# Patient Record
Sex: Female | Born: 1935
Health system: Southern US, Community
[De-identification: ages and names within clinical notes are randomized; demographics above are authoritative.]

## PROBLEM LIST (undated history)

## (undated) DIAGNOSIS — M1990 Unspecified osteoarthritis, unspecified site: Secondary | ICD-10-CM

## (undated) DIAGNOSIS — F329 Major depressive disorder, single episode, unspecified: Secondary | ICD-10-CM

## (undated) DIAGNOSIS — E785 Hyperlipidemia, unspecified: Secondary | ICD-10-CM

## (undated) DIAGNOSIS — M810 Age-related osteoporosis without current pathological fracture: Secondary | ICD-10-CM

## (undated) DIAGNOSIS — S92901A Unspecified fracture of right foot, initial encounter for closed fracture: Secondary | ICD-10-CM

## (undated) DIAGNOSIS — I10 Essential (primary) hypertension: Secondary | ICD-10-CM

## (undated) DIAGNOSIS — M419 Scoliosis, unspecified: Secondary | ICD-10-CM

## (undated) DIAGNOSIS — J309 Allergic rhinitis, unspecified: Secondary | ICD-10-CM

## (undated) DIAGNOSIS — F32A Depression, unspecified: Secondary | ICD-10-CM

## (undated) DIAGNOSIS — Z8739 Personal history of other diseases of the musculoskeletal system and connective tissue: Secondary | ICD-10-CM

## (undated) HISTORY — PX: TUBAL LIGATION: SHX77

## (undated) HISTORY — DX: Scoliosis, unspecified: M41.9

## (undated) HISTORY — DX: Unspecified osteoarthritis, unspecified site: M19.90

## (undated) HISTORY — DX: Unspecified fracture of right foot, initial encounter for closed fracture: S92.901A

## (undated) HISTORY — DX: Hyperlipidemia, unspecified: E78.5

## (undated) HISTORY — DX: Personal history of other diseases of the musculoskeletal system and connective tissue: Z87.39

## (undated) HISTORY — PX: OTHER SURGICAL HISTORY: SHX169

## (undated) HISTORY — DX: Allergic rhinitis, unspecified: J30.9

## (undated) HISTORY — DX: Essential (primary) hypertension: I10

## (undated) HISTORY — DX: Depression, unspecified: F32.A

## (undated) HISTORY — DX: Age-related osteoporosis without current pathological fracture: M81.0

## (undated) HISTORY — DX: Major depressive disorder, single episode, unspecified: F32.9

---

## 1997-12-02 ENCOUNTER — Ambulatory Visit (HOSPITAL_COMMUNITY): Admission: RE | Admit: 1997-12-02 | Discharge: 1997-12-02 | Payer: Self-pay | Admitting: *Deleted

## 1998-01-18 ENCOUNTER — Ambulatory Visit (HOSPITAL_COMMUNITY): Admission: RE | Admit: 1998-01-18 | Discharge: 1998-01-18 | Payer: Self-pay

## 1998-11-24 ENCOUNTER — Ambulatory Visit (HOSPITAL_COMMUNITY): Admission: RE | Admit: 1998-11-24 | Discharge: 1998-11-24 | Payer: Self-pay | Admitting: *Deleted

## 1999-11-30 ENCOUNTER — Encounter: Payer: Self-pay | Admitting: Family Medicine

## 1999-11-30 ENCOUNTER — Ambulatory Visit (HOSPITAL_COMMUNITY): Admission: RE | Admit: 1999-11-30 | Discharge: 1999-11-30 | Payer: Self-pay | Admitting: Family Medicine

## 2000-12-05 ENCOUNTER — Ambulatory Visit (HOSPITAL_COMMUNITY): Admission: RE | Admit: 2000-12-05 | Discharge: 2000-12-05 | Payer: Self-pay | Admitting: *Deleted

## 2001-12-11 ENCOUNTER — Ambulatory Visit (HOSPITAL_COMMUNITY): Admission: RE | Admit: 2001-12-11 | Discharge: 2001-12-11 | Payer: Self-pay | Admitting: Obstetrics & Gynecology

## 2002-12-17 ENCOUNTER — Ambulatory Visit (HOSPITAL_COMMUNITY): Admission: RE | Admit: 2002-12-17 | Discharge: 2002-12-17 | Payer: Self-pay | Admitting: *Deleted

## 2002-12-17 ENCOUNTER — Encounter (INDEPENDENT_AMBULATORY_CARE_PROVIDER_SITE_OTHER): Payer: Self-pay | Admitting: *Deleted

## 2004-03-01 ENCOUNTER — Ambulatory Visit: Payer: Self-pay | Admitting: Family Medicine

## 2004-05-08 ENCOUNTER — Ambulatory Visit: Payer: Self-pay | Admitting: Family Medicine

## 2004-05-09 ENCOUNTER — Ambulatory Visit: Payer: Self-pay | Admitting: Family Medicine

## 2004-06-05 ENCOUNTER — Ambulatory Visit: Payer: Self-pay | Admitting: Family Medicine

## 2004-06-27 ENCOUNTER — Ambulatory Visit: Payer: Self-pay | Admitting: Family Medicine

## 2004-07-19 ENCOUNTER — Ambulatory Visit: Payer: Self-pay | Admitting: Family Medicine

## 2004-08-07 ENCOUNTER — Ambulatory Visit: Payer: Self-pay | Admitting: Family Medicine

## 2004-08-10 ENCOUNTER — Ambulatory Visit: Payer: Self-pay | Admitting: Family Medicine

## 2004-11-06 ENCOUNTER — Ambulatory Visit: Payer: Self-pay | Admitting: Family Medicine

## 2005-02-06 ENCOUNTER — Ambulatory Visit: Payer: Self-pay | Admitting: Family Medicine

## 2005-02-12 ENCOUNTER — Ambulatory Visit: Payer: Self-pay | Admitting: Family Medicine

## 2005-02-20 ENCOUNTER — Ambulatory Visit: Payer: Self-pay | Admitting: Family Medicine

## 2005-06-25 ENCOUNTER — Other Ambulatory Visit: Admission: RE | Admit: 2005-06-25 | Discharge: 2005-06-25 | Payer: Self-pay | Admitting: Family Medicine

## 2005-06-25 ENCOUNTER — Encounter: Payer: Self-pay | Admitting: Family Medicine

## 2005-06-25 ENCOUNTER — Ambulatory Visit: Payer: Self-pay | Admitting: Family Medicine

## 2005-06-25 LAB — CONVERTED CEMR LAB: Pap Smear: NORMAL

## 2005-08-06 ENCOUNTER — Ambulatory Visit: Payer: Self-pay | Admitting: Family Medicine

## 2005-08-27 ENCOUNTER — Ambulatory Visit: Payer: Self-pay | Admitting: Family Medicine

## 2005-08-28 ENCOUNTER — Ambulatory Visit: Payer: Self-pay | Admitting: Family Medicine

## 2005-08-28 LAB — HM MAMMOGRAPHY: HM Mammogram: NORMAL

## 2005-10-08 ENCOUNTER — Ambulatory Visit: Payer: Self-pay | Admitting: Family Medicine

## 2005-11-14 ENCOUNTER — Ambulatory Visit: Payer: Self-pay | Admitting: Family Medicine

## 2005-11-19 ENCOUNTER — Ambulatory Visit: Payer: Self-pay | Admitting: Family Medicine

## 2006-05-13 ENCOUNTER — Ambulatory Visit: Payer: Self-pay | Admitting: Family Medicine

## 2006-05-13 LAB — CONVERTED CEMR LAB
ALT: 32 units/L (ref 0–40)
AST: 28 units/L (ref 0–37)
Cholesterol: 177 mg/dL (ref 0–200)
HDL: 45.3 mg/dL (ref >39.0)
Hgb A1c MFr Bld: 7.2 %
Hgb A1c MFr Bld: 7.2 % — ABNORMAL HIGH (ref 4.6–6.0)
LDL Cholesterol: 113 mg/dL — ABNORMAL HIGH (ref 0–99)
Total CHOL/HDL Ratio: 3.9
Triglycerides: 95 mg/dL (ref 0–149)
VLDL: 19 mg/dL (ref 0–40)

## 2006-05-20 ENCOUNTER — Ambulatory Visit: Payer: Self-pay | Admitting: Family Medicine

## 2006-06-19 ENCOUNTER — Ambulatory Visit: Payer: Self-pay | Admitting: Family Medicine

## 2006-06-19 LAB — CONVERTED CEMR LAB
ALT: 27 units/L (ref 0–40)
AST: 23 units/L (ref 0–37)
Albumin: 3.7 g/dL (ref 3.5–5.2)
Alkaline Phosphatase: 47 units/L (ref 39–117)
Bilirubin, Direct: 0.1 mg/dL (ref 0.0–0.3)
Glucose, Bld: 78 mg/dL (ref 70–99)
Total Bilirubin: 0.7 mg/dL (ref 0.3–1.2)
Total Protein: 6.7 g/dL (ref 6.0–8.3)

## 2006-07-28 ENCOUNTER — Encounter: Payer: Self-pay | Admitting: Family Medicine

## 2006-07-28 DIAGNOSIS — B009 Herpesviral infection, unspecified: Secondary | ICD-10-CM | POA: Insufficient documentation

## 2006-07-28 DIAGNOSIS — M797 Fibromyalgia: Secondary | ICD-10-CM

## 2006-07-28 DIAGNOSIS — IMO0002 Reserved for concepts with insufficient information to code with codable children: Secondary | ICD-10-CM

## 2006-07-28 DIAGNOSIS — I10 Essential (primary) hypertension: Secondary | ICD-10-CM | POA: Insufficient documentation

## 2006-07-28 DIAGNOSIS — M4120 Other idiopathic scoliosis, site unspecified: Secondary | ICD-10-CM | POA: Insufficient documentation

## 2006-07-28 DIAGNOSIS — A6920 Lyme disease, unspecified: Secondary | ICD-10-CM

## 2006-07-28 DIAGNOSIS — F329 Major depressive disorder, single episode, unspecified: Secondary | ICD-10-CM

## 2006-07-28 DIAGNOSIS — E119 Type 2 diabetes mellitus without complications: Secondary | ICD-10-CM

## 2006-07-28 DIAGNOSIS — E785 Hyperlipidemia, unspecified: Secondary | ICD-10-CM | POA: Insufficient documentation

## 2006-07-28 DIAGNOSIS — J45909 Unspecified asthma, uncomplicated: Secondary | ICD-10-CM | POA: Insufficient documentation

## 2006-07-28 DIAGNOSIS — R945 Abnormal results of liver function studies: Secondary | ICD-10-CM

## 2006-07-28 DIAGNOSIS — N318 Other neuromuscular dysfunction of bladder: Secondary | ICD-10-CM

## 2006-07-28 DIAGNOSIS — M790 Rheumatism, unspecified: Secondary | ICD-10-CM | POA: Insufficient documentation

## 2006-07-28 DIAGNOSIS — M1990 Unspecified osteoarthritis, unspecified site: Secondary | ICD-10-CM | POA: Insufficient documentation

## 2006-08-20 ENCOUNTER — Ambulatory Visit: Payer: Self-pay | Admitting: Family Medicine

## 2006-08-20 DIAGNOSIS — T887XXA Unspecified adverse effect of drug or medicament, initial encounter: Secondary | ICD-10-CM

## 2006-08-21 LAB — CONVERTED CEMR LAB
AST: 28 units/L (ref 0–37)
Albumin: 3.7 g/dL (ref 3.5–5.2)
BUN: 8 mg/dL (ref 6–23)
CO2: 31 meq/L (ref 19–32)
Chloride: 109 meq/L (ref 96–112)
Cholesterol: 180 mg/dL (ref 0–200)
Creatinine, Ser: 0.8 mg/dL (ref 0.4–1.2)
GFR calc non Af Amer: 76 mL/min
Hgb A1c MFr Bld: 6.7 % — ABNORMAL HIGH (ref 4.6–6.0)
Phosphorus: 4.3 mg/dL (ref 2.3–4.6)
Total CHOL/HDL Ratio: 4.1
Triglycerides: 103 mg/dL (ref 0–149)

## 2007-02-11 ENCOUNTER — Ambulatory Visit: Payer: Self-pay | Admitting: Family Medicine

## 2007-02-12 LAB — CONVERTED CEMR LAB
AST: 26 units/L (ref 0–37)
Albumin: 4.1 g/dL (ref 3.5–5.2)
BUN: 11 mg/dL (ref 6–23)
Basophils Absolute: 0 10*3/uL (ref 0.0–0.1)
Bilirubin, Direct: 0.2 mg/dL (ref 0.0–0.3)
CO2: 28 meq/L (ref 19–32)
Calcium: 9.9 mg/dL (ref 8.4–10.5)
Chloride: 107 meq/L (ref 96–112)
Cholesterol: 189 mg/dL (ref 0–200)
Creatinine, Ser: 0.8 mg/dL (ref 0.4–1.2)
Eosinophils Absolute: 0.1 10*3/uL (ref 0.0–0.6)
GFR calc non Af Amer: 76 mL/min
HCT: 37.3 % (ref 36.0–46.0)
HDL: 46 mg/dL (ref >39.0)
Hemoglobin: 13 g/dL (ref 12.0–15.0)
Hgb A1c MFr Bld: 6.2 % — ABNORMAL HIGH (ref 4.6–6.0)
Lymphocytes Relative: 26.6 % (ref 12.0–46.0)
MCHC: 34.8 g/dL (ref 30.0–36.0)
MCV: 89.5 fL (ref 78.0–100.0)
Monocytes Absolute: 0.4 10*3/uL (ref 0.2–0.7)
Monocytes Relative: 6.1 % (ref 3.0–11.0)
Neutro Abs: 4.1 10*3/uL (ref 1.4–7.7)
Neutrophils Relative %: 65.7 % (ref 43.0–77.0)
Phosphorus: 3.9 mg/dL (ref 2.3–4.6)

## 2007-02-18 ENCOUNTER — Telehealth: Payer: Self-pay | Admitting: Family Medicine

## 2007-08-12 ENCOUNTER — Ambulatory Visit: Payer: Self-pay | Admitting: Family Medicine

## 2007-08-12 DIAGNOSIS — J309 Allergic rhinitis, unspecified: Secondary | ICD-10-CM | POA: Insufficient documentation

## 2007-08-13 LAB — CONVERTED CEMR LAB
ALT: 30 units/L (ref 0–35)
AST: 29 units/L (ref 0–37)
BUN: 8 mg/dL (ref 6–23)
Calcium: 10.1 mg/dL (ref 8.4–10.5)
Cholesterol: 153 mg/dL (ref 0–200)
GFR calc Af Amer: 91 mL/min
Glucose, Bld: 119 mg/dL — ABNORMAL HIGH (ref 70–99)
HDL: 38.7 mg/dL — ABNORMAL LOW (ref >39.0)
Hgb A1c MFr Bld: 6.7 % — ABNORMAL HIGH (ref 4.6–6.0)
Phosphorus: 3.6 mg/dL (ref 2.3–4.6)
Sodium: 140 meq/L (ref 135–145)
Total Bilirubin: 0.9 mg/dL (ref 0.3–1.2)
Total Protein: 7 g/dL (ref 6.0–8.3)

## 2008-02-12 ENCOUNTER — Ambulatory Visit: Payer: Self-pay | Admitting: Family Medicine

## 2008-02-15 LAB — CONVERTED CEMR LAB
BUN: 9 mg/dL (ref 6–23)
Basophils Absolute: 0 10*3/uL (ref 0.0–0.1)
Bilirubin, Direct: 0.1 mg/dL (ref 0.0–0.3)
CO2: 30 meq/L (ref 19–32)
Chloride: 105 meq/L (ref 96–112)
Eosinophils Absolute: 0.1 10*3/uL (ref 0.0–0.7)
GFR calc Af Amer: 91 mL/min
Glucose, Bld: 121 mg/dL — ABNORMAL HIGH (ref 70–99)
HCT: 38.3 % (ref 36.0–46.0)
MCV: 90.7 fL (ref 78.0–100.0)
Monocytes Absolute: 0.4 10*3/uL (ref 0.1–1.0)
Platelets: 209 10*3/uL (ref 150–400)
Potassium: 4.4 meq/L (ref 3.5–5.1)
RDW: 13 % (ref 11.5–14.6)
TSH: 0.64 microintl units/mL (ref 0.35–5.50)
Total Bilirubin: 0.8 mg/dL (ref 0.3–1.2)

## 2008-06-23 ENCOUNTER — Telehealth: Payer: Self-pay | Admitting: Family Medicine

## 2008-08-09 ENCOUNTER — Ambulatory Visit: Payer: Self-pay | Admitting: Family Medicine

## 2008-08-12 LAB — CONVERTED CEMR LAB
Albumin: 3.9 g/dL (ref 3.5–5.2)
Alkaline Phosphatase: 44 units/L (ref 39–117)
Calcium: 10.1 mg/dL (ref 8.4–10.5)
Cholesterol: 179 mg/dL (ref 0–200)
Glucose, Bld: 110 mg/dL — ABNORMAL HIGH (ref 70–99)
HDL: 50.7 mg/dL (ref >39.00)
Potassium: 4.6 meq/L (ref 3.5–5.1)
Total Protein: 7 g/dL (ref 6.0–8.3)
VLDL: 15.6 mg/dL (ref 0.0–40.0)

## 2009-01-03 ENCOUNTER — Encounter (INDEPENDENT_AMBULATORY_CARE_PROVIDER_SITE_OTHER): Payer: Self-pay | Admitting: *Deleted

## 2009-04-10 ENCOUNTER — Ambulatory Visit: Payer: Self-pay | Admitting: Family Medicine

## 2009-04-11 LAB — CONVERTED CEMR LAB
Albumin: 3.7 g/dL (ref 3.5–5.2)
BUN: 11 mg/dL (ref 6–23)
Cholesterol: 181 mg/dL (ref 0–200)
GFR calc non Af Amer: 87.57 mL/min (ref >60)
Glucose, Bld: 126 mg/dL — ABNORMAL HIGH (ref 70–99)
LDL Cholesterol: 120 mg/dL — ABNORMAL HIGH (ref 0–99)
Phosphorus: 3.7 mg/dL (ref 2.3–4.6)
Total CHOL/HDL Ratio: 4
Triglycerides: 66 mg/dL (ref 0.0–149.0)
VLDL: 13.2 mg/dL (ref 0.0–40.0)

## 2009-07-18 ENCOUNTER — Telehealth: Payer: Self-pay | Admitting: Family Medicine

## 2009-10-04 ENCOUNTER — Ambulatory Visit: Payer: Self-pay | Admitting: Family Medicine

## 2009-10-05 LAB — CONVERTED CEMR LAB
ALT: 27 units/L (ref 0–35)
AST: 29 units/L (ref 0–37)
Albumin: 4.2 g/dL (ref 3.5–5.2)
CO2: 29 meq/L (ref 19–32)
Calcium: 10.6 mg/dL — ABNORMAL HIGH (ref 8.4–10.5)
Chloride: 107 meq/L (ref 96–112)
HDL: 51.3 mg/dL (ref >39.00)
LDL Cholesterol: 119 mg/dL — ABNORMAL HIGH (ref 0–99)
Phosphorus: 4.2 mg/dL (ref 2.3–4.6)
Potassium: 4.7 meq/L (ref 3.5–5.1)
Total CHOL/HDL Ratio: 4
VLDL: 17.4 mg/dL (ref 0.0–40.0)

## 2009-10-16 ENCOUNTER — Ambulatory Visit: Payer: Self-pay | Admitting: Family Medicine

## 2010-01-15 ENCOUNTER — Ambulatory Visit: Payer: Self-pay | Admitting: Family Medicine

## 2010-02-12 ENCOUNTER — Telehealth: Payer: Self-pay | Admitting: Family Medicine

## 2010-03-13 ENCOUNTER — Telehealth: Payer: Self-pay | Admitting: Family Medicine

## 2010-03-26 ENCOUNTER — Telehealth: Payer: Self-pay | Admitting: Family Medicine

## 2010-03-28 ENCOUNTER — Telehealth: Payer: Self-pay | Admitting: Family Medicine

## 2010-03-29 ENCOUNTER — Encounter: Payer: Self-pay | Admitting: Family Medicine

## 2010-03-29 ENCOUNTER — Ambulatory Visit: Payer: Self-pay | Admitting: Family Medicine

## 2010-03-29 DIAGNOSIS — M79609 Pain in unspecified limb: Secondary | ICD-10-CM | POA: Insufficient documentation

## 2010-04-17 ENCOUNTER — Encounter: Payer: Self-pay | Admitting: Family Medicine

## 2010-04-17 ENCOUNTER — Telehealth: Payer: Self-pay | Admitting: Family Medicine

## 2010-05-01 ENCOUNTER — Telehealth: Payer: Self-pay | Admitting: Family Medicine

## 2010-05-12 ENCOUNTER — Encounter: Payer: Self-pay | Admitting: Family Medicine
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
## 2010-05-24 NOTE — Progress Notes (Signed)
Summary: regarding blood sugars  Phone Note Call from Patient Call back at Home Phone [phone]   Caller: Patient Call For: [name] Summary of Call: Pt states the glipizide is not controlling her blood sugar as well as it should.  Her morning readings have been running 179-190's.  Her lunch and evening readings are better.  She thinks she needs something else to take at night to keep her morning readings lower.  Also, she woke up this morning with shortness of breath and right arm pain. This episode lasted about 20 minutes.  Appt made with Dr. Reece Agar to evaluated this on thursday.  Pt knows to go to ER if this happens again. Initial call taken by: Lowella Petties CMA, AAMA,  March 26, 2010 3:20 PM  Follow-up for Phone Call        definitely go to ER if she has these symptoms again  have her please increase her glucotrol xl from 5 mg to 10 mg once daily in am  update me with how sugars are with this increased dose later in the week Follow-up by: [name],  March 26, 2010 4:07 PM  Additional Follow-up for Phone Call Additional follow up Details #1::        Patient notified as instructed by telephone. Lewanda Rife LPN  March 26, 2010 4:33 PM     New/Updated Medications: GLUCOTROL XL 5 MG XR24H-TAB (GLIPIZIDE) 2 by mouth once daily with first meal
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
## 2010-05-24 NOTE — Progress Notes (Signed)
Summary: form for test strips  Phone Note From Pharmacy   Caller: [facility] [address] [phone]* Summary of Call: [facility] has faxed a form that needs to be signed for pt's test strips.  Form is on your shelf. Initial call taken by: Lowella Petties CMA, AAMA,  April 17, 2010 3:42 PM  Follow-up for Phone Call        I am ok with once daily testing instead of two times a day  I changed this on form  Follow-up by: [name],  April 17, 2010 5:14 PM  Additional Follow-up for Phone Call Additional follow up Details #1::        completed form faxed to [phone] as instructed. form sent for scanning.Lewanda Rife LPN  April 18, 2010 2:52 PM

## 2010-05-24 NOTE — Medication Information (Signed)
Summary: Order for Diabetic Supplies  Order for Diabetic Supplies   Imported By: Maryln Gottron 04/24/2010 14:56:40  _____________________________________________________________________  External Attachment:    Type:   Image     Comment:   External Document

## 2010-05-24 NOTE — Progress Notes (Signed)
Summary: documentation needed for diabetic supplies  Phone Note From Pharmacy   [facility]er[facility] Summary of Call: Medicare is asking for documentation for pt's diabetic supplies.  Letter is on your shelf. Initial call taken by: Lowella Petties CMA, AAMA,  March 13, 2010 3:59 PM  Follow-up for Phone Call        please send last 6 mo of prog notes and labs  I put form in nurse IN box- thanks Follow-up by: [name],  March 14, 2010 8:12 AM  Additional Follow-up for Phone Call Additional follow up Details #1::        Printed and given to Rena to send with letter.  Lugene Fuquay CMA Duncan Dull)  March 14, 2010 9:28 AM   Faxed and sent for scanning.  Lugene Fuquay CMA Duncan Dull)  March 14, 2010 9:41 AM

## 2010-05-24 NOTE — Progress Notes (Signed)
Summary: thrush  Phone Note Call from Patient Call back at Home Phone [phone]   Caller: Patient Call For: [name] Summary of Call: Patient says that she has thrush in her mouth and that she has had problems with this in the past. She is asking if she could get magic mouth wash called in to [facility] on [address]  Initial call taken by: Melody Comas,  May 01, 2010 9:39 AM  Follow-up for Phone Call        generally I use nystatin instead of magic mouthwash -- it has the antifungal for thrush .... I will px written on EMR for call in for that , but if she does not want it let me know please follow up if this does not clear up  Follow-up by: [name],  May 01, 2010 10:36 AM  Additional Follow-up for Phone Call Additional follow up Details #1::        Patient notified as instructed by telephone. Pt said she would try Nystatin and if not better would call and make appt. Med sent electronically to [facility] Lewanda Rife LPN  May 01, 2010 5:36 PM     New/Updated Medications: NYSTATIN 100000 UNIT/ML SUSP (NYSTATIN) 1 teaspoon swish and swallow three times a day until thrush is clear Prescriptions: NYSTATIN 100000 UNIT/ML SUSP (NYSTATIN) 1 teaspoon swish and swallow three times a day until thrush is clear  #120cc x 0   Entered by:   Lewanda Rife LPN   Authorized by:   [name]   Signed by:   Lewanda Rife LPN on 14/78/2956   Method used:   Electronically to        [facility] [address] (retail)       [address]       [place], [place]  [address]       Ph: [phone]       Fax: [phone]   RxID:   [id number]

## 2010-05-24 NOTE — Progress Notes (Signed)
Summary: actos   Phone Note Call from Patient   Caller: Patient Call For: Sheila Part MD Summary of Call: Patient has been taking actos for about 3 years and she is now hearing that it can cause bladder cancer. She is asking if there is something else she can take in its place. Please advise. She uses [facility] on [address]  Initial call taken by: Melody Comas,  February 12, 2010 3:11 PM  Follow-up for Phone Call        I reviewed the data and there is a very slt increased risk -- is higher at higher doses and higher in people who have already had bladder cancer FDA is recommending withdrawing med from pts with hx of bladder cancer in past or present if she wants to change to something else - ask if she has ever had glipizide or amaryl or any injectible DM drugs and let me know  Follow-up by: Sheila Part MD,  February 12, 2010 3:38 PM  Additional Follow-up for Phone Call Additional follow up Details #1::        Patient notified as instructed by telephone. Pt wants to stop Actos and try something else. Pt has never taken Glipizide, or Amaryl or any injectible DM drug. Pt has only taken Actos and Metformin. Please advise. Lewanda Rife LPN  February 12, 2010 4:49 PM   lets try glucotrol xl  this can be more likely to cause low sugar so watch out for s/s of hypoglycemia (or high sugars) f/u march as planned px written on EMR for call in  Additional Follow-up by: Sheila Part MD,  February 12, 2010 4:56 PM    Additional Follow-up for Phone Call Additional follow up Details #2::    Patient notified as instructed by telephone. Medication phoned to [facility] pharmacy as instructed. Lewanda Rife LPN  February 12, 2010 5:07 PM   New/Updated Medications: GLUCOTROL XL 5 MG XR24H-TAB (GLIPIZIDE) 1 by mouth once daily with first meal  Prescriptions: GLUCOTROL XL 5 MG XR24H-TAB (GLIPIZIDE) 1 by mouth once daily with first meal  #30 x 11   Entered and Authorized by:   Sheila Part MD   Signed by:   Lewanda Rife LPN on 16/01/9603   Method used:   Telephoned to ...       [facility] [address]* (retail)       [address]       [place], [place]  [address]       Ph: [phone]       Fax: [phone]   RxID:   [id number]

## 2010-05-24 NOTE — Progress Notes (Signed)
Summary: form regarding diabetic supplies  Phone Note From Pharmacy   Caller: [facility] [address] [phone]* Summary of Call: [name] is asking for documentation regarding pt's diabetic supplies.  Form is on your shelf. Initial call taken by: Lowella Petties CMA,  July 18, 2009 11:32 AM  Follow-up for Phone Call        please send last 3 progress notes I put form in nurse in box  Follow-up by: [name],  July 18, 2009 1:21 PM  Additional Follow-up for Phone Call Additional follow up Details #1::        Faxed last three office notes including fax cover page, faxed a total of 20 pages including cover page to [phone]. Additional Follow-up by: Linde Gillis CMA Duncan Dull),  July 18, 2009 2:15 PM
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
## 2010-05-24 NOTE — Progress Notes (Signed)
Summary: documentation for diabetic supplies  Phone Note From Pharmacy   Caller: Patient Caller: [facility]  Summary of Call: Pharmacy is asking for more documentation for diabetic testing supplies.  Form is on your shelf. Initial call taken by: Lowella Petties CMA, AAMA,  March 28, 2010 9:25 AM  Follow-up for Phone Call        please send last 2 prog notes and last 2 phone notes and last labs  thanks  will put form back in IN box Follow-up by: [name],  March 28, 2010 9:37 AM  Additional Follow-up for Phone Call Additional follow up Details #1::        Faxed requested info to [phone] as instructed.Lewanda Rife LPN  March 29, 2010 3:20 PM

## 2010-05-30 ENCOUNTER — Encounter: Payer: Self-pay | Admitting: Family Medicine

## 2010-05-30 DIAGNOSIS — I10 Essential (primary) hypertension: Secondary | ICD-10-CM

## 2010-05-30 DIAGNOSIS — M419 Scoliosis, unspecified: Secondary | ICD-10-CM

## 2010-05-30 DIAGNOSIS — E119 Type 2 diabetes mellitus without complications: Secondary | ICD-10-CM | POA: Insufficient documentation

## 2010-05-30 DIAGNOSIS — J309 Allergic rhinitis, unspecified: Secondary | ICD-10-CM

## 2010-05-30 DIAGNOSIS — J45909 Unspecified asthma, uncomplicated: Secondary | ICD-10-CM | POA: Insufficient documentation

## 2010-05-30 DIAGNOSIS — M1990 Unspecified osteoarthritis, unspecified site: Secondary | ICD-10-CM

## 2010-05-30 DIAGNOSIS — E785 Hyperlipidemia, unspecified: Secondary | ICD-10-CM

## 2010-05-30 DIAGNOSIS — F329 Major depressive disorder, single episode, unspecified: Secondary | ICD-10-CM

## 2010-05-30 DIAGNOSIS — F32A Depression, unspecified: Secondary | ICD-10-CM

## 2010-07-03 ENCOUNTER — Encounter (INDEPENDENT_AMBULATORY_CARE_PROVIDER_SITE_OTHER): Payer: Self-pay | Admitting: *Deleted

## 2010-07-03 ENCOUNTER — Other Ambulatory Visit: Payer: Self-pay | Admitting: Family Medicine

## 2010-07-03 ENCOUNTER — Other Ambulatory Visit (INDEPENDENT_AMBULATORY_CARE_PROVIDER_SITE_OTHER): Payer: Medicare Other

## 2010-07-03 DIAGNOSIS — E119 Type 2 diabetes mellitus without complications: Secondary | ICD-10-CM

## 2010-07-03 DIAGNOSIS — E785 Hyperlipidemia, unspecified: Secondary | ICD-10-CM

## 2010-07-03 DIAGNOSIS — I10 Essential (primary) hypertension: Secondary | ICD-10-CM

## 2010-07-03 LAB — LIPID PANEL
Cholesterol: 185 mg/dL (ref 0–200)
LDL Cholesterol: 121 mg/dL — ABNORMAL HIGH (ref 0–99)
Triglycerides: 101 mg/dL (ref 0.0–149.0)
VLDL: 20.2 mg/dL (ref 0.0–40.0)

## 2010-07-03 LAB — RENAL FUNCTION PANEL
Albumin: 4.2 g/dL (ref 3.5–5.2)
BUN: 11 mg/dL (ref 6–23)
Glucose, Bld: 139 mg/dL — ABNORMAL HIGH (ref 70–99)
Phosphorus: 3.9 mg/dL (ref 2.3–4.6)

## 2010-07-13 ENCOUNTER — Other Ambulatory Visit: Payer: Self-pay

## 2010-07-18 ENCOUNTER — Encounter: Payer: Self-pay | Admitting: Family Medicine

## 2010-07-18 ENCOUNTER — Ambulatory Visit (INDEPENDENT_AMBULATORY_CARE_PROVIDER_SITE_OTHER): Payer: Medicare Other | Admitting: Family Medicine

## 2010-07-18 DIAGNOSIS — E119 Type 2 diabetes mellitus without complications: Secondary | ICD-10-CM

## 2010-07-18 DIAGNOSIS — I10 Essential (primary) hypertension: Secondary | ICD-10-CM

## 2010-07-18 DIAGNOSIS — F329 Major depressive disorder, single episode, unspecified: Secondary | ICD-10-CM

## 2010-07-18 DIAGNOSIS — E785 Hyperlipidemia, unspecified: Secondary | ICD-10-CM

## 2010-07-18 NOTE — Assessment & Plan Note (Signed)
Though elavil sedates a bit during the day -- will continue as it helps with mood and sleep  Disc stressors

## 2010-07-18 NOTE — Patient Instructions (Addendum)
Keep working on low sugar/ starch diet Also low sat fat (Avoid red meat/ fried foods/ egg yolks/ fatty breakfast meats/ butter, cheese and high fat dairy/ and shellfish  ) Stay active If you could do some water exercise- that would be helpful  No change in medicines  Follow up in 6 months with labs prior  Don't forget to make your annual eye doctor appointment with Dr Dellie Burns

## 2010-07-18 NOTE — Assessment & Plan Note (Signed)
Well controlled on current med without change Cannot exercise due to oa pain - so enc to stay active

## 2010-07-18 NOTE — Assessment & Plan Note (Signed)
A little up with LDL 121  Rev diet- fair Pt refuses chol med in any form Counseled on low sat fat diet

## 2010-07-18 NOTE — Assessment & Plan Note (Signed)
Stable on metformin and glipizide  Diet fair Rev sugars -- pp a little high AIC is stable Will sched opthy  On ace Lab and f/u 6 mo  Unfortunately pt unable to exercise

## 2010-07-18 NOTE — Progress Notes (Signed)
Subjective:    Patient ID: [name], female    DOB: [birth year], 75 y.o.   MRN: [id number]  HPI Here for f/u of DM and lipids and HTN and depression   Wt is up 2 lb at 155-- heavy keys in pocket- does not think she gained   bp in good control at 126/68 -- is really good  No problems with meds No headaches or other symptoms  DM is stable with AIC 6.4 (up from 6.3)  On glipizide and metformin- no side eff or problems  Sugars overall low 100s fasting and up after meals - doing better with these meds/ no hypoglycemia  No side effects  Diet-- is fair - really avoids sweets , and trying to limit the starches  Exercise - is limited due to chronic pain / does stay active -- 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 Asthma     childhood  . Depression   . Scoliosis     chronic back pain  . Diabetes mellitus     Type II  . HLD (hyperlipidemia)   . HTN (hypertension)   . Osteoarthritis     hands,elbow,knees  . Rhinitis, allergic     Past Surgical History  Procedure Date  . Tubal ligation     bilateral  . Bladder tack     History   Social History  . Marital Status: Widowed    Spouse Name: N/A    Number of Children: N/A  . Years of Education: N/A   Occupational History  . Not on file.   Social History Main Topics  . Smoking status: Never Smoker   . Smokeless tobacco: Not on file  . Alcohol Use: No  . Drug Use: Not on file  . Sexually Active: Not on file   Other Topics Concern  .  Not on file   Social History Narrative  . No narrative on file    Family History  Problem Relation Age of Onset  . Coronary artery disease Mother   . Cancer Mother     thyroid  . Coronary artery disease Father   . Multiple sclerosis Sister           Review of Systems  Constitutional: Positive for activity change and fatigue. Negative for unexpected weight change.  Eyes: Negative for itching and visual disturbance.  Respiratory: Negative.   Cardiovascular: Negative.   Gastrointestinal: Negative for nausea, vomiting, abdominal pain, diarrhea and constipation.  Genitourinary: Negative for dysuria and frequency.  Musculoskeletal: Positive for back pain and arthralgias. Negative for myalgias, joint swelling and gait problem.  Skin: Negative for pallor and rash.  Neurological: Negative for weakness, light-headedness and headaches.  Hematological: Does not bruise/bleed easily.  Psychiatric/Behavioral: Positive for dysphoric mood. The patient is not nervous/anxious.        Objective:   Physical Exam  Constitutional: She  appears well-nourished. No distress.       overwt and well appearing   HENT:  Head: Normocephalic and atraumatic.  Eyes: Conjunctivae and EOM are normal. Pupils are equal, round, and reactive to light.  Neck: Normal range of motion. Neck supple. No JVD present. Carotid bruit is not present. No thyromegaly present.  Cardiovascular: Normal rate and regular rhythm.   Pulmonary/Chest: Effort normal and breath sounds normal.  Abdominal: Soft. Bowel sounds are normal. She exhibits no abdominal bruit.  Musculoskeletal: She exhibits tenderness. She exhibits no edema.       Poor rom of knees/ hips and back  Lymphadenopathy:    She has no cervical adenopathy.  Neurological: She is alert. She has normal reflexes. She exhibits normal muscle tone.  Skin: Skin is warm and dry.  Psychiatric:       Generally negative but affect seems normal          Assessment &  Plan:

## 2010-08-31 ENCOUNTER — Other Ambulatory Visit: Payer: Self-pay | Admitting: *Deleted

## 2010-08-31 MED ORDER — GLIPIZIDE ER 5 MG PO TB24: 10.0000 mg | ORAL_TABLET | Freq: Every day | ORAL | Status: DC

## 2011-01-11 ENCOUNTER — Ambulatory Visit: Payer: Medicare Other

## 2011-01-11 ENCOUNTER — Other Ambulatory Visit (INDEPENDENT_AMBULATORY_CARE_PROVIDER_SITE_OTHER): Payer: Medicare Other

## 2011-01-11 DIAGNOSIS — I10 Essential (primary) hypertension: Secondary | ICD-10-CM

## 2011-01-11 DIAGNOSIS — E119 Type 2 diabetes mellitus without complications: Secondary | ICD-10-CM

## 2011-01-11 LAB — RENAL FUNCTION PANEL
Albumin: 4.3 g/dL (ref 3.5–5.2)
CO2: 23 mEq/L (ref 19–32)
Creat: 0.81 mg/dL (ref 0.50–1.10)
Phosphorus: 3.4 mg/dL (ref 2.3–4.6)
Sodium: 140 mEq/L (ref 135–145)

## 2011-01-18 ENCOUNTER — Encounter: Payer: Self-pay | Admitting: Family Medicine

## 2011-01-18 ENCOUNTER — Ambulatory Visit (INDEPENDENT_AMBULATORY_CARE_PROVIDER_SITE_OTHER): Payer: Medicare Other | Admitting: Family Medicine

## 2011-01-18 VITALS — BP 122/64 | HR 96 | Temp 98.4°F | Ht 61.0 in | Wt 150.8 lb

## 2011-01-18 DIAGNOSIS — R8290 Unspecified abnormal findings in urine: Secondary | ICD-10-CM

## 2011-01-18 DIAGNOSIS — I10 Essential (primary) hypertension: Secondary | ICD-10-CM

## 2011-01-18 DIAGNOSIS — E785 Hyperlipidemia, unspecified: Secondary | ICD-10-CM

## 2011-01-18 DIAGNOSIS — E119 Type 2 diabetes mellitus without complications: Secondary | ICD-10-CM

## 2011-01-18 DIAGNOSIS — R82998 Other abnormal findings in urine: Secondary | ICD-10-CM

## 2011-01-18 LAB — POCT URINALYSIS DIPSTICK
Ketones, UA: NEGATIVE
Nitrite, UA: NEGATIVE
Protein, UA: NEGATIVE
Urobilinogen, UA: 0.2
pH, UA: 7

## 2011-01-18 LAB — POCT UA - MICROSCOPIC ONLY
Casts, Ur, LPF, POC: 0
WBC, Ur, HPF, POC: 0
Yeast, UA: 0

## 2011-01-18 NOTE — Assessment & Plan Note (Signed)
bp in good control  No side eff/ doing well  No change in tx Rev labs Lab and f/u check up 6 mo

## 2011-01-18 NOTE — Patient Instructions (Addendum)
Blood pressure is good  Sugar control is good too  No change in medicines  If you decide you would like to have physical therapy for balance to prevent falls- please let me know  Schedule follow up for 30 minute check up in 6 months with labs prior  Urine is clear today  Make sure to get your eye doctor appt

## 2011-01-18 NOTE — Progress Notes (Signed)
Subjective:    Patient ID: [name], female    DOB: Oct 29, 1935, 75 y.o.   MRN: [id number]  HPI Pt here for f/u of DM and HTN and odor of urine  Wt is down 5 lb with bmi of 28 Not much appetite   a1c is 6.3 On ace  Glipizide/ metformin Diet- eating the right things when she does  Exercise-- has been "too active" - taking care of her disabled sister - scoliosis - has to lift her  This has made her hurt a lot more - muscle strain and plantar fasciitis  No help at all   bp 122/64- no problems On ace  No side eff No cp or headache or palpitations No notable edema   Urine odor- smells funny/ medicinal - ua neg today  ? Diet or vitamin related  No dysuria or frequency or urgency / or blood   occ gets yeast rash under pannus -- uses otc antifungal powder and that helps   Patient Active Problem List  Diagnoses  . HERPES SIMPLEX INFECTION  . LYME DISEASE  . DIABETES MELLITUS, TYPE II  . HYPERLIPIDEMIA  . DEPRESSION  . HYPERTENSION  . ALLERGIC RHINITIS  . ASTHMA  . OVERACTIVE BLADDER  . OSTEOARTHRITIS  . DEGENERATIVE DISC DISEASE  . FIBROSITIS  . ARM PAIN, RIGHT  . SCOLIOSIS  . LIVER FUNCTION TESTS, ABNORMAL  . ADVEF, DRUG/MEDICINAL/BIOLOGICAL SUBST NOS  . Scoliosis   Past Medical History  Diagnosis Date  . Asthma     childhood  . Depression   . Scoliosis     chronic back pain  . Diabetes mellitus     Type II  . HLD (hyperlipidemia)   . HTN (hypertension)   . Osteoarthritis     hands,elbow,knees  . Rhinitis, allergic    Past Surgical History  Procedure Date  . Tubal ligation     bilateral  . Bladder tack    History  Substance Use Topics  . Smoking status: Never Smoker   . Smokeless tobacco: Not on file  . Alcohol Use: No   Family History  Problem Relation Age of Onset  . Coronary artery disease Mother   . Cancer Mother     thyroid  . Coronary artery disease Father   . Multiple sclerosis Sister    Allergies  Allergen Reactions  .  Acetaminophen     REACTION: nausea  . Codeine     REACTION: itching  . Fluticasone Propionate     REACTION: palpitations  . Naproxen     REACTION: GI  . Nsaids     REACTION: elevated LFT's  . Penicillins     REACTION: swelling at inj site   Current Outpatient Prescriptions on File Prior to Visit  Medication Sig Dispense Refill  . amitriptyline (ELAVIL) 25 MG tablet Take 25 mg by mouth at bedtime.        Marland Kitchen amLODipine (NORVASC) 10 MG tablet Take 10 mg by mouth daily.        Marland Kitchen aspirin 81 MG tablet Take 81 mg by mouth daily.        . Calcium Carbonate-Vitamin D (CALCIUM 600+D) 600-400 MG-UNIT per tablet Take 2 tablets by mouth daily.        . fish oil-omega-3 fatty acids 1000 MG capsule Take 1 capsule by mouth daily.        Marland Kitchen glipiZIDE (GLUCOTROL) 5 MG 24 hr tablet Take 2 tablets (10 mg total) by mouth daily.  60 tablet  10  . glucose blood test strip 1 each by Other route as needed. Use as instructed. Check sugar once daily and as needed for DM 250.0       . Lancets 28G MISC by Does not apply route. Check sugar once daily and as needed for DM 250.0       . metFORMIN (GLUCOPHAGE) 500 MG tablet 2 every morning, 1 at lunch and 1 at supper      . Multiple Vitamin (MULTIVITAMIN) tablet Take 1 tablet by mouth daily.        . quinapril (ACCUPRIL) 40 MG tablet Take 40 mg by mouth at bedtime.        . fluticasone (FLONASE) 50 MCG/ACT nasal spray 2 sprays by Nasal route daily as needed.        . nystatin (MYCOSTATIN) 100000 UNIT/ML suspension Take 500,000 Units by mouth 3 (three) times daily as needed.           Review of Systems Review of Systems  Constitutional: Negative for fever, appetite change, fatigue and unexpected weight change.  Eyes: Negative for pain and visual disturbance.  Respiratory: Negative for cough and shortness of breath.   Cardiovascular: Negative for cp or palpitations    Gastrointestinal: Negative for nausea, diarrhea and constipation.  Genitourinary: Negative for  urgency and frequency. neg for back pain or nausea  Skin: Negative for pallor or rash   Neurological: Negative for weakness, light-headedness, numbness and headaches.  Hematological: Negative for adenopathy. Does not bruise/bleed easily.  Psychiatric/Behavioral: Negative for dysphoric mood. The patient is not nervous/anxious.          Objective:   Physical Exam  Constitutional: She appears well-developed and well-nourished. No distress.       overwt and well appearing   HENT:  Head: Normocephalic and atraumatic.  Eyes: Conjunctivae and EOM are normal. Pupils are equal, round, and reactive to light.  Neck: Normal range of motion. Neck supple. No JVD present. Carotid bruit is not present. No thyromegaly present.  Cardiovascular: Normal rate, regular rhythm, normal heart sounds and intact distal pulses.   Pulmonary/Chest: Effort normal and breath sounds normal. No respiratory distress. She has no wheezes.  Abdominal: Soft. Bowel sounds are normal. She exhibits no distension, no abdominal bruit and no mass. There is no tenderness.  Musculoskeletal: Normal range of motion. She exhibits no edema and no tenderness.       No cva tenderness   Lymphadenopathy:    She has no cervical adenopathy.  Neurological: She is alert. She has normal reflexes. No cranial nerve deficit. Coordination normal.  Skin: Skin is warm and dry. No rash noted. No erythema. No pallor.  Psychiatric: She has a normal mood and affect.          Assessment & Plan:

## 2011-01-18 NOTE — Assessment & Plan Note (Signed)
Will check for next visit  Pt declines tx for this  Rev low sat fat diet

## 2011-01-18 NOTE — Assessment & Plan Note (Signed)
Good control with diet and exercise Wt loss is good  Metformin and glipizide - no changes  F/u 6 months  Needs to set up her own eye exam

## 2011-01-23 ENCOUNTER — Other Ambulatory Visit: Payer: Self-pay | Admitting: Family Medicine

## 2011-01-30 ENCOUNTER — Other Ambulatory Visit: Payer: Self-pay

## 2011-01-30 MED ORDER — METFORMIN HCL 500 MG PO TABS: ORAL_TABLET | ORAL | Status: DC

## 2011-01-30 NOTE — Telephone Encounter (Signed)
[facility] faxed refill request Metformin 500 mg #360 x1.

## 2011-04-12 ENCOUNTER — Other Ambulatory Visit: Payer: Self-pay | Admitting: Family Medicine

## 2011-04-12 MED ORDER — QUINAPRIL HCL 40 MG PO TABS: 40.0000 mg | ORAL_TABLET | Freq: Every day | ORAL | Status: DC

## 2011-04-12 NOTE — Telephone Encounter (Signed)
Quinapril 40 mg #30 x 11 sent electronically to [facility]. Pt last seen 01/18/11.

## 2011-04-12 NOTE — Telephone Encounter (Signed)
Brett Canales with [facility] in [place] called instead of faxing refill request because he got it [facility] a transfer [facility] and did not have the request to fax in.  He is calling for a refill on Quinapril for patient.  It was last refilled on 9/24.  Please call into pharmacy at [phone].

## 2011-04-29 ENCOUNTER — Other Ambulatory Visit: Payer: Self-pay | Admitting: Family Medicine

## 2011-04-29 NOTE — Telephone Encounter (Signed)
Elect

## 2011-06-21 DIAGNOSIS — S92901A Unspecified fracture of right foot, initial encounter for closed fracture: Secondary | ICD-10-CM

## 2011-06-21 HISTORY — DX: Unspecified fracture of right foot, initial encounter for closed fracture: S92.901A

## 2011-07-12 ENCOUNTER — Telehealth: Payer: Self-pay | Admitting: Family Medicine

## 2011-07-12 ENCOUNTER — Other Ambulatory Visit (INDEPENDENT_AMBULATORY_CARE_PROVIDER_SITE_OTHER): Payer: Medicare Other

## 2011-07-12 DIAGNOSIS — I10 Essential (primary) hypertension: Secondary | ICD-10-CM

## 2011-07-12 DIAGNOSIS — E119 Type 2 diabetes mellitus without complications: Secondary | ICD-10-CM

## 2011-07-12 DIAGNOSIS — E785 Hyperlipidemia, unspecified: Secondary | ICD-10-CM | POA: Diagnosis not present

## 2011-07-12 LAB — COMPREHENSIVE METABOLIC PANEL
ALT: 49 U/L — ABNORMAL HIGH (ref 0–35)
Albumin: 4 g/dL (ref 3.5–5.2)
BUN: 14 mg/dL (ref 6–23)
CO2: 27 mEq/L (ref 19–32)
Calcium: 10.1 mg/dL (ref 8.4–10.5)
Chloride: 101 mEq/L (ref 96–112)
Creatinine, Ser: 0.9 mg/dL (ref 0.4–1.2)
GFR: 67.71 mL/min (ref >60.00)

## 2011-07-12 LAB — CBC WITH DIFFERENTIAL/PLATELET
Basophils Absolute: 0 10*3/uL (ref 0.0–0.1)
Basophils Relative: 0.8 % (ref 0.0–3.0)
Hemoglobin: 12.8 g/dL (ref 12.0–15.0)
Lymphocytes Relative: 32.6 % (ref 12.0–46.0)
Monocytes Relative: 7.2 % (ref 3.0–12.0)
Neutro Abs: 3.6 10*3/uL (ref 1.4–7.7)
RBC: 4.42 Mil/uL (ref 3.87–5.11)

## 2011-07-12 LAB — LIPID PANEL
Total CHOL/HDL Ratio: 4
Triglycerides: 86 mg/dL (ref 0.0–149.0)

## 2011-07-12 LAB — TSH: TSH: 0.77 u[IU]/mL (ref 0.35–5.50)

## 2011-07-12 NOTE — Telephone Encounter (Signed)
Message copied by Judy Pimple on Fri Jul 12, 2011  9:00 AM ------      Message from: Mills Koller J      Created: Wed Jul 03, 2011  4:35 PM      Regarding: labs for 07-12-11       6 month f/u labs , thanks, T

## 2011-07-15 DIAGNOSIS — M7750 Other enthesopathy of unspecified foot: Secondary | ICD-10-CM | POA: Diagnosis not present

## 2011-07-15 DIAGNOSIS — E119 Type 2 diabetes mellitus without complications: Secondary | ICD-10-CM | POA: Diagnosis not present

## 2011-07-15 DIAGNOSIS — M66879 Spontaneous rupture of other tendons, unspecified ankle and foot: Secondary | ICD-10-CM | POA: Diagnosis not present

## 2011-07-19 ENCOUNTER — Ambulatory Visit: Payer: Medicare Other | Admitting: Family Medicine

## 2011-07-22 ENCOUNTER — Telehealth: Payer: Self-pay | Admitting: *Deleted

## 2011-07-22 ENCOUNTER — Other Ambulatory Visit: Payer: Self-pay | Admitting: *Deleted

## 2011-07-22 MED ORDER — GLIPIZIDE ER 5 MG PO TB24: 10.0000 mg | ORAL_TABLET | Freq: Every day | ORAL | Status: DC

## 2011-07-22 MED ORDER — QUINAPRIL HCL 40 MG PO TABS: 40.0000 mg | ORAL_TABLET | Freq: Every day | ORAL | Status: DC

## 2011-07-22 NOTE — Telephone Encounter (Signed)
Received faxed refill request from pharmacy requesting to change patient's Quinapril from a 30 day supply to a 90 day supply. Pharmacy advised that it is okay to change this to a 90 day supply.

## 2011-07-22 NOTE — Telephone Encounter (Signed)
Received faxed refill request from pharmacy requesting to change patient's Glipizide to a 90 day supply. Is this okay?

## 2011-07-22 NOTE — Telephone Encounter (Signed)
That is fine - can refil both for a year  Will refill electronically

## 2011-07-24 ENCOUNTER — Ambulatory Visit (INDEPENDENT_AMBULATORY_CARE_PROVIDER_SITE_OTHER): Payer: Medicare Other | Admitting: Family Medicine

## 2011-07-24 ENCOUNTER — Encounter: Payer: Self-pay | Admitting: Family Medicine

## 2011-07-24 VITALS — BP 130/62 | HR 96 | Temp 97.8°F | Ht 61.0 in | Wt 154.0 lb

## 2011-07-24 DIAGNOSIS — I10 Essential (primary) hypertension: Secondary | ICD-10-CM

## 2011-07-24 DIAGNOSIS — E119 Type 2 diabetes mellitus without complications: Secondary | ICD-10-CM | POA: Diagnosis not present

## 2011-07-24 DIAGNOSIS — E785 Hyperlipidemia, unspecified: Secondary | ICD-10-CM | POA: Diagnosis not present

## 2011-07-24 MED ORDER — ZOSTER VACCINE LIVE 19400 UNT/0.65ML ~~LOC~~ SOLR: 0.6500 mL | Freq: Once | SUBCUTANEOUS | Status: AC

## 2011-07-24 NOTE — Assessment & Plan Note (Signed)
Overall just a bit worse with dec in activity  No change in med Will work on low glycemic diet  Will make own eye exam

## 2011-07-24 NOTE — Assessment & Plan Note (Signed)
bp in fair control at this time  No changes needed  Disc lifstyle change with low sodium diet and exercise  Rev labs  F/u 6 mo   

## 2011-07-24 NOTE — Patient Instructions (Signed)
Make sure to get your annual eye exam with Dr Dellie Burns when you can Try to stick to a diabetic diet  If your insurance covers a shingles vaccine - here is a px to get it at a pharmacy-- [facility] next door will give it to you Good luck with the foot  Follow up in 6 months for annual exam with labs prior Keep taking your calcium and vitamin D

## 2011-07-24 NOTE — Assessment & Plan Note (Signed)
This is stable with diet control Disc goals for lipids and reasons to control them Rev labs with pt Rev low sat fat diet in detail

## 2011-07-24 NOTE — Progress Notes (Signed)
Subjective:    Patient ID: [name], female    DOB: [birth year], 76 y.o.   MRN: [id number]  HPI Here for f/u of chronic conditions  Broke her foot last weekend - tripped over some toys at a yard sale - ankle turned and broke 5 th metatarsal  Is in a boot and doing ok -- also tore the tendon  Is taking her ca and vit D  Is not wt bearing at this time In wheelchair today / crutches  Her boyfriend has been taking care of her   bp is  130/62   Today No cp or palpitations or headaches or edema  No side effects to medicines    Wt is up 4 lb with bmi of 29  ast /alt up a bit - this is chronic  Has not taken any pain med   Diabetes Home sugar results - are very good and no changes  DM diet - perhaps too much milk , does watch her sugar pretty closely , less appetite as she gets older  Exercise - was doing well before she hurt herself   (is generally very active) Symptoms A1C last 6.8 up from 6.3 No problems with medications  Renal protection on ace Last eye exam - been at least 2 years -Dr Dellie Burns  Zoster status- thinks she should have one  Flu shot - got it in the fall   Patient Active Problem List  Diagnoses  . HERPES SIMPLEX INFECTION  . LYME DISEASE  . DIABETES MELLITUS, TYPE II  . HYPERLIPIDEMIA  . DEPRESSION  . HYPERTENSION  . ALLERGIC RHINITIS  . ASTHMA  . OVERACTIVE BLADDER  . OSTEOARTHRITIS  . DEGENERATIVE DISC DISEASE  . FIBROSITIS  . ARM PAIN, RIGHT  . SCOLIOSIS  . LIVER FUNCTION TESTS, ABNORMAL  . ADVEF, DRUG/MEDICINAL/BIOLOGICAL SUBST NOS  . Scoliosis   Past Medical History  Diagnosis Date  . Asthma     childhood  . Depression   . Scoliosis     chronic back pain  . Diabetes mellitus     Type II  . HLD (hyperlipidemia)   . HTN (hypertension)   . Osteoarthritis     hands,elbow,knees  . Rhinitis, allergic   . Foot fracture, right 3/13   Past Surgical History  Procedure Date  . Tubal ligation     bilateral  . Bladder tack     History  Substance Use Topics  . Smoking status: Never Smoker   . Smokeless tobacco: Not on file  . Alcohol Use: No   Family History  Problem Relation Age of Onset  . Coronary artery disease Mother   . Cancer Mother     thyroid  . Coronary artery disease Father   . Multiple sclerosis Sister    Allergies  Allergen Reactions  . Acetaminophen     REACTION: nausea  . Codeine     REACTION: itching  . Fluticasone Propionate     REACTION: palpitations  . Naproxen     REACTION: GI  . Nsaids     REACTION: elevated LFT's  . Penicillins     REACTION: swelling at inj site   Current Outpatient Prescriptions on File Prior to Visit  Medication Sig Dispense Refill  . amitriptyline (ELAVIL) 25 MG tablet TAKE  ONE TABLET BY MOUTH NIGHTLY AT BEDTIME  30 tablet  11  . amLODipine (NORVASC) 10 MG tablet TAKE ONE TABLET BY MOUTH EVERY DAY  30 tablet  10  . aspirin 81  MG tablet Take 81 mg by mouth daily.        . Calcium Carbonate-Vitamin D (CALCIUM 600+D) 600-400 MG-UNIT per tablet Take 2 tablets by mouth daily.        . fish oil-omega-3 fatty acids 1000 MG capsule Take 1 capsule by mouth daily.        Marland Kitchen glipiZIDE (GLUCOTROL XL) 5 MG 24 hr tablet Take 2 tablets (10 mg total) by mouth daily.  90 tablet  3  . glucose blood test strip 1 each by Other route as needed. Use as instructed. Check sugar once daily and as needed for DM 250.0       . Ibuprofen-Diphenhydramine Cit (ADVIL PM PO) Take by mouth as directed.        . Lancets 28G MISC by Does not apply route. Check sugar once daily and as needed for DM 250.0       . metFORMIN (GLUCOPHAGE) 500 MG tablet 2 every morning, 1 at lunch and 1 at supper  360 tablet  1  . Multiple Vitamin (MULTIVITAMIN) tablet Take 1 tablet by mouth daily.        . quinapril (ACCUPRIL) 40 MG tablet Take 1 tablet (40 mg total) by mouth at bedtime.  90 tablet  3  . nystatin (MYCOSTATIN) 100000 UNIT/ML suspension Take 500,000 Units by mouth 3 (three) times daily as  needed.              Review of Systems Review of Systems  Constitutional: Negative for fever, appetite change, fatigue and unexpected weight change.  Eyes: Negative for pain and visual disturbance.  Respiratory: Negative for cough and shortness of breath.   Cardiovascular: Negative for cp or palpitations    Gastrointestinal: Negative for nausea, diarrhea and constipation.  Genitourinary: Negative for urgency and frequency.  Skin: Negative for pallor or rash   Neurological: Negative for weakness, light-headedness, numbness and headaches.  Hematological: Negative for adenopathy. Does not bruise/bleed easily.  Psychiatric/Behavioral: Negative for dysphoric mood. The patient is not nervous/anxious.          Objective:   Physical Exam  Constitutional: She appears well-developed and well-nourished. No distress.  HENT:  Head: Normocephalic and atraumatic.  Mouth/Throat: Oropharynx is clear and moist.  Eyes: Conjunctivae and EOM are normal. Pupils are equal, round, and reactive to light. No scleral icterus.  Neck: Normal range of motion. Neck supple. No JVD present. Carotid bruit is not present. No thyromegaly present.  Cardiovascular: Normal rate, regular rhythm, normal heart sounds and intact distal pulses.  Exam reveals no gallop.   Pulmonary/Chest: Effort normal and breath sounds normal. No respiratory distress. She has no wheezes.  Abdominal: Soft. Bowel sounds are normal. She exhibits no distension, no abdominal bruit and no mass. There is no tenderness.  Musculoskeletal: She exhibits no edema and no tenderness.  Lymphadenopathy:    She has no cervical adenopathy.  Neurological: She is alert. She has normal reflexes. No cranial nerve deficit. Coordination normal.  Skin: Skin is warm and dry. No rash noted. No erythema. No pallor.  Psychiatric: She has a normal mood and affect.          Assessment & Plan:

## 2011-08-05 DIAGNOSIS — M66879 Spontaneous rupture of other tendons, unspecified ankle and foot: Secondary | ICD-10-CM | POA: Diagnosis not present

## 2011-08-16 ENCOUNTER — Other Ambulatory Visit: Payer: Self-pay | Admitting: Family Medicine

## 2011-09-02 DIAGNOSIS — M66879 Spontaneous rupture of other tendons, unspecified ankle and foot: Secondary | ICD-10-CM | POA: Diagnosis not present

## 2011-09-11 ENCOUNTER — Ambulatory Visit (INDEPENDENT_AMBULATORY_CARE_PROVIDER_SITE_OTHER): Payer: Medicare Other | Admitting: Family Medicine

## 2011-09-11 ENCOUNTER — Encounter: Payer: Self-pay | Admitting: Family Medicine

## 2011-09-11 ENCOUNTER — Telehealth: Payer: Self-pay | Admitting: Family Medicine

## 2011-09-11 VITALS — BP 140/70 | HR 106 | Temp 98.4°F | Wt 153.0 lb

## 2011-09-11 DIAGNOSIS — R000 Tachycardia, unspecified: Secondary | ICD-10-CM | POA: Diagnosis not present

## 2011-09-11 LAB — CBC WITH DIFFERENTIAL/PLATELET
Basophils Absolute: 0 10*3/uL (ref 0.0–0.1)
Eosinophils Relative: 0.8 % (ref 0.0–5.0)
HCT: 39.2 % (ref 36.0–46.0)
Hemoglobin: 12.9 g/dL (ref 12.0–15.0)
Lymphs Abs: 2.2 10*3/uL (ref 0.7–4.0)
MCV: 87.8 fl (ref 78.0–100.0)
Monocytes Absolute: 0.5 10*3/uL (ref 0.1–1.0)
Monocytes Relative: 6.5 % (ref 3.0–12.0)
Neutro Abs: 5.5 10*3/uL (ref 1.4–7.7)
Platelets: 266 10*3/uL (ref 150.0–400.0)
RDW: 14.6 % (ref 11.5–14.6)

## 2011-09-11 LAB — T4, FREE: Free T4: 0.86 ng/dL (ref 0.60–1.60)

## 2011-09-11 NOTE — Progress Notes (Deleted)
  Subjective:    Patient ID: [name], female    DOB: [birth year], 76 y.o.   MRN: [id number]  HPI     Review of Systems     Objective:   Physical Exam        Assessment & Plan:

## 2011-09-11 NOTE — Telephone Encounter (Signed)
Caller: [name]/Patient; PCP: Roxy Manns A.; CB#: [phone]; ; ; Call regarding High Pulse Rate; 109 After Shower; 90 At Rest; onset 09/06/11.  States there is some pressure and occasionally a "thump."  She thought it was a result of the new medication/Elavil so she stopped taking any.  Taking "a great deal of water because my mouth feels so dry."  BS 229.  States not able to tolerate her usual activity.  Per protocol, emergent symptoms denied; advised appt within 24 hours.  Appt sched 09/11/11 1145 with Dr. Dayton Martes.

## 2011-09-11 NOTE — Patient Instructions (Signed)
Nice to meet you. Please stop by to see Shirlee Limerick on your way out to set up your cardiology referral. We will call you with your lab results.

## 2011-09-11 NOTE — Progress Notes (Signed)
76 yo pt of Dr. Milinda Antis, new to me, here for racing heart.  Past few days, feels her heart racing out of her chest.  Has home BP cuff and highest reading is 109.  Thought it was from her elavil so she stopped taking it, symptoms have persisted.  No CP.  No change in SOB. No fevers/chills, nausea/vomiting, diarrhea, abd pain, not exertional.   Pt doesn't smoke but she is exposed to passive smoke.   Positive family h/o CAD- Father with 1st heart attack late 70s. mother with 1st heart attack at 78yo.  Does have DM- takes Metformin and Glucotrol. HTN- takes Accupril 40 mg daily.  Lab Results  Component Value Date   CHOL 167 07/12/2011   HDL 46.50 07/12/2011   LDLCALC 103* 07/12/2011   TRIG 86.0 07/12/2011   CHOLHDL 4 07/12/2011    Has been staying away from pain meds per Dr Vallarie Mare.   h/o scoliosis in spine, chronic back pain and neck pain from this.  Told has bad discs in neck. ortho is at [facility] Value Date   TSH 0.77 07/12/2011    Patient Active Problem List  Diagnoses  . HERPES SIMPLEX INFECTION  . LYME DISEASE  . DIABETES MELLITUS, TYPE II  . HYPERLIPIDEMIA  . DEPRESSION  . HYPERTENSION  . ALLERGIC RHINITIS  . ASTHMA  . OVERACTIVE BLADDER  . OSTEOARTHRITIS  . DEGENERATIVE DISC DISEASE  . FIBROSITIS  . ARM PAIN, RIGHT  . SCOLIOSIS  . LIVER FUNCTION TESTS, ABNORMAL  . ADVEF, DRUG/MEDICINAL/BIOLOGICAL SUBST NOS  . Scoliosis  . Tachycardia   Past Medical History  Diagnosis Date  . Asthma     childhood  . Depression   . Scoliosis     chronic back pain  . Diabetes mellitus     Type II  . HLD (hyperlipidemia)   . HTN (hypertension)   . Osteoarthritis     hands,elbow,knees  . Rhinitis, allergic   . Foot fracture, right 3/13   Past Surgical History  Procedure Date  . Tubal ligation     bilateral  . Bladder tack    History  Substance Use Topics  . Smoking status: Never Smoker   . Smokeless tobacco: Not on file  . Alcohol Use: No    Family History  Problem Relation Age of Onset  . Coronary artery disease Mother   . Cancer Mother     thyroid  . Coronary artery disease Father   . Multiple sclerosis Sister    Allergies  Allergen Reactions  . Acetaminophen     REACTION: nausea  . Codeine     REACTION: itching  . Fluticasone Propionate     REACTION: palpitations  . Naproxen     REACTION: GI  . Nsaids     REACTION: elevated LFT's  . Penicillins     REACTION: swelling at inj site   Current Outpatient Prescriptions on File Prior to Visit  Medication Sig Dispense Refill  . amitriptyline (ELAVIL) 25 MG tablet TAKE  ONE TABLET BY MOUTH NIGHTLY AT BEDTIME  30 tablet  11  . amLODipine (NORVASC) 10 MG tablet TAKE ONE TABLET BY MOUTH EVERY DAY  30 tablet  10  . aspirin 81 MG tablet Take 81 mg by mouth daily.        . Calcium Carbonate-Vitamin D (CALCIUM 600+D) 600-400 MG-UNIT per tablet Take 2 tablets by mouth daily.        . fish oil-omega-3 fatty  acids 1000 MG capsule Take 1 capsule by mouth daily.        Marland Kitchen FREESTYLE TEST STRIPS test strip USE AS DIRECTED TO TEST BLOOD SUGAR ONCE DAILY  50 each  11  . glipiZIDE (GLUCOTROL XL) 5 MG 24 hr tablet Take 2 tablets (10 mg total) by mouth daily.  90 tablet  3  . Ibuprofen-Diphenhydramine Cit (ADVIL PM PO) Take by mouth as directed.        . Lancets 28G MISC by Does not apply route. Check sugar once daily and as needed for DM 250.0       . metFORMIN (GLUCOPHAGE) 500 MG tablet 2 every morning, 1 at lunch and 1 at supper  360 tablet  1  . Multiple Vitamin (MULTIVITAMIN) tablet Take 1 tablet by mouth daily.        Marland Kitchen nystatin (MYCOSTATIN) 100000 UNIT/ML suspension Take 500,000 Units by mouth 3 (three) times daily as needed.        . quinapril (ACCUPRIL) 40 MG tablet Take 1 tablet (40 mg total) by mouth at bedtime.  90 tablet  3   The PMH, PSH, Social History, Family History, Medications, and allergies have been reviewed in [facility], and have been updated if relevant.   Review of  Systems  per HPI   Physical Exam  BP 140/70  Pulse 106  Temp 98.4 F (36.9 C)  Wt 153 lb (69.4 kg)  LMP 04/22/1985  General: overweight but generally well appearing,  Head: normocephalic, atraumatic, and no abnormalities observed.  Mouth: pharynx pink and moist.  Neck: supple with full rom and no masses or thyromegally, no JVD or carotid bruit  Lungs: Normal respiratory effort, chest expands symmetrically. Lungs are clear to auscultation, no crackles or wheezes.  Heart: Normal rate and regular rhythm. S1 and S2 normal without gallop, murmur, click, rub or other extra sounds.  Msk: no midline c-spine tenderness, negative spurling bilaterally.  Pulses: 2+ rad pulses  Extremities: no edema  Neurologic: sensation intact to light touch, and DTRs symmetrical and normal. grip strength bilaterally equal (4+/5, slightly diminished from chronic arthritis)  Skin: Intact without suspicious lesions or rashes  Psych: talkative, circumferential and anxious  Assessment and Plan: 1. Tachycardia  EKG 12-Lead, Ambulatory referral to Cardiology, TSH, T4, Free, CBC with Differential   Notes reviewed- has had elevated HR on and off for years. Last EKG in 03/2010- HR was 100, otherwise normal. Last two office visits- HR 96. This may be due to anxiety and pain. EKG today - HR 106, SR with short PR syndrome (PR 106 today was 120 in 03/2010). Given that this has been a long standing issue but she is now symptomatic, along with her family history of CAD along with her h/o HTN, DM, will refer to cardiology for further work up. Discussed starting a beta blocker today but since this is long standing, I will await cardiology recs as I do not want to interfere with their studies (i.e stress testing). Will also check CBC, thyroid function today. Red flags given requiring immediate follow up. The patient indicates understanding of these issues and agrees with the plan.

## 2011-09-17 ENCOUNTER — Encounter: Payer: Self-pay | Admitting: Cardiovascular Disease

## 2011-09-17 ENCOUNTER — Ambulatory Visit (INDEPENDENT_AMBULATORY_CARE_PROVIDER_SITE_OTHER): Payer: Medicare Other | Admitting: Cardiovascular Disease

## 2011-09-17 VITALS — BP 142/72 | HR 100 | Ht 61.0 in | Wt 153.0 lb

## 2011-09-17 DIAGNOSIS — R000 Tachycardia, unspecified: Secondary | ICD-10-CM | POA: Diagnosis not present

## 2011-09-17 MED ORDER — METOPROLOL TARTRATE 25 MG PO TABS: 25.0000 mg | ORAL_TABLET | Freq: Two times a day (BID) | ORAL | Status: DC

## 2011-09-17 NOTE — Patient Instructions (Signed)
Your physician wants you to follow-up in: 1 month with Dr. Elease Hashimoto. You will receive a reminder letter in the mail two months in advance. If you don't receive a letter, please call our office to schedule the follow-up appointment.   Your physician has recommended you make the following change in your medication: start metoprolol 25 mg twice daily.

## 2011-09-17 NOTE — Progress Notes (Signed)
[name] Date of Birth  [birth year]       [facility]    [place] [address]  [address] [place], [place]  [address]   [place], [place]  [address] [phone]     [phone]   Fax  [phone]    Fax [phone]  Problem List: 1. Tachycardia 2. Hypertension 3. Diabetes Mellitus  History of Present Illness:  [name] presents to day for further evaluation of palpitations.  These episodes have been constant.  Her HR ranges from 106 - 93.  She watches her diet.  She avoids salt.  She likes to walk and is active around her house but doesn't get any aeorobic exercise.  Her glucose levels have been not as well controlled as she would like.  She denies episodes of syncope or presyncope she just feels like her heart is racing all the time.   Current Outpatient Prescriptions on File Prior to Visit  Medication Sig Dispense Refill  . amitriptyline (ELAVIL) 25 MG tablet TAKE  ONE TABLET BY MOUTH NIGHTLY AT BEDTIME  30 tablet  11  . amLODipine (NORVASC) 10 MG tablet TAKE ONE TABLET BY MOUTH EVERY DAY  30 tablet  10  . aspirin 81 MG tablet Take 81 mg by mouth daily.        . Calcium Carbonate-Vitamin D (CALCIUM 600+D) 600-400 MG-UNIT per tablet Take 2 tablets by mouth daily.        . fish oil-omega-3 fatty acids 1000 MG capsule Take 1 capsule by mouth 2 (two) times daily.       Marland Kitchen FREESTYLE TEST STRIPS test strip USE AS DIRECTED TO TEST BLOOD SUGAR ONCE DAILY  50 each  11  . glipiZIDE (GLUCOTROL XL) 5 MG 24 hr tablet Take 2 tablets (10 mg total) by mouth daily.  90 tablet  3  . Ibuprofen-Diphenhydramine Cit (ADVIL PM PO) Take by mouth as needed.       . Lancets 28G MISC by Does not apply route. Check sugar once daily and as needed for DM 250.0       . metFORMIN (GLUCOPHAGE) 500 MG tablet 2 every morning, 1 at lunch and 1 at supper  360 tablet  1  . Multiple Vitamin (MULTIVITAMIN) tablet Take 1 tablet by mouth daily.        .  quinapril (ACCUPRIL) 40 MG tablet Take 1 tablet (40 mg total) by mouth at bedtime.  90 tablet  3    Allergies  Allergen Reactions  . Acetaminophen     REACTION: nausea  . Codeine     REACTION: itching  . Fluticasone Propionate     REACTION: palpitations  . Naproxen     REACTION: GI  . Nsaids     REACTION: elevated LFT's  . Penicillins     REACTION: swelling at inj site    Past Medical History  Diagnosis Date  . Asthma     childhood  . Depression   . Scoliosis     chronic back pain  . Diabetes mellitus     Type II  . HLD (hyperlipidemia)   . HTN (hypertension)   . Osteoarthritis     hands,elbow,knees  . Rhinitis, allergic   . Foot fracture, right 3/13  . H/O scoliosis     Past Surgical History  Procedure Date  . Tubal ligation     bilateral  . Bladder tack     History  Smoking status  .  Never Smoker   Smokeless tobacco  . Not on file    History  Alcohol Use No    Family History  Problem Relation Age of Onset  . Coronary artery disease Mother   . Cancer Mother     thyroid  . Heart attack Mother   . Coronary artery disease Father   . Heart attack Father   . Multiple sclerosis Sister     Reviw of Systems:  Reviewed in the HPI.  All other systems are negative.  Physical Exam: Blood pressure 142/72, pulse 100, height 5\' 1"  (1.549 m), weight 153 lb (69.4 kg), last menstrual period 04/22/1985. General: Well developed, well nourished, in no acute distress.  Head: Normocephalic, atraumatic, sclera non-icteric, mucus membranes are moist,   Neck: Supple. Carotids are 2 + without bruits. No JVD  Lungs: Clear bilaterally to auscultation.  Heart: regular rate.  normal  S1 S2. No murmurs, gallops or rubs.  Abdomen: Soft, non-tender, non-distended with normal bowel sounds. No hepatomegaly. No rebound/guarding. No masses.  Msk:  Strength and tone are normal  Extremities: No clubbing or cyanosis. No edema.  Distal pedal pulses are 2+ and equal  bilaterally.  Neuro: Alert and oriented X 3. Moves all extremities spontaneously.  Psych:  Responds to questions appropriately with a normal affect.  ECG: Sep 11, 2011-normal sinus rhythm at 99 beats a minute. It is otherwise normal.  Assessment / Plan:

## 2011-09-17 NOTE — Assessment & Plan Note (Addendum)
Ardra presents with sinus tachycardia. I don't have a clear cut explanation for her symptoms. Her thyroid function tests are normal. Her glucose levels have been a little bit elevated but  I doubt the mild elevation is causing the sinus tachycardia. Her heart has been elevated for about a year.  We will start her on metoprolol 25 mg twice a day. If she becomes hypotensive then we will decrease her amlodipine to 5 mg a day. I'll see her back in the office in approximately one month for followup visit. I've asked to try to exercise on a regular basis.

## 2011-09-18 ENCOUNTER — Ambulatory Visit: Payer: Medicare Other | Admitting: Cardiovascular Disease

## 2011-10-28 ENCOUNTER — Other Ambulatory Visit: Payer: Self-pay

## 2011-10-28 MED ORDER — METFORMIN HCL 500 MG PO TABS: ORAL_TABLET | ORAL | Status: DC

## 2011-10-28 NOTE — Telephone Encounter (Signed)
Ok to refill? Last OV was 07/24/11

## 2011-10-28 NOTE — Telephone Encounter (Signed)
Will refill electronically  

## 2011-11-13 ENCOUNTER — Ambulatory Visit (INDEPENDENT_AMBULATORY_CARE_PROVIDER_SITE_OTHER): Payer: Medicare Other | Admitting: Cardiovascular Disease

## 2011-11-13 ENCOUNTER — Encounter: Payer: Self-pay | Admitting: Cardiovascular Disease

## 2011-11-13 VITALS — BP 142/64 | HR 78 | Ht 62.0 in | Wt 151.0 lb

## 2011-11-13 DIAGNOSIS — I10 Essential (primary) hypertension: Secondary | ICD-10-CM | POA: Diagnosis not present

## 2011-11-13 DIAGNOSIS — R0789 Other chest pain: Secondary | ICD-10-CM

## 2011-11-13 DIAGNOSIS — R0602 Shortness of breath: Secondary | ICD-10-CM | POA: Diagnosis not present

## 2011-11-13 DIAGNOSIS — R000 Tachycardia, unspecified: Secondary | ICD-10-CM | POA: Diagnosis not present

## 2011-11-13 NOTE — Assessment & Plan Note (Signed)
[name] is doing well on the Metoprolol.  Her HR is much better.  Will continue current meds. I'll see her again in 1 year.

## 2011-11-13 NOTE — Patient Instructions (Addendum)
Your physician wants you to follow-up in: 1 year with Dr. Nahser.   You will receive a reminder letter in the mail two months in advance. If you don't receive a letter, please call our office to schedule the follow-up appointment.  

## 2011-11-13 NOTE — Assessment & Plan Note (Addendum)
Continue current meds.  Her BP remains borderline elevated.  We have discussed salt restriction.  I will see her again in 1 year.  She has no complaints of CP or dyspnea.

## 2011-11-13 NOTE — Progress Notes (Signed)
[name] Date of Birth  Dec 23, 1935       [facility]    [place] [address]  [address] [place], [place]  [address]   [place], [place]  [address] [phone]     [phone]   Fax  [phone]    Fax [phone]  Problem List: 1. Tachycardia 2. Hypertension 3. Diabetes Mellitus  History of Present Illness:  [name] was seen several months ago for palpitations.  She was started on Metoprolol and her HR is better.  She does not smoke.  She does not get any regular exercise.  She has had a fracture of her right foot and still is not able to walk much.  Current Outpatient Prescriptions on File Prior to Visit  Medication Sig Dispense Refill  . amitriptyline (ELAVIL) 25 MG tablet TAKE  ONE TABLET BY MOUTH NIGHTLY AT BEDTIME  30 tablet  11  . amLODipine (NORVASC) 10 MG tablet TAKE ONE TABLET BY MOUTH EVERY DAY  30 tablet  10  . aspirin 81 MG tablet Take 81 mg by mouth daily.        . Calcium Carbonate-Vitamin D (CALCIUM 600+D) 600-400 MG-UNIT per tablet Take 2 tablets by mouth daily.        . fish oil-omega-3 fatty acids 1000 MG capsule Take 1 capsule by mouth 2 (two) times daily.       Marland Kitchen FREESTYLE TEST STRIPS test strip USE AS DIRECTED TO TEST BLOOD SUGAR ONCE DAILY  50 each  11  . glipiZIDE (GLUCOTROL XL) 5 MG 24 hr tablet Take 2 tablets (10 mg total) by mouth daily.  90 tablet  3  . Ibuprofen-Diphenhydramine Cit (ADVIL PM PO) Take by mouth as needed.       . Lancets 28G MISC by Does not apply route. Check sugar once daily and as needed for DM 250.0       . metFORMIN (GLUCOPHAGE) 500 MG tablet 2 every morning, 1 at lunch and 1 at supper  360 tablet  1  . metoprolol tartrate (LOPRESSOR) 25 MG tablet Take 1 tablet (25 mg total) by mouth 2 (two) times daily.  30 tablet  3  . Multiple Vitamin (MULTIVITAMIN) tablet Take 1 tablet by mouth daily.        Marland Kitchen omeprazole (PRILOSEC) 20 MG capsule Take 20 mg by mouth daily.      .  quinapril (ACCUPRIL) 40 MG tablet Take 1 tablet (40 mg total) by mouth at bedtime.  90 tablet  3    Allergies  Allergen Reactions  . Acetaminophen     REACTION: nausea  . Codeine     REACTION: itching  . Fluticasone Propionate     REACTION: palpitations  . Naproxen     REACTION: GI  . Nsaids     REACTION: elevated LFT's  . Penicillins     REACTION: swelling at inj site    Past Medical History  Diagnosis Date  . Asthma     childhood  . Depression   . Scoliosis     chronic back pain  . Diabetes mellitus     Type II  . HLD (hyperlipidemia)   . HTN (hypertension)   . Osteoarthritis     hands,elbow,knees  . Rhinitis, allergic   . Foot fracture, right 3/13  . H/O scoliosis     Past Surgical History  Procedure Date  . Tubal ligation     bilateral  . Bladder  tack     History  Smoking status  . Never Smoker   Smokeless tobacco  . Not on file    History  Alcohol Use No    Family History  Problem Relation Age of Onset  . Coronary artery disease Mother   . Cancer Mother     thyroid  . Heart attack Mother   . Coronary artery disease Father   . Heart attack Father   . Multiple sclerosis Sister     Reviw of Systems:  Reviewed in the HPI.  All other systems are negative.  Physical Exam: Blood pressure 142/64, pulse 78, height 5\' 2"  (1.575 m), weight 151 lb (68.493 kg), last menstrual period 04/22/1985. General: Well developed, well nourished, in no acute distress.  Head: Normocephalic, atraumatic, sclera non-icteric, mucus membranes are moist,   Neck: Supple. Carotids are 2 + without bruits. No JVD  Lungs: Clear bilaterally to auscultation.  Heart: regular rate.  normal  S1 S2. No murmurs, gallops or rubs.  Abdomen: Soft, non-tender, non-distended with normal bowel sounds. No hepatomegaly. No rebound/guarding. No masses.  Msk:  Strength and tone are normal  Extremities: No clubbing or cyanosis. No edema.  Distal pedal pulses are 2+ and equal  bilaterally.  Neuro: Alert and oriented X 3. Moves all extremities spontaneously.  Psych:  Responds to questions appropriately with a normal affect.  ECG: November 13, 2011- NSR at 43.  No ST or T wave changes  Assessment / Plan:

## 2011-11-18 ENCOUNTER — Other Ambulatory Visit: Payer: Self-pay | Admitting: Cardiovascular Disease

## 2011-11-18 MED ORDER — METOPROLOL TARTRATE 25 MG PO TABS: 25.0000 mg | ORAL_TABLET | Freq: Two times a day (BID) | ORAL | Status: DC

## 2011-11-18 NOTE — Telephone Encounter (Signed)
Refilled Metoprolol

## 2011-12-20 ENCOUNTER — Other Ambulatory Visit: Payer: Self-pay | Admitting: Family Medicine

## 2011-12-20 NOTE — Telephone Encounter (Signed)
Sent in Amlodipine 10 mg with 11 R. Patient has upcoming appt for CPE for 01/24/12.

## 2012-01-19 ENCOUNTER — Telehealth: Payer: Self-pay | Admitting: Family Medicine

## 2012-01-19 DIAGNOSIS — I10 Essential (primary) hypertension: Secondary | ICD-10-CM

## 2012-01-19 DIAGNOSIS — E785 Hyperlipidemia, unspecified: Secondary | ICD-10-CM

## 2012-01-19 DIAGNOSIS — E119 Type 2 diabetes mellitus without complications: Secondary | ICD-10-CM

## 2012-01-19 NOTE — Telephone Encounter (Signed)
Message copied by Judy Pimple on Sun Jan 19, 2012  9:15 PM ------      Message from: Alvina Chou      Created: Wed Jan 15, 2012 12:22 PM      Regarding: Lab orders for 01-20-12       Patient is scheduled for CPX labs, please order future labs, Thanks , Camelia Eng

## 2012-01-20 ENCOUNTER — Other Ambulatory Visit (INDEPENDENT_AMBULATORY_CARE_PROVIDER_SITE_OTHER): Payer: Medicare Other

## 2012-01-20 DIAGNOSIS — E119 Type 2 diabetes mellitus without complications: Secondary | ICD-10-CM | POA: Diagnosis not present

## 2012-01-20 DIAGNOSIS — E785 Hyperlipidemia, unspecified: Secondary | ICD-10-CM | POA: Diagnosis not present

## 2012-01-20 DIAGNOSIS — I10 Essential (primary) hypertension: Secondary | ICD-10-CM

## 2012-01-22 LAB — CBC WITH DIFFERENTIAL/PLATELET
Basophils Absolute: 0 10*3/uL (ref 0.0–0.1)
HCT: 38.7 % (ref 36.0–46.0)
Lymphs Abs: 2.2 10*3/uL (ref 0.7–4.0)
Monocytes Absolute: 0.4 10*3/uL (ref 0.1–1.0)
Monocytes Relative: 5.8 % (ref 3.0–12.0)
Neutrophils Relative %: 62.2 % (ref 43.0–77.0)
Platelets: 227 10*3/uL (ref 150.0–400.0)
RDW: 14.5 % (ref 11.5–14.6)

## 2012-01-22 LAB — LIPID PANEL
HDL: 36.5 mg/dL — ABNORMAL LOW (ref >39.00)
LDL Cholesterol: 109 mg/dL — ABNORMAL HIGH (ref 0–99)
Total CHOL/HDL Ratio: 5
VLDL: 29 mg/dL (ref 0.0–40.0)

## 2012-01-22 LAB — COMPREHENSIVE METABOLIC PANEL
ALT: 58 U/L — ABNORMAL HIGH (ref 0–35)
Alkaline Phosphatase: 51 U/L (ref 39–117)
Sodium: 139 mEq/L (ref 135–145)
Total Bilirubin: 0.9 mg/dL (ref 0.3–1.2)
Total Protein: 7.1 g/dL (ref 6.0–8.3)

## 2012-01-22 LAB — TSH: TSH: 0.84 u[IU]/mL (ref 0.35–5.50)

## 2012-01-24 ENCOUNTER — Encounter: Payer: Medicare Other | Admitting: Family Medicine

## 2012-01-26 ENCOUNTER — Other Ambulatory Visit: Payer: Self-pay | Admitting: Family Medicine

## 2012-01-31 ENCOUNTER — Ambulatory Visit (INDEPENDENT_AMBULATORY_CARE_PROVIDER_SITE_OTHER): Payer: Medicare Other | Admitting: Family Medicine

## 2012-01-31 ENCOUNTER — Encounter: Payer: Self-pay | Admitting: Family Medicine

## 2012-01-31 VITALS — BP 130/62 | HR 84 | Temp 98.4°F | Ht 59.75 in | Wt 153.0 lb

## 2012-01-31 DIAGNOSIS — Z1231 Encounter for screening mammogram for malignant neoplasm of breast: Secondary | ICD-10-CM

## 2012-01-31 DIAGNOSIS — I10 Essential (primary) hypertension: Secondary | ICD-10-CM

## 2012-01-31 DIAGNOSIS — E119 Type 2 diabetes mellitus without complications: Secondary | ICD-10-CM

## 2012-01-31 DIAGNOSIS — E785 Hyperlipidemia, unspecified: Secondary | ICD-10-CM | POA: Diagnosis not present

## 2012-01-31 DIAGNOSIS — Z780 Asymptomatic menopausal state: Secondary | ICD-10-CM

## 2012-01-31 DIAGNOSIS — R945 Abnormal results of liver function studies: Secondary | ICD-10-CM

## 2012-01-31 DIAGNOSIS — R42 Dizziness and giddiness: Secondary | ICD-10-CM | POA: Insufficient documentation

## 2012-01-31 DIAGNOSIS — Z23 Encounter for immunization: Secondary | ICD-10-CM | POA: Diagnosis not present

## 2012-01-31 NOTE — Assessment & Plan Note (Signed)
bp in fair control at this time  No changes needed  Disc lifstyle change with low sodium diet and exercise   Rev labs today 

## 2012-01-31 NOTE — Assessment & Plan Note (Signed)
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

## 2012-01-31 NOTE — Patient Instructions (Addendum)
Flu shot and tetanus shot today  We will refer you to ENT for vertigo  We will refer you for mammogram and bone density test at check out  Take care of yourself  Get more active as your foot improves Avoid red meat/ fried foods/ egg yolks/ fatty breakfast meats/ butter, cheese and high fat dairy/ and shellfish

## 2012-01-31 NOTE — Assessment & Plan Note (Signed)
Needs dexa in light of foot fx  On ca and D

## 2012-01-31 NOTE — Progress Notes (Signed)
Subjective:    Patient ID: [name], female    DOB: August 06, 1935, 76 y.o.   MRN: [id number]  HPI Here for check up of chronic medical conditions and to review health mt list   Has battled some vertigo lately  Worse with head pos change - and room spins  Esp rolling over in bed  ENT - interested in a ref   Wt is up 2 lb with bmi of 30  ast /alt are up  Dm- a1c is 6.9 Glipizide and metformin Sugar is running about the diet  Not as much exercise - was in a boot for 12 weeks after breaking a foot  Just started wearing her normal shoes    bp is stable today  No cp or palpitations or headaches or edema  No side effects to medicines  BP Readings from Last 3 Encounters:  01/31/12 130/62  11/13/11 142/64  09/17/11 142/72      Lipid Lab Results  Component Value Date   CHOL 174 01/20/2012   CHOL 167 07/12/2011   CHOL 185 07/03/2010   Lab Results  Component Value Date   HDL 36.50* 01/20/2012   HDL 46.50 07/12/2011   HDL 40.98 07/03/2010   Lab Results  Component Value Date   LDLCALC 109* 01/20/2012   LDLCALC 103* 07/12/2011   LDLCALC 121* 07/03/2010   Lab Results  Component Value Date   TRIG 145.0 01/20/2012   TRIG 86.0 07/12/2011   TRIG 101.0 07/03/2010   Lab Results  Component Value Date   CHOLHDL 5 01/20/2012   CHOLHDL 4 07/12/2011   CHOLHDL 4 07/03/2010   No results found for this basename: LDLDIRECT   HDL came down due to less exercise lately   Needs dexa Takes ca and D and mvi Colon cancer screen  Flu shot today- got that already  Td was 04- will get that today  Not around babies  mammo- 07 needs one  [place] also  Self exam - tries to keep up with   No gyn problems   Patient Active Problem List  Diagnosis  . HERPES SIMPLEX INFECTION  . LYME DISEASE  . DIABETES MELLITUS, TYPE II  . HYPERLIPIDEMIA  . DEPRESSION  . HYPERTENSION  . ALLERGIC RHINITIS  . ASTHMA  . OVERACTIVE BLADDER  . OSTEOARTHRITIS  . DEGENERATIVE DISC DISEASE  .  FIBROSITIS  . ARM PAIN, RIGHT  . SCOLIOSIS  . LIVER FUNCTION TESTS, ABNORMAL  . ADVEF, DRUG/MEDICINAL/BIOLOGICAL SUBST NOS  . Scoliosis  . Tachycardia  . Other screening mammogram  . Post-menopausal  . Vertigo   Past Medical History  Diagnosis Date  . Asthma     childhood  . Depression   . Scoliosis     chronic back pain  . Diabetes mellitus     Type II  . HLD (hyperlipidemia)   . HTN (hypertension)   . Osteoarthritis     hands,elbow,knees  . Rhinitis, allergic   . Foot fracture, right 3/13  . H/O scoliosis    Past Surgical History  Procedure Date  . Tubal ligation     bilateral  . Bladder tack    History  Substance Use Topics  . Smoking status: Never Smoker   . Smokeless tobacco: Not on file  . Alcohol Use: No   Family History  Problem Relation Age of Onset  . Coronary artery disease Mother   . Cancer Mother     thyroid  . Heart attack Mother   . Coronary  artery disease Father   . Heart attack Father   . Multiple sclerosis Sister    Allergies  Allergen Reactions  . Acetaminophen     REACTION: nausea  . Codeine     REACTION: itching  . Fluticasone Propionate     REACTION: palpitations  . Naproxen     REACTION: GI  . Nsaids     REACTION: elevated LFT's  . Penicillins     REACTION: swelling at inj site   Current Outpatient Prescriptions on File Prior to Visit  Medication Sig Dispense Refill  . amitriptyline (ELAVIL) 25 MG tablet TAKE  ONE TABLET BY MOUTH NIGHTLY AT BEDTIME  30 tablet  11  . amLODipine (NORVASC) 10 MG tablet TAKE ONE TABLET BY MOUTH EVERY DAY  30 tablet  9  . aspirin 81 MG tablet Take 81 mg by mouth daily.        . Calcium Carbonate-Vitamin D (CALCIUM 600+D) 600-400 MG-UNIT per tablet Take 2 tablets by mouth daily.        . fish oil-omega-3 fatty acids 1000 MG capsule Take 1 capsule by mouth 2 (two) times daily.       Marland Kitchen FREESTYLE TEST STRIPS test strip USE AS DIRECTED TO TEST BLOOD SUGAR ONCE DAILY  50 each  11  . glipiZIDE  (GLUCOTROL XL) 5 MG 24 hr tablet TAKE 2 TABLETS (10 MG TOTAL) BY MOUTH DAILY.  180 tablet  3  . Ibuprofen-Diphenhydramine Cit (ADVIL PM PO) Take by mouth as needed.       . Lancets 28G MISC by Does not apply route. Check sugar once daily and as needed for DM 250.0       . metFORMIN (GLUCOPHAGE) 500 MG tablet 2 every morning, 1 at lunch and 1 at supper  360 tablet  1  . metoprolol tartrate (LOPRESSOR) 25 MG tablet Take 1 tablet (25 mg total) by mouth 2 (two) times daily.  30 tablet  6  . Multiple Vitamin (MULTIVITAMIN) tablet Take 1 tablet by mouth daily.        Marland Kitchen omeprazole (PRILOSEC) 20 MG capsule Take 20 mg by mouth daily.      . quinapril (ACCUPRIL) 40 MG tablet Take 1 tablet (40 mg total) by mouth at bedtime.  90 tablet  3         Review of Systems Review of Systems  Constitutional: Negative for fever, appetite change, fatigue and unexpected weight change.  Eyes: Negative for pain and visual disturbance.  Respiratory: Negative for cough and shortness of breath.   Cardiovascular: Negative for cp or palpitations    Gastrointestinal: Negative for nausea, diarrhea and constipation.  Genitourinary: Negative for urgency and frequency.  Skin: Negative for pallor or rash   MSK pos for aches and pains from arthritis  Neurological: Negative for weakness, light-headedness, numbness and headaches. pos for vertigo type dizziness  Hematological: Negative for adenopathy. Does not bruise/bleed easily.  Psychiatric/Behavioral: Negative for dysphoric mood. The patient is not nervous/anxious.         Objective:   Physical Exam  Constitutional: She appears well-developed and well-nourished. No distress.  HENT:  Head: Normocephalic and atraumatic.  Mouth/Throat: Oropharynx is clear and moist.  Eyes: Conjunctivae normal and EOM are normal. Pupils are equal, round, and reactive to light. Right eye exhibits no discharge. Left eye exhibits no discharge. No scleral icterus.  Neck: Normal range of  motion. Neck supple. No JVD present. No thyromegaly present.  Cardiovascular: Normal rate, regular rhythm, normal heart sounds  and intact distal pulses.  Exam reveals no gallop.   Pulmonary/Chest: Effort normal and breath sounds normal. No respiratory distress. She has no wheezes.  Abdominal: Soft. Bowel sounds are normal. She exhibits no distension and no mass. There is no tenderness.  Genitourinary: Uterus normal. No breast swelling, tenderness, discharge or bleeding. Right adnexum displays no fullness. Left adnexum displays no fullness. No tenderness or bleeding around the vagina.       Mild cystocele noted No M on bimanual exam  Breast exam: No mass, nodules, thickening, tenderness, bulging, retraction, inflamation, nipple discharge or skin changes noted.  No axillary or clavicular LA.  Chaperoned exam.    Musculoskeletal: She exhibits no edema.  Lymphadenopathy:    She has no cervical adenopathy.  Neurological: She is alert. She has normal reflexes. No cranial nerve deficit. She exhibits normal muscle tone. Coordination normal.  Skin: Skin is warm and dry. No rash noted. No erythema. No pallor.  Psychiatric: She has a normal mood and affect.          Assessment & Plan:

## 2012-01-31 NOTE — Assessment & Plan Note (Signed)
Scheduled annual screening mammogram Nl breast exam today  Encouraged monthly self exams   

## 2012-02-02 NOTE — Assessment & Plan Note (Signed)
For past week- positional and intermittent No ear symptoms  Nl neuro exam Ref to ENT for further eval

## 2012-02-05 DIAGNOSIS — H8120 Vestibular neuronitis, unspecified ear: Secondary | ICD-10-CM | POA: Diagnosis not present

## 2012-02-05 DIAGNOSIS — R42 Dizziness and giddiness: Secondary | ICD-10-CM | POA: Diagnosis not present

## 2012-02-13 ENCOUNTER — Ambulatory Visit: Payer: Self-pay | Admitting: Family Medicine

## 2012-02-13 DIAGNOSIS — Z780 Asymptomatic menopausal state: Secondary | ICD-10-CM | POA: Diagnosis not present

## 2012-02-13 DIAGNOSIS — M810 Age-related osteoporosis without current pathological fracture: Secondary | ICD-10-CM | POA: Diagnosis not present

## 2012-02-13 DIAGNOSIS — N959 Unspecified menopausal and perimenopausal disorder: Secondary | ICD-10-CM | POA: Diagnosis not present

## 2012-02-26 ENCOUNTER — Telehealth: Payer: Self-pay

## 2012-02-26 ENCOUNTER — Encounter: Payer: Self-pay | Admitting: Family Medicine

## 2012-02-26 NOTE — Telephone Encounter (Signed)
Pt notified, appt scheduled for 03/02/12 to discuss results

## 2012-02-26 NOTE — Telephone Encounter (Signed)
Pt request bone density results done on 02/13/12 and wants to know when should schedule f/u appt with Dr Milinda Antis. We have not received bone density report; contacted [facility]; report faxed and placed Dr Royden Purl shelf in the in box.Please advise.

## 2012-02-26 NOTE — Telephone Encounter (Signed)
I put it with comments in IN box Please enter into health mt She has mild bone loss/osteopenia at hips More bone loss at forearm - osteoporosis  Please f/u to disc tx options when able, thanks

## 2012-03-02 ENCOUNTER — Ambulatory Visit (INDEPENDENT_AMBULATORY_CARE_PROVIDER_SITE_OTHER): Payer: Medicare Other | Admitting: Family Medicine

## 2012-03-02 ENCOUNTER — Encounter: Payer: Self-pay | Admitting: Family Medicine

## 2012-03-02 VITALS — BP 118/58 | HR 80 | Temp 98.7°F | Ht 59.75 in | Wt 150.8 lb

## 2012-03-02 DIAGNOSIS — M810 Age-related osteoporosis without current pathological fracture: Secondary | ICD-10-CM | POA: Diagnosis not present

## 2012-03-02 MED ORDER — ALENDRONATE SODIUM 70 MG PO TABS: 70.0000 mg | ORAL_TABLET | ORAL | Status: DC

## 2012-03-02 NOTE — Patient Instructions (Addendum)
Start fosamax as directed for osteoporosis - to prevent fractures  Be careful - to avoid fractures Any kind of weight bearing exercise helps bone health Take your calcium and vitamin D We will re check bone density test in 2 years If you have any side effects at all- like worse acid reflux or trouble swallowing

## 2012-03-02 NOTE — Assessment & Plan Note (Signed)
New dx today Counseled on it /rev dexa Has had a fx with evidence of OP before [place] and post menopausal Is on ca and D Will add fosamax-disc pros/cons/ poss side eff-will update- sent to pharmacy Consider evista if this does not work out  Environmental education officer in detail  Re check dexa 2 y

## 2012-03-02 NOTE — Progress Notes (Signed)
Subjective:    Patient ID: [name], female    DOB: July 11, 1935, 76 y.o.   MRN: [id number]  HPI Here for f/u of OP  Had dexa  T score LS nl at 0.2- has deg changes of spine FN bilat-1.2 Forearm much lower at T score of -2.5 Ca and D- is good about that  Hx of fx- already broke her foot  - and not enough bone present to put in a screw- so knew she had bad bone  fam hx - thinks her aunt had it - hip fracture   Pt is very active   She is interested in medication   Patient Active Problem List  Diagnosis  . HERPES SIMPLEX INFECTION  . LYME DISEASE  . DIABETES MELLITUS, TYPE II  . HYPERLIPIDEMIA  . DEPRESSION  . HYPERTENSION  . ALLERGIC RHINITIS  . ASTHMA  . OVERACTIVE BLADDER  . OSTEOARTHRITIS  . DEGENERATIVE DISC DISEASE  . FIBROSITIS  . ARM PAIN, RIGHT  . SCOLIOSIS  . LIVER FUNCTION TESTS, ABNORMAL  . ADVEF, DRUG/MEDICINAL/BIOLOGICAL SUBST NOS  . Scoliosis  . Tachycardia  . Other screening mammogram  . Post-menopausal  . Vertigo  . Osteoporosis   Past Medical History  Diagnosis Date  . Asthma     childhood  . Depression   . Scoliosis     chronic back pain  . Diabetes mellitus     Type II  . HLD (hyperlipidemia)   . HTN (hypertension)   . Osteoarthritis     hands,elbow,knees  . Rhinitis, allergic   . Foot fracture, right 3/13  . H/O scoliosis   . Osteoporosis    Past Surgical History  Procedure Date  . Tubal ligation     bilateral  . Bladder tack    History  Substance Use Topics  . Smoking status: Never Smoker   . Smokeless tobacco: Not on file  . Alcohol Use: No   Family History  Problem Relation Age of Onset  . Coronary artery disease Mother   . Cancer Mother     thyroid  . Heart attack Mother   . Coronary artery disease Father   . Heart attack Father   . Multiple sclerosis Sister    Allergies  Allergen Reactions  . Acetaminophen     REACTION: nausea  . Codeine     REACTION: itching  . Fluticasone Propionate     REACTION:  palpitations  . Naproxen     REACTION: GI  . Nsaids     REACTION: elevated LFT's  . Penicillins     REACTION: swelling at inj site   Current Outpatient Prescriptions on File Prior to Visit  Medication Sig Dispense Refill  . amitriptyline (ELAVIL) 25 MG tablet TAKE  ONE TABLET BY MOUTH NIGHTLY AT BEDTIME  30 tablet  11  . amLODipine (NORVASC) 10 MG tablet TAKE ONE TABLET BY MOUTH EVERY DAY  30 tablet  9  . aspirin 81 MG tablet Take 81 mg by mouth daily.        . Calcium Carbonate-Vitamin D (CALCIUM 600+D) 600-400 MG-UNIT per tablet Take 2 tablets by mouth daily.        . fish oil-omega-3 fatty acids 1000 MG capsule Take 1 capsule by mouth 2 (two) times daily.       Marland Kitchen FREESTYLE TEST STRIPS test strip USE AS DIRECTED TO TEST BLOOD SUGAR ONCE DAILY  50 each  11  . glipiZIDE (GLUCOTROL XL) 5 MG 24 hr tablet TAKE 2  TABLETS (10 MG TOTAL) BY MOUTH DAILY.  180 tablet  3  . Ibuprofen-Diphenhydramine Cit (ADVIL PM PO) Take by mouth as needed.       . Lancets 28G MISC by Does not apply route. Check sugar once daily and as needed for DM 250.0       . metFORMIN (GLUCOPHAGE) 500 MG tablet 2 every morning, 1 at lunch and 1 at supper  360 tablet  1  . metoprolol tartrate (LOPRESSOR) 25 MG tablet Take 1 tablet (25 mg total) by mouth 2 (two) times daily.  30 tablet  6  . Multiple Vitamin (MULTIVITAMIN) tablet Take 1 tablet by mouth daily.        Marland Kitchen omeprazole (PRILOSEC) 20 MG capsule Take 20 mg by mouth daily.      . quinapril (ACCUPRIL) 40 MG tablet Take 1 tablet (40 mg total) by mouth at bedtime.  90 tablet  3         Review of Systems Review of Systems  Constitutional: Negative for fever, appetite change, fatigue and unexpected weight change.  Eyes: Negative for pain and visual disturbance.  Respiratory: Negative for cough and shortness of breath.   Cardiovascular: Negative for cp or palpitations    Gastrointestinal: Negative for nausea, diarrhea and constipation.  Genitourinary: Negative for  urgency and frequency.  Skin: Negative for pallor or rash   MSK neg for loss of ht or kyphosis Neurological: Negative for weakness, light-headedness, numbness and headaches.  Hematological: Negative for adenopathy. Does not bruise/bleed easily.  Psychiatric/Behavioral: Negative for dysphoric mood. The patient is not nervous/anxious.         Objective:   Physical Exam  Constitutional: She appears well-developed and well-nourished. No distress.  HENT:  Head: Normocephalic and atraumatic.  Mouth/Throat: Oropharynx is clear and moist.  Eyes: Conjunctivae normal and EOM are normal. Pupils are equal, round, and reactive to light.  Neck: Normal range of motion. Neck supple.  Cardiovascular: Normal rate and regular rhythm.   Musculoskeletal: She exhibits no edema and no tenderness.       No kyphosis  Very petite frame  Neurological: She is alert.  Skin: Skin is warm and dry. No rash noted. No pallor.  Psychiatric: She has a normal mood and affect.          Assessment & Plan:

## 2012-03-05 ENCOUNTER — Encounter: Payer: Self-pay | Admitting: Family Medicine

## 2012-03-05 ENCOUNTER — Ambulatory Visit: Payer: Self-pay | Admitting: Family Medicine

## 2012-03-05 DIAGNOSIS — Z1231 Encounter for screening mammogram for malignant neoplasm of breast: Secondary | ICD-10-CM | POA: Diagnosis not present

## 2012-03-05 DIAGNOSIS — R928 Other abnormal and inconclusive findings on diagnostic imaging of breast: Secondary | ICD-10-CM | POA: Diagnosis not present

## 2012-03-17 ENCOUNTER — Other Ambulatory Visit: Payer: Self-pay | Admitting: Family Medicine

## 2012-03-17 ENCOUNTER — Telehealth: Payer: Self-pay | Admitting: Family Medicine

## 2012-03-17 NOTE — Telephone Encounter (Signed)
Thanks for the update - definitely do not take more and I will put on her med intol list  Update me again in 2 weeks to make sure symptoms are totally gone before we decide to try anything else thanks

## 2012-03-17 NOTE — Telephone Encounter (Signed)
Pt notified and advise to stop med and updated [place] in 2 weeks to make sure she is sxs free before starting a new med

## 2012-03-17 NOTE — Telephone Encounter (Signed)
Please ask her to tell you what side effects she is having, thanks

## 2012-03-17 NOTE — Telephone Encounter (Signed)
[address] [place], [place] [address] p. [phone] f. [phone] To: [facility] (After Hours Triage) Fax: [phone] From: [facility] Date/ Time: 03/16/2012 6:03 PM Taken By: Jethro BolusElease Hashimoto Facility: home Patient: [name], [name] DOB: [birth year] Phone: [phone] Reason for Call: Caller feels she had a reaction to Fosamax and wants to know if you want to prescibe a different medicine. Regarding Appointment: Appt Date: Appt Time: Unknown Provider: Reason: Details: Outcome:

## 2012-03-17 NOTE — Telephone Encounter (Signed)
Pt said after taking med the next day she had problems swallowing she felt like her throat was swollen a little and it made it hard to swallow, also pt had bad joint pain especially in her hands an knees also she had a fever of 101 x 2 days after taking med

## 2012-04-09 DIAGNOSIS — H2510 Age-related nuclear cataract, unspecified eye: Secondary | ICD-10-CM | POA: Diagnosis not present

## 2012-04-17 ENCOUNTER — Telehealth: Payer: Self-pay | Admitting: Family Medicine

## 2012-04-17 NOTE — Telephone Encounter (Signed)
Caller: [name]/Patient; Phone: [phone] [place] was formerly on Fosamax and had an allergic reaction to it.  Was advised by MD to stop medication and call back in 2 weeks for another medication to be started.  Pt states it has now been 2 weeks and she is no longer having symptoms.  Requesting new medication to be called in to [facility].  Requests to be called by office once medication has been called in.  OFFICE, PLEASE F/U WITH PT.

## 2012-04-19 MED ORDER — RALOXIFENE HCL 60 MG PO TABS: 60.0000 mg | ORAL_TABLET | Freq: Every day | ORAL | Status: DC

## 2012-04-19 NOTE — Telephone Encounter (Signed)
Lets try evista- we discussed this as an option at her last visit  Px written for call in   Update if any problems or symptoms

## 2012-04-20 ENCOUNTER — Telehealth: Payer: Self-pay | Admitting: Family Medicine

## 2012-04-20 NOTE — Telephone Encounter (Signed)
Rx called in as prescribed Left voicemail requesting pt to call office

## 2012-04-20 NOTE — Telephone Encounter (Signed)
Left 2nd voicemail requesting pt to call office, will try to call back later

## 2012-04-20 NOTE — Telephone Encounter (Signed)
Caller: [place]/Patient; Phone: [phone]; Reason for Call: Patient returning call to office regarding change in prescription; states she cannot tolerate Fosamax.  Per Epic, advised that Dr.  Milinda Antis [facility]; to take 1 tablet daily.  Patient states she will pick up medication at drug store.  Krs/can

## 2012-04-23 NOTE — Telephone Encounter (Signed)
Called pt twice and both times phone was busy will try to call back

## 2012-04-24 NOTE — Telephone Encounter (Signed)
Pt notified Rx was sent to pharm and to updated [place] if she has any sxs or side effects with med

## 2012-05-01 ENCOUNTER — Other Ambulatory Visit: Payer: Self-pay

## 2012-05-01 MED ORDER — METFORMIN HCL 500 MG PO TABS: ORAL_TABLET | ORAL | Status: DC

## 2012-05-01 NOTE — Telephone Encounter (Signed)
[facility] faxed refill metformin 500 mg. # 360 x 1. Last filled 01/31/12.

## 2012-05-07 ENCOUNTER — Other Ambulatory Visit: Payer: Self-pay | Admitting: Family Medicine

## 2012-05-07 NOTE — Telephone Encounter (Signed)
Ok to refill 

## 2012-05-07 NOTE — Telephone Encounter (Signed)
Please refil for 12 months, thanks  

## 2012-06-09 LAB — HM DIABETES EYE EXAM: HM Diabetic Eye Exam: NORMAL

## 2012-06-24 ENCOUNTER — Other Ambulatory Visit: Payer: Self-pay

## 2012-06-24 MED ORDER — METOPROLOL TARTRATE 25 MG PO TABS: 25.0000 mg | ORAL_TABLET | Freq: Two times a day (BID) | ORAL | Status: DC

## 2012-06-24 NOTE — Telephone Encounter (Signed)
Refilled Metoprolol Tartrate [facility].

## 2012-07-16 ENCOUNTER — Other Ambulatory Visit: Payer: Self-pay | Admitting: Family Medicine

## 2012-07-17 ENCOUNTER — Other Ambulatory Visit: Payer: Self-pay | Admitting: Family Medicine

## 2012-08-31 ENCOUNTER — Other Ambulatory Visit: Payer: Medicare Other

## 2012-08-31 ENCOUNTER — Ambulatory Visit: Payer: Medicare Other | Admitting: Family Medicine

## 2012-08-31 ENCOUNTER — Telehealth: Payer: Self-pay | Admitting: Family Medicine

## 2012-08-31 DIAGNOSIS — E119 Type 2 diabetes mellitus without complications: Secondary | ICD-10-CM

## 2012-08-31 DIAGNOSIS — E785 Hyperlipidemia, unspecified: Secondary | ICD-10-CM

## 2012-08-31 DIAGNOSIS — I10 Essential (primary) hypertension: Secondary | ICD-10-CM

## 2012-08-31 NOTE — Telephone Encounter (Signed)
Message copied by Judy Pimple on Mon Aug 31, 2012  9:29 PM ------      Message from: Alvina Chou      Created: Mon Aug 31, 2012  1:58 PM      Regarding: Lab orders for Tuesday, 5.13.14       Labs for a 6 month f/u ------

## 2012-09-01 ENCOUNTER — Other Ambulatory Visit (INDEPENDENT_AMBULATORY_CARE_PROVIDER_SITE_OTHER): Payer: Medicare Other

## 2012-09-01 DIAGNOSIS — R945 Abnormal results of liver function studies: Secondary | ICD-10-CM

## 2012-09-01 DIAGNOSIS — I10 Essential (primary) hypertension: Secondary | ICD-10-CM | POA: Diagnosis not present

## 2012-09-01 DIAGNOSIS — E119 Type 2 diabetes mellitus without complications: Secondary | ICD-10-CM

## 2012-09-01 LAB — COMPREHENSIVE METABOLIC PANEL
Alkaline Phosphatase: 38 U/L — ABNORMAL LOW (ref 39–117)
BUN: 10 mg/dL (ref 6–23)
CO2: 27 mEq/L (ref 19–32)
Creatinine, Ser: 0.9 mg/dL (ref 0.4–1.2)
GFR: 62.5 mL/min (ref >60.00)
Glucose, Bld: 142 mg/dL — ABNORMAL HIGH (ref 70–99)
Total Bilirubin: 0.5 mg/dL (ref 0.3–1.2)
Total Protein: 6.9 g/dL (ref 6.0–8.3)

## 2012-09-01 LAB — HEMOGLOBIN A1C: Hgb A1c MFr Bld: 7.4 % — ABNORMAL HIGH (ref 4.6–6.5)

## 2012-09-04 ENCOUNTER — Encounter: Payer: Self-pay | Admitting: Family Medicine

## 2012-09-04 ENCOUNTER — Ambulatory Visit (INDEPENDENT_AMBULATORY_CARE_PROVIDER_SITE_OTHER): Payer: Medicare Other | Admitting: Family Medicine

## 2012-09-04 VITALS — BP 126/74 | HR 84 | Temp 98.1°F | Ht 59.75 in | Wt 146.0 lb

## 2012-09-04 DIAGNOSIS — I10 Essential (primary) hypertension: Secondary | ICD-10-CM | POA: Diagnosis not present

## 2012-09-04 DIAGNOSIS — E119 Type 2 diabetes mellitus without complications: Secondary | ICD-10-CM | POA: Diagnosis not present

## 2012-09-04 MED ORDER — ALBUTEROL SULFATE HFA 108 (90 BASE) MCG/ACT IN AERS: 2.0000 | INHALATION_SPRAY | RESPIRATORY_TRACT | Status: DC | PRN

## 2012-09-04 NOTE — Progress Notes (Signed)
Subjective:    Patient ID: [name], female    DOB: 01-May-1935, 77 y.o.   MRN: [id number]  HPI Here for f/u of chronic health problems  Vertigo is chronic - she has seen ENT - and when she gets dizzy  Has had some falls  Stays home and takes medicine when she is dizzy   Wt is down 4 lb with bmi of 28  bp is stable today  No cp or palpitations or headaches or edema  No side effects to medicines -on beta blocker bp and pulse are better  BP Readings from Last 3 Encounters:  09/04/12 126/74  03/02/12 118/58  01/31/12 130/62      Ast/alt came down a bit  Lab Results  Component Value Date   ALT 45* 09/01/2012   AST 52* 09/01/2012   ALKPHOS 38* 09/01/2012   BILITOT 0.5 09/01/2012     Diabetes Home sugar results - fasting 70-90 , an hour after a meal up to 120-140  Highest was 240 after a very big meal (she does not check it as much at night)  DM diet - avoids sugar , less appetitie (not eating as much) - she has been through DM ed and makes an effort  Exercise -not a lot/ not a lot of energy these days for multiple reasons -- would consider joining [facility]  Can walk a fair amount  Symptoms-none  A1C last  Lab Results  Component Value Date   HGBA1C 7.4* 09/01/2012  this is up from 6.9)  No problems with medications  Renal protection- on ace  Last eye exam - was 3 months ago - no DM disease (does have cataracts)   Allergy season much worse than usual   Patient Active Problem List   Diagnosis Date Noted  . Osteoporosis 03/02/2012  . Other screening mammogram 01/31/2012  . Post-menopausal 01/31/2012  . Vertigo 01/31/2012  . Tachycardia 09/11/2011  . Scoliosis   . ARM PAIN, RIGHT 03/29/2010  . ALLERGIC RHINITIS 08/12/2007  . ADVEF, DRUG/MEDICINAL/BIOLOGICAL SUBST NOS 08/20/2006  . HERPES SIMPLEX INFECTION 07/28/2006  . LYME DISEASE 07/28/2006  . DIABETES MELLITUS, TYPE II 07/28/2006  . HYPERLIPIDEMIA 07/28/2006  . DEPRESSION 07/28/2006  . HYPERTENSION 07/28/2006   . ASTHMA 07/28/2006  . OVERACTIVE BLADDER 07/28/2006  . OSTEOARTHRITIS 07/28/2006  . DEGENERATIVE DISC DISEASE 07/28/2006  . FIBROSITIS 07/28/2006  . SCOLIOSIS 07/28/2006  . LIVER FUNCTION TESTS, ABNORMAL 07/28/2006   Past Medical History  Diagnosis Date  . Asthma     childhood  . Depression   . Scoliosis     chronic back pain  . Diabetes mellitus     Type II  . HLD (hyperlipidemia)   . HTN (hypertension)   . Osteoarthritis     hands,elbow,knees  . Rhinitis, allergic   . Foot fracture, right 3/13  . H/O scoliosis   . Osteoporosis    Past Surgical History  Procedure Laterality Date  . Tubal ligation      bilateral  . Bladder tack     History  Substance Use Topics  . Smoking status: Never Smoker   . Smokeless tobacco: Not on file  . Alcohol Use: No   Family History  Problem Relation Age of Onset  . Coronary artery disease Mother   . Cancer Mother     thyroid  . Heart attack Mother   . Coronary artery disease Father   . Heart attack Father   . Multiple sclerosis Sister  Allergies  Allergen Reactions  . Acetaminophen     REACTION: nausea  . Codeine     REACTION: itching  . Fluticasone Propionate     REACTION: palpitations  . Fosamax (Alendronate Sodium)     Difficulty swallowing and fever/ joint pain   . Naproxen     REACTION: GI  . Nsaids     REACTION: elevated LFT's  . Penicillins     REACTION: swelling at inj site   Current Outpatient Prescriptions on File Prior to Visit  Medication Sig Dispense Refill  . amitriptyline (ELAVIL) 25 MG tablet TAKE  ONE TABLET BY MOUTH NIGHTLY AT BEDTIME  30 tablet  11  . amLODipine (NORVASC) 10 MG tablet TAKE ONE TABLET BY MOUTH EVERY DAY  30 tablet  9  . aspirin 81 MG tablet Take 81 mg by mouth daily.        . Calcium Carbonate-Vitamin D (CALCIUM 600+D) 600-400 MG-UNIT per tablet Take 2 tablets by mouth daily.        . fish oil-omega-3 fatty acids 1000 MG capsule Take 1 capsule by mouth 2 (two) times daily.        Marland Kitchen FREESTYLE TEST STRIPS test strip USE AS DIRECTED TO TEST BLOOD SUGAR ONCE DAILY  50 each  11  . glipiZIDE (GLUCOTROL XL) 5 MG 24 hr tablet TAKE 2 TABLETS (10 MG TOTAL) BY MOUTH DAILY.  180 tablet  3  . Ibuprofen-Diphenhydramine Cit (ADVIL PM PO) Take by mouth as needed.       . Lancets 28G MISC by Does not apply route. Check sugar once daily and as needed for DM 250.0       . meclizine (ANTIVERT) 25 MG tablet Take 25 mg by mouth as needed.      . metFORMIN (GLUCOPHAGE) 500 MG tablet 2 every morning, 1 at lunch and 1 at supper  360 tablet  1  . metoprolol tartrate (LOPRESSOR) 25 MG tablet Take 1 tablet (25 mg total) by mouth 2 (two) times daily.  30 tablet  3  . Multiple Vitamin (MULTIVITAMIN) tablet Take 1 tablet by mouth daily.        Marland Kitchen omeprazole (PRILOSEC) 20 MG capsule Take 20 mg by mouth daily.      . quinapril (ACCUPRIL) 40 MG tablet TAKE 1 TABLET AT BEDTIME  90 tablet  0  . raloxifene (EVISTA) 60 MG tablet Take 1 tablet (60 mg total) by mouth daily.  30 tablet  11   No current facility-administered medications on file prior to visit.      Review of Systems Review of Systems  Constitutional: Negative for fever, appetite change, fatigue and unexpected weight change.  Eyes: Negative for pain and visual disturbance.  Respiratory: Negative for cough and shortness of breath.   Cardiovascular: Negative for cp or palpitations    Gastrointestinal: Negative for nausea, diarrhea and constipation.  Genitourinary: Negative for urgency and frequency. neg for excessive thirst  Skin: Negative for pallor or rash   Neurological: Negative for weakness, light-headedness, numbness and headaches.  Hematological: Negative for adenopathy. Does not bruise/bleed easily.  Psychiatric/Behavioral: Negative for dysphoric mood. The patient is not nervous/anxious.         Objective:   Physical Exam  Constitutional: She appears well-developed and well-nourished. No distress.  HENT:  Head: Normocephalic  and atraumatic.  Mouth/Throat: Oropharynx is clear and moist.  Eyes: Conjunctivae and EOM are normal. Pupils are equal, round, and reactive to light.  Neck: Normal range of motion. Neck  supple. No JVD present. Carotid bruit is not present. No thyromegaly present.  Cardiovascular: Normal rate, regular rhythm, normal heart sounds and intact distal pulses.  Exam reveals no gallop.   Pulmonary/Chest: Effort normal and breath sounds normal. No respiratory distress. She has no wheezes.  Abdominal: Soft. Bowel sounds are normal. She exhibits no distension, no abdominal bruit and no mass. There is no tenderness.  Musculoskeletal: She exhibits no edema and no tenderness.  Lymphadenopathy:    She has no cervical adenopathy.  Neurological: She is alert. She has normal reflexes. No cranial nerve deficit. She exhibits normal muscle tone. Coordination normal.  Skin: Skin is warm and dry. No rash noted. No erythema. No pallor.  Psychiatric: She has a normal mood and affect.          Assessment & Plan:

## 2012-09-04 NOTE — Assessment & Plan Note (Signed)
a1c is up  Sugar must be peaking at night  Will check sugar more often in pm- see AVS Will f/u with glucose log and also list of covered meds Will work on diet and exerice also

## 2012-09-04 NOTE — Patient Instructions (Addendum)
Sugar is up -not in control  So it much be peaking at times we are not aware of  Stick to a diabetic diet  Also aim - for exercise 5 days per week - walking or join the gym  Start checking some sugars at different times - 2 hours after dinner/ at bedtime/ occasionally late at night if you are awake  Follow up with me in 4-6 weeks with your blood glucose log so we can figure out what to do  Also bring a list of medicines your insurance covers for diabetes

## 2012-09-04 NOTE — Assessment & Plan Note (Signed)
Improved and doing well on current medicines  Will continue to monitor Disc goals in DM for bp control

## 2012-09-08 ENCOUNTER — Other Ambulatory Visit: Payer: Self-pay | Admitting: Family Medicine

## 2012-09-29 ENCOUNTER — Other Ambulatory Visit: Payer: Self-pay | Admitting: Family Medicine

## 2012-10-19 ENCOUNTER — Encounter: Payer: Self-pay | Admitting: Family Medicine

## 2012-10-19 ENCOUNTER — Ambulatory Visit (INDEPENDENT_AMBULATORY_CARE_PROVIDER_SITE_OTHER): Payer: Medicare Other | Admitting: Family Medicine

## 2012-10-19 VITALS — BP 128/76 | HR 75 | Temp 97.7°F | Ht 59.75 in | Wt 143.0 lb

## 2012-10-19 DIAGNOSIS — E119 Type 2 diabetes mellitus without complications: Secondary | ICD-10-CM | POA: Diagnosis not present

## 2012-10-19 DIAGNOSIS — J309 Allergic rhinitis, unspecified: Secondary | ICD-10-CM | POA: Diagnosis not present

## 2012-10-19 MED ORDER — FLUTICASONE PROPIONATE 50 MCG/ACT NA SUSP: 2.0000 | Freq: Every day | NASAL | Status: DC

## 2012-10-19 NOTE — Patient Instructions (Addendum)
Keep working on better diet (smaller lunch)  Stay active  Change metformin dosing to 2 pills in am and 2 at evening meal  Schedule labs in 2 months and then follow up

## 2012-10-19 NOTE — Assessment & Plan Note (Signed)
Refilled flonase today.  

## 2012-10-19 NOTE — Progress Notes (Signed)
Subjective:    Patient ID: [name], female    DOB: 1935-12-21, 77 y.o.   MRN: [id number]  HPI Here for f/u of DM  Diabetes Home sugar results - overall is stable - noticed that her sugars are highest in am or at lunchtime (also depends on what and how much she eats)  DM diet - is fair - she tries hard to stick to a DM diet -- (eating out is a challenge)  Exercise - is more active than she was (her leg does not bother her as much) Symptoms- none  A1C last  Lab Results  Component Value Date   HGBA1C 7.4* 09/01/2012  (this was up from 6.9)   She did not check to see what ins covers for DM   She needs a refill of flonase for sinus congestion  No problems with medications  Renal protection- on ace Last eye exam -is up to date   Wt is down 3 lb with bmi of 28 She is working hard on that    Patient Active Problem List   Diagnosis Date Noted  . Osteoporosis 03/02/2012  . Other screening mammogram 01/31/2012  . Post-menopausal 01/31/2012  . Vertigo 01/31/2012  . Tachycardia 09/11/2011  . Scoliosis   . ARM PAIN, RIGHT 03/29/2010  . ALLERGIC RHINITIS 08/12/2007  . ADVEF, DRUG/MEDICINAL/BIOLOGICAL SUBST NOS 08/20/2006  . HERPES SIMPLEX INFECTION 07/28/2006  . LYME DISEASE 07/28/2006  . DIABETES MELLITUS, TYPE II 07/28/2006  . HYPERLIPIDEMIA 07/28/2006  . DEPRESSION 07/28/2006  . HYPERTENSION 07/28/2006  . ASTHMA 07/28/2006  . OVERACTIVE BLADDER 07/28/2006  . OSTEOARTHRITIS 07/28/2006  . DEGENERATIVE DISC DISEASE 07/28/2006  . FIBROSITIS 07/28/2006  . SCOLIOSIS 07/28/2006  . LIVER FUNCTION TESTS, ABNORMAL 07/28/2006   Past Medical History  Diagnosis Date  . Asthma     childhood  . Depression   . Scoliosis     chronic back pain  . Diabetes mellitus     Type II  . HLD (hyperlipidemia)   . HTN (hypertension)   . Osteoarthritis     hands,elbow,knees  . Rhinitis, allergic   . Foot fracture, right 3/13  . H/O scoliosis   . Osteoporosis    Past Surgical  History  Procedure Laterality Date  . Tubal ligation      bilateral  . Bladder tack     History  Substance Use Topics  . Smoking status: Never Smoker   . Smokeless tobacco: Not on file  . Alcohol Use: No   Family History  Problem Relation Age of Onset  . Coronary artery disease Mother   . Cancer Mother     thyroid  . Heart attack Mother   . Coronary artery disease Father   . Heart attack Father   . Multiple sclerosis Sister    Allergies  Allergen Reactions  . Acetaminophen     REACTION: nausea  . Codeine     REACTION: itching  . Fluticasone Propionate     REACTION: palpitations  . Fosamax (Alendronate Sodium)     Difficulty swallowing and fever/ joint pain   . Naproxen     REACTION: GI  . Nsaids     REACTION: elevated LFT's  . Penicillins     REACTION: swelling at inj site   Current Outpatient Prescriptions on File Prior to Visit  Medication Sig Dispense Refill  . albuterol (PROVENTIL HFA;VENTOLIN HFA) 108 (90 BASE) MCG/ACT inhaler Inhale 2 puffs into the lungs every 4 (four) hours as needed for wheezing  or shortness of breath.  1 Inhaler  1  . amitriptyline (ELAVIL) 25 MG tablet TAKE  ONE TABLET BY MOUTH NIGHTLY AT BEDTIME  30 tablet  11  . amLODipine (NORVASC) 10 MG tablet TAKE ONE TABLET BY MOUTH EVERY DAY  30 tablet  9  . aspirin 81 MG tablet Take 81 mg by mouth daily.        . Calcium Carbonate-Vitamin D (CALCIUM 600+D) 600-400 MG-UNIT per tablet Take 2 tablets by mouth daily.        . fish oil-omega-3 fatty acids 1000 MG capsule Take 1 capsule by mouth 2 (two) times daily.       Marland Kitchen glipiZIDE (GLUCOTROL XL) 5 MG 24 hr tablet TAKE 2 TABLETS (10 MG TOTAL) BY MOUTH DAILY.  180 tablet  3  . glucose blood (FREESTYLE LITE) test strip TEST BLOOD SUGAR ONCE DAILY FOR DM 250.0  50 each  1  . Ibuprofen-Diphenhydramine Cit (ADVIL PM PO) Take by mouth as needed.       . Lancets 28G MISC by Does not apply route. Check sugar once daily and as needed for DM 250.0       .  meclizine (ANTIVERT) 25 MG tablet Take 25 mg by mouth as needed.      . metFORMIN (GLUCOPHAGE) 500 MG tablet TAKE 2 TABLETS EVERY MORNING, 1 AT LUNCH AND 1 AT SUPPER  360 tablet  1  . metoprolol tartrate (LOPRESSOR) 25 MG tablet Take 1 tablet (25 mg total) by mouth 2 (two) times daily.  30 tablet  3  . Multiple Vitamin (MULTIVITAMIN) tablet Take 1 tablet by mouth daily.        Marland Kitchen omeprazole (PRILOSEC) 20 MG capsule Take 20 mg by mouth daily.      . quinapril (ACCUPRIL) 40 MG tablet TAKE 1 TABLET AT BEDTIME  90 tablet  0  . raloxifene (EVISTA) 60 MG tablet Take 1 tablet (60 mg total) by mouth daily.  30 tablet  11   No current facility-administered medications on file prior to visit.     Review of Systems    Review of Systems  Constitutional: Negative for fever, appetite change, fatigue and unexpected weight change.  Eyes: Negative for pain and visual disturbance.  Respiratory: Negative for cough and shortness of breath.   Cardiovascular: Negative for cp or palpitations    Gastrointestinal: Negative for nausea, diarrhea and constipation.  Genitourinary: Negative for urgency and frequency. no excessive thirst  Skin: Negative for pallor or rash   Neurological: Negative for weakness, light-headedness, numbness and headaches.  Hematological: Negative for adenopathy. Does not bruise/bleed easily.  Psychiatric/Behavioral: Negative for dysphoric mood. The patient is not nervous/anxious.      Objective:   Physical Exam  Constitutional: She appears well-developed and well-nourished. No distress.  overwt and well app  HENT:  Head: Normocephalic and atraumatic.  Mouth/Throat: Oropharynx is clear and moist.  Eyes: Conjunctivae and EOM are normal. Pupils are equal, round, and reactive to light. Right eye exhibits no discharge. Left eye exhibits no discharge. No scleral icterus.  Neck: Normal range of motion. Neck supple. No JVD present. Carotid bruit is not present. No thyromegaly present.   Cardiovascular: Normal rate, regular rhythm, normal heart sounds and intact distal pulses.  Exam reveals no gallop.   Pulmonary/Chest: Effort normal and breath sounds normal. No respiratory distress. She has no wheezes.  Abdominal: Soft. Bowel sounds are normal. She exhibits no distension, no abdominal bruit and no mass. There is no tenderness.  Musculoskeletal: She exhibits no edema.  Lymphadenopathy:    She has no cervical adenopathy.  Neurological: She is alert. She has normal reflexes. No cranial nerve deficit. She exhibits normal muscle tone. Coordination normal.  Skin: Skin is warm and dry. No rash noted. No erythema. No pallor.  Psychiatric: She has a normal mood and affect.          Assessment & Plan:

## 2012-10-19 NOTE — Assessment & Plan Note (Signed)
Sugar peaks am and lunch Will change metformin dosing to 2 in am and 2 in pm  If no imp consider newer med - pt did not bring approved list  Will eat better- skip the large lunches

## 2012-10-26 ENCOUNTER — Other Ambulatory Visit: Payer: Self-pay | Admitting: Cardiovascular Disease

## 2012-10-26 ENCOUNTER — Other Ambulatory Visit: Payer: Self-pay | Admitting: Family Medicine

## 2012-10-27 NOTE — Telephone Encounter (Signed)
NO REFILLS UNTIL APPOINTMENT

## 2012-11-03 ENCOUNTER — Other Ambulatory Visit: Payer: Self-pay | Admitting: Family Medicine

## 2012-11-17 ENCOUNTER — Encounter: Payer: Self-pay | Admitting: Cardiovascular Disease

## 2012-11-17 ENCOUNTER — Ambulatory Visit (INDEPENDENT_AMBULATORY_CARE_PROVIDER_SITE_OTHER): Payer: Medicare Other | Admitting: Cardiovascular Disease

## 2012-11-17 VITALS — BP 128/62 | HR 89 | Ht 59.0 in | Wt 143.0 lb

## 2012-11-17 DIAGNOSIS — I10 Essential (primary) hypertension: Secondary | ICD-10-CM | POA: Diagnosis not present

## 2012-11-17 DIAGNOSIS — R000 Tachycardia, unspecified: Secondary | ICD-10-CM | POA: Diagnosis not present

## 2012-11-17 NOTE — Patient Instructions (Signed)
Your physician wants you to follow-up in: 6 months with Dr. Elease Hashimoto. You will receive a reminder letter in the mail two months in advance. If you don't receive a letter, please call our office to schedule the follow-up appointment.  Your physician recommends that you return FASTING for lab work in: 6 months (the day you see Dr. Elease Hashimoto)  Your physician recommends that you continue on your current medications as directed. Please refer to the Current Medication list given to you today.

## 2012-11-17 NOTE — Assessment & Plan Note (Signed)
[name] seems to be doing well. We'll continue with her same medications. She is limited by her knee pain and is not able to exercise much. I have encouraged her to exercise as much as possible. We'll see her again 6 months for followup visit. We'll check labs at that time.

## 2012-11-17 NOTE — Progress Notes (Signed)
[name] Date of Birth  03-07-1936       [name]    [facility] [address]  [address] [place], [place]  [address]   [place], [place]  [address] [phone]     [phone]   Fax  [phone]    Fax [phone]  Problem List: 1. Tachycardia 2. Hypertension 3. Diabetes Mellitus  History of Present Illness:  [name] was seen several months ago for palpitations.  She was started on Metoprolol and her HR is better.  She does not smoke.  She does not get any regular exercise.  She has had a fracture of her right foot and still is not able to walk much.  November 17, 2012:  [name] is doing well.  She has some pains from her right knee - needs replacement.      Current Outpatient Prescriptions on File Prior to Visit  Medication Sig Dispense Refill  . albuterol (PROVENTIL HFA;VENTOLIN HFA) 108 (90 BASE) MCG/ACT inhaler Inhale 2 puffs into the lungs every 4 (four) hours as needed for wheezing or shortness of breath.  1 Inhaler  1  . amitriptyline (ELAVIL) 25 MG tablet TAKE  ONE TABLET BY MOUTH NIGHTLY AT BEDTIME  30 tablet  11  . amLODipine (NORVASC) 10 MG tablet TAKE 1 TABLET BY MOUTH DAILY  30 tablet  5  . aspirin 81 MG tablet Take 81 mg by mouth daily.        . Calcium Carbonate-Vitamin D (CALCIUM 600+D) 600-400 MG-UNIT per tablet Take 2 tablets by mouth daily.        . fish oil-omega-3 fatty acids 1000 MG capsule Take 1 capsule by mouth 2 (two) times daily.       . fluticasone (FLONASE) 50 MCG/ACT nasal spray Place 2 sprays into the nose daily.  16 g  6  . glipiZIDE (GLUCOTROL XL) 5 MG 24 hr tablet TAKE 2 TABLETS (10 MG TOTAL) BY MOUTH DAILY.  180 tablet  3  . glucose blood (FREESTYLE LITE) test strip TEST BLOOD SUGAR ONCE DAILY FOR DM 250.0  50 each  1  . Ibuprofen-Diphenhydramine Cit (ADVIL PM PO) Take by mouth as needed.       . Lancets 28G MISC by Does not apply route. Check sugar once daily and as needed for DM 250.0        . meclizine (ANTIVERT) 25 MG tablet Take 25 mg by mouth as needed.      . metFORMIN (GLUCOPHAGE) 500 MG tablet Take 2 tablets in the am., 1 at noon and 1 in the pm.      . metoprolol tartrate (LOPRESSOR) 25 MG tablet TAKE 1 TABLET BY MOUTH TWICE A DAY  60 tablet  0  . Multiple Vitamin (MULTIVITAMIN) tablet Take 1 tablet by mouth daily.        Marland Kitchen omeprazole (PRILOSEC) 20 MG capsule Take 20 mg by mouth daily.      . quinapril (ACCUPRIL) 40 MG tablet TAKE 1 TABLET AT BEDTIME  90 tablet  0  . raloxifene (EVISTA) 60 MG tablet Take 1 tablet (60 mg total) by mouth daily.  30 tablet  11   No current facility-administered medications on file prior to visit.    Allergies  Allergen Reactions  . Acetaminophen     REACTION: nausea  . Codeine     REACTION: itching  . Fluticasone Propionate     REACTION: palpitations  . Fosamax (Alendronate  Sodium)     Difficulty swallowing and fever/ joint pain   . Naproxen     REACTION: GI  . Nsaids     REACTION: elevated LFT's  . Penicillins     REACTION: swelling at inj site    Past Medical History  Diagnosis Date  . Asthma     childhood  . Depression   . Scoliosis     chronic back pain  . Diabetes mellitus     Type II  . HLD (hyperlipidemia)   . HTN (hypertension)   . Osteoarthritis     hands,elbow,knees  . Rhinitis, allergic   . Foot fracture, right 3/13  . H/O scoliosis   . Osteoporosis     Past Surgical History  Procedure Laterality Date  . Tubal ligation      bilateral  . Bladder tack      History  Smoking status  . Never Smoker   Smokeless tobacco  . Not on file    History  Alcohol Use No    Family History  Problem Relation Age of Onset  . Coronary artery disease Mother   . Cancer Mother     thyroid  . Heart attack Mother   . Coronary artery disease Father   . Heart attack Father   . Multiple sclerosis Sister     Reviw of Systems:  Reviewed in the HPI.  All other systems are negative.  Physical  Exam: Blood pressure 128/62, pulse 89, height 4\' 11"  (1.499 m), weight 143 lb (64.864 kg), last menstrual period 04/22/1985. General: Well developed, well nourished, in no acute distress.  Head: Normocephalic, atraumatic, sclera non-icteric, mucus membranes are moist,   Neck: Supple. Carotids are 2 + without bruits. No JVD  Lungs: Clear bilaterally to auscultation.  Heart: regular rate.  normal  S1 S2. No murmurs, gallops or rubs.  Abdomen: Soft, non-tender, non-distended with normal bowel sounds. No hepatomegaly. No rebound/guarding. No masses.  Msk:  Strength and tone are normal  Extremities: No clubbing or cyanosis. No edema.  Distal pedal pulses are 2+ and equal bilaterally.  Neuro: Alert and oriented X 3. Moves all extremities spontaneously.  Psych:  Responds to questions appropriately with a normal affect.  ECG: 11/17/2012;    NSR at 89.  No ST or T wave changes  Assessment / Plan:

## 2012-11-25 ENCOUNTER — Other Ambulatory Visit: Payer: Self-pay | Admitting: Cardiovascular Disease

## 2012-12-15 ENCOUNTER — Other Ambulatory Visit (INDEPENDENT_AMBULATORY_CARE_PROVIDER_SITE_OTHER): Payer: Medicare Other

## 2012-12-15 DIAGNOSIS — E119 Type 2 diabetes mellitus without complications: Secondary | ICD-10-CM | POA: Diagnosis not present

## 2012-12-15 DIAGNOSIS — R945 Abnormal results of liver function studies: Secondary | ICD-10-CM

## 2012-12-15 LAB — COMPREHENSIVE METABOLIC PANEL
AST: 42 U/L — ABNORMAL HIGH (ref 0–37)
Albumin: 3.8 g/dL (ref 3.5–5.2)
BUN: 13 mg/dL (ref 6–23)
CO2: 27 mEq/L (ref 19–32)
Calcium: 9.7 mg/dL (ref 8.4–10.5)
Chloride: 104 mEq/L (ref 96–112)
Creatinine, Ser: 0.8 mg/dL (ref 0.4–1.2)
GFR: 72.22 mL/min (ref >60.00)
Glucose, Bld: 125 mg/dL — ABNORMAL HIGH (ref 70–99)
Potassium: 4.3 mEq/L (ref 3.5–5.1)

## 2012-12-15 LAB — HEMOGLOBIN A1C: Hgb A1c MFr Bld: 6.7 % — ABNORMAL HIGH (ref 4.6–6.5)

## 2012-12-22 ENCOUNTER — Encounter: Payer: Self-pay | Admitting: Family Medicine

## 2012-12-22 ENCOUNTER — Ambulatory Visit (INDEPENDENT_AMBULATORY_CARE_PROVIDER_SITE_OTHER): Payer: Medicare Other | Admitting: Family Medicine

## 2012-12-22 VITALS — BP 122/68 | HR 79 | Temp 98.6°F | Ht 59.75 in | Wt 149.2 lb

## 2012-12-22 DIAGNOSIS — Z23 Encounter for immunization: Secondary | ICD-10-CM | POA: Diagnosis not present

## 2012-12-22 DIAGNOSIS — E119 Type 2 diabetes mellitus without complications: Secondary | ICD-10-CM

## 2012-12-22 NOTE — Assessment & Plan Note (Signed)
Improved with increased metformin and improved diet  Urged to keep it up  Exercise as tolerated with knee pain  F/u 6 mo with labs prior

## 2012-12-22 NOTE — Patient Instructions (Addendum)
Flu shot today Continue current medicines  Check sugar once per day - some days in am before eating and other days 2 hours after a meal Follow up in 6 months with labs prior

## 2012-12-22 NOTE — Progress Notes (Signed)
Subjective:    Patient ID: [name], female    DOB: 1935/10/16, 77 y.o.   MRN: [id number]  HPI Here for f/u of chronic medical conditions   Feels fine overall   Diabetes Home sugar results - very very labile - and her sugar goes from 90s to occ over 200s  DM diet - has been very very careful about it  Exercise - is active/ no extra exercise (she has a hard time with that since she has chronic pain from knee OA) Symptoms-none  A1C last  Lab Results  Component Value Date   HGBA1C 6.7* 12/15/2012  this is down from 7.4 with inc in metformin dose  No problems with medications  Renal protection- on ace  Last eye exam 2/14  In addition- antidepressants have helped mood and sleep  Wt is up 6 lb with bmi of 29 Per pt - she said she gained much more and then lost 10 lb so far  Happy about that   Wants to get her flu shot today  Patient Active Problem List   Diagnosis Date Noted  . Osteoporosis 03/02/2012  . Other screening mammogram 01/31/2012  . Post-menopausal 01/31/2012  . Vertigo 01/31/2012  . Tachycardia 09/11/2011  . Scoliosis   . ARM PAIN, RIGHT 03/29/2010  . ALLERGIC RHINITIS 08/12/2007  . ADVEF, DRUG/MEDICINAL/BIOLOGICAL SUBST NOS 08/20/2006  . HERPES SIMPLEX INFECTION 07/28/2006  . LYME DISEASE 07/28/2006  . DIABETES MELLITUS, TYPE II 07/28/2006  . HYPERLIPIDEMIA 07/28/2006  . DEPRESSION 07/28/2006  . HYPERTENSION 07/28/2006  . ASTHMA 07/28/2006  . OVERACTIVE BLADDER 07/28/2006  . OSTEOARTHRITIS 07/28/2006  . DEGENERATIVE DISC DISEASE 07/28/2006  . FIBROSITIS 07/28/2006  . SCOLIOSIS 07/28/2006  . LIVER FUNCTION TESTS, ABNORMAL 07/28/2006   Past Medical History  Diagnosis Date  . Asthma     childhood  . Depression   . Scoliosis     chronic back pain  . Diabetes mellitus     Type II  . HLD (hyperlipidemia)   . HTN (hypertension)   . Osteoarthritis     hands,elbow,knees  . Rhinitis, allergic   . Foot fracture, right 3/13  . H/O scoliosis    . Osteoporosis    Past Surgical History  Procedure Laterality Date  . Tubal ligation      bilateral  . Bladder tack     History  Substance Use Topics  . Smoking status: Never Smoker   . Smokeless tobacco: Not on file  . Alcohol Use: No   Family History  Problem Relation Age of Onset  . Coronary artery disease Mother   . Cancer Mother     thyroid  . Heart attack Mother   . Coronary artery disease Father   . Heart attack Father   . Multiple sclerosis Sister    Allergies  Allergen Reactions  . Acetaminophen     REACTION: nausea  . Codeine     REACTION: itching  . Fluticasone Propionate     REACTION: palpitations  . Fosamax [Alendronate Sodium]     Difficulty swallowing and fever/ joint pain   . Naproxen     REACTION: GI  . Nsaids     REACTION: elevated LFT's  . Penicillins     REACTION: swelling at inj site   Current Outpatient Prescriptions on File Prior to Visit  Medication Sig Dispense Refill  . albuterol (PROVENTIL HFA;VENTOLIN HFA) 108 (90 BASE) MCG/ACT inhaler Inhale 2 puffs into the lungs every 4 (four) hours as needed for wheezing  or shortness of breath.  1 Inhaler  1  . amitriptyline (ELAVIL) 25 MG tablet TAKE  ONE TABLET BY MOUTH NIGHTLY AT BEDTIME  30 tablet  11  . amLODipine (NORVASC) 10 MG tablet TAKE 1 TABLET BY MOUTH DAILY  30 tablet  5  . aspirin 81 MG tablet Take 81 mg by mouth daily.        . Calcium Carbonate-Vitamin D (CALCIUM 600+D) 600-400 MG-UNIT per tablet Take 2 tablets by mouth daily.        . fish oil-omega-3 fatty acids 1000 MG capsule Take 1 capsule by mouth 2 (two) times daily.       . fluticasone (FLONASE) 50 MCG/ACT nasal spray Place 2 sprays into the nose daily.  16 g  6  . glipiZIDE (GLUCOTROL XL) 5 MG 24 hr tablet TAKE 2 TABLETS (10 MG TOTAL) BY MOUTH DAILY.  180 tablet  3  . glucose blood (FREESTYLE LITE) test strip TEST BLOOD SUGAR ONCE DAILY FOR DM 250.0  50 each  1  . Ibuprofen-Diphenhydramine Cit (ADVIL PM PO) Take by mouth as  needed.       . Lancets 28G MISC by Does not apply route. Check sugar once daily and as needed for DM 250.0       . meclizine (ANTIVERT) 25 MG tablet Take 25 mg by mouth as needed.      . metFORMIN (GLUCOPHAGE) 500 MG tablet Take 2 tablets in the am., 1 at noon and 1 in the pm.      . metoprolol tartrate (LOPRESSOR) 25 MG tablet TAKE 1 TABLET BY MOUTH TWICE A DAY  60 tablet  6  . Multiple Vitamin (MULTIVITAMIN) tablet Take 1 tablet by mouth daily.        Marland Kitchen omeprazole (PRILOSEC) 20 MG capsule Take 20 mg by mouth daily.      . quinapril (ACCUPRIL) 40 MG tablet TAKE 1 TABLET AT BEDTIME  90 tablet  0  . raloxifene (EVISTA) 60 MG tablet Take 1 tablet (60 mg total) by mouth daily.  30 tablet  11   No current facility-administered medications on file prior to visit.    Review of Systems Review of Systems  Constitutional: Negative for fever, appetite change, fatigue and unexpected weight change.  Eyes: Negative for pain and visual disturbance.  Respiratory: Negative for cough and shortness of breath.   Cardiovascular: Negative for cp or palpitations    Gastrointestinal: Negative for nausea, diarrhea and constipation.  Genitourinary: Negative for urgency and frequency.  Skin: Negative for pallor or rash   Neurological: Negative for weakness, light-headedness, numbness and headaches.  Hematological: Negative for adenopathy. Does not bruise/bleed easily.  Psychiatric/Behavioral: Negative for dysphoric mood. The patient is not nervous/anxious.         Objective:   Physical Exam  Constitutional: She appears well-developed and well-nourished. No distress.  obese and well appearing   HENT:  Head: Normocephalic and atraumatic.  Mouth/Throat: Oropharynx is clear and moist.  Eyes: Conjunctivae and EOM are normal. Pupils are equal, round, and reactive to light. No scleral icterus.  Neck: Normal range of motion. Neck supple. No JVD present. Carotid bruit is not present. No thyromegaly present.   Cardiovascular: Normal rate, regular rhythm and intact distal pulses.  Exam reveals no gallop.   Pulmonary/Chest: Effort normal and breath sounds normal. No respiratory distress. She has no wheezes. She has no rales.  Abdominal: Soft. Bowel sounds are normal. She exhibits no distension, no abdominal bruit and no mass.  There is no tenderness.  Musculoskeletal: She exhibits no edema.  Poor rom R knee  Lymphadenopathy:    She has no cervical adenopathy.  Neurological: She is alert. She has normal reflexes. No cranial nerve deficit. She exhibits normal muscle tone. Coordination normal.  Skin: Skin is warm and dry. No rash noted. No erythema. No pallor.  Psychiatric: She has a normal mood and affect.          Assessment & Plan:

## 2013-01-14 ENCOUNTER — Other Ambulatory Visit: Payer: Self-pay | Admitting: Family Medicine

## 2013-01-24 ENCOUNTER — Other Ambulatory Visit: Payer: Self-pay | Admitting: Family Medicine

## 2013-03-23 ENCOUNTER — Other Ambulatory Visit: Payer: Self-pay | Admitting: Family Medicine

## 2013-03-29 ENCOUNTER — Other Ambulatory Visit: Payer: Self-pay

## 2013-03-29 MED ORDER — METOPROLOL TARTRATE 25 MG PO TABS: ORAL_TABLET | ORAL | Status: DC

## 2013-03-29 MED ORDER — AMLODIPINE BESYLATE 10 MG PO TABS: ORAL_TABLET | ORAL | Status: DC

## 2013-03-31 ENCOUNTER — Other Ambulatory Visit: Payer: Self-pay

## 2013-03-31 MED ORDER — METOPROLOL TARTRATE 25 MG PO TABS: ORAL_TABLET | ORAL | Status: DC

## 2013-05-17 ENCOUNTER — Ambulatory Visit (INDEPENDENT_AMBULATORY_CARE_PROVIDER_SITE_OTHER): Payer: Medicare Other | Admitting: Cardiovascular Disease

## 2013-05-17 ENCOUNTER — Encounter: Payer: Self-pay | Admitting: Cardiovascular Disease

## 2013-05-17 VITALS — BP 127/75 | HR 75 | Ht 61.0 in | Wt 139.5 lb

## 2013-05-17 DIAGNOSIS — R000 Tachycardia, unspecified: Secondary | ICD-10-CM | POA: Diagnosis not present

## 2013-05-17 DIAGNOSIS — R0602 Shortness of breath: Secondary | ICD-10-CM | POA: Diagnosis not present

## 2013-05-17 DIAGNOSIS — I10 Essential (primary) hypertension: Secondary | ICD-10-CM | POA: Diagnosis not present

## 2013-05-17 NOTE — Patient Instructions (Signed)
Your physician wants you to follow-up in: 1 year with EKG. You will receive a reminder letter in the mail two months in advance. If you don't receive a letter, please call our office to schedule the follow-up appointment.   Your physician recommends that you continue on your current medications as directed. Please refer to the Current Medication list given to you today.

## 2013-05-17 NOTE — Assessment & Plan Note (Addendum)
She is doing well.  No complaints.   i will see her in 1 year.

## 2013-05-17 NOTE — Progress Notes (Signed)
[name] Date of Birth  [birth year]       [facility]    [place] [address]  [address] [place], [place]  [address]   [place], [place]  [address] [phone]     [phone]   Fax  [phone]    Fax [phone]  Problem List: 1. Tachycardia 2. Hypertension 3. Diabetes Mellitus  History of Present Illness:  [name] was seen several months ago for palpitations.  She was started on Metoprolol and her HR is better.  She does not smoke.  She does not get any regular exercise.  She has had a fracture of her right foot and still is not able to walk much.  November 17, 2012:  [name] is doing well.  She has some pains from her right knee - needs replacement.      Jan. 26, 2015:  [name] is doing well.  She has not had her knee replacement.    She does have some mild DOE but most likely due to deconditioning.    Current Outpatient Prescriptions on File Prior to Visit  Medication Sig Dispense Refill  . albuterol (PROVENTIL HFA;VENTOLIN HFA) 108 (90 BASE) MCG/ACT inhaler Inhale 2 puffs into the lungs every 4 (four) hours as needed for wheezing or shortness of breath.  1 Inhaler  1  . amitriptyline (ELAVIL) 25 MG tablet TAKE  ONE TABLET BY MOUTH NIGHTLY AT BEDTIME  30 tablet  11  . amLODipine (NORVASC) 10 MG tablet TAKE 1 TABLET BY MOUTH DAILY  30 tablet  5  . aspirin 81 MG tablet Take 81 mg by mouth daily.        . Calcium Carbonate-Vitamin D (CALCIUM 600+D) 600-400 MG-UNIT per tablet Take 2 tablets by mouth daily.        . fish oil-omega-3 fatty acids 1000 MG capsule Take 1 capsule by mouth 2 (two) times daily.       . fluticasone (FLONASE) 50 MCG/ACT nasal spray Place 2 sprays into the nose daily.  16 g  6  . glipiZIDE (GLUCOTROL XL) 5 MG 24 hr tablet TAKE 2 TABLETS (10 MG TOTAL) BY MOUTH DAILY.  180 tablet  1  . glucose blood (FREESTYLE LITE) test strip TEST BLOOD SUGAR ONCE DAILY FOR DM 250.0  50 each  1  .  Ibuprofen-Diphenhydramine Cit (ADVIL PM PO) Take by mouth as needed.       . Lancets 28G MISC by Does not apply route. Check sugar once daily and as needed for DM 250.0       . meclizine (ANTIVERT) 25 MG tablet Take 25 mg by mouth as needed.      . metFORMIN (GLUCOPHAGE) 500 MG tablet TAKE 2 TABLETS BY MOUTH EVERY MORNING, 1 TAB AT LUNCH AND 1 TAB AT SUPPER  360 tablet  1  . metoprolol tartrate (LOPRESSOR) 25 MG tablet TAKE 1 TABLET BY MOUTH TWICE A DAY  180 tablet  2  . Multiple Vitamin (MULTIVITAMIN) tablet Take 1 tablet by mouth daily.        Marland Kitchen omeprazole (PRILOSEC) 20 MG capsule Take 20 mg by mouth daily.      . quinapril (ACCUPRIL) 40 MG tablet TAKE 1 TABLET AT BEDTIME  90 tablet  1   No current facility-administered medications on file prior to visit.    Allergies  Allergen Reactions  . Acetaminophen     REACTION: nausea  . Codeine  REACTION: itching  . Fluticasone Propionate     REACTION: palpitations  . Fosamax [Alendronate Sodium]     Difficulty swallowing and fever/ joint pain   . Naproxen     REACTION: GI  . Nsaids     REACTION: elevated LFT's  . Penicillins     REACTION: swelling at inj site    Past Medical History  Diagnosis Date  . Asthma     childhood  . Depression   . Scoliosis     chronic back pain  . Diabetes mellitus     Type II  . HLD (hyperlipidemia)   . HTN (hypertension)   . Osteoarthritis     hands,elbow,knees  . Rhinitis, allergic   . Foot fracture, right 3/13  . H/O scoliosis   . Osteoporosis     Past Surgical History  Procedure Laterality Date  . Tubal ligation      bilateral  . Bladder tack      History  Smoking status  . Never Smoker   Smokeless tobacco  . Not on file    History  Alcohol Use No    Family History  Problem Relation Age of Onset  . Coronary artery disease Mother   . Cancer Mother     thyroid  . Heart attack Mother   . Coronary artery disease Father   . Heart attack Father   . Multiple sclerosis  Sister     Reviw of Systems:  Reviewed in the HPI.  All other systems are negative.  Physical Exam: Blood pressure 127/75, pulse 75, height 5\' 1"  (1.549 m), weight 139 lb 8 oz (63.277 kg), last menstrual period 04/22/1985. General: Well developed, well nourished, in no acute distress.  Head: Normocephalic, atraumatic, sclera non-icteric, mucus membranes are moist,   Neck: Supple. Carotids are 2 + without bruits. No JVD  Lungs: Clear bilaterally to auscultation.  Heart: regular rate.  normal  S1 S2. No murmurs, gallops or rubs.  Abdomen: Soft, non-tender, non-distended with normal bowel sounds. No hepatomegaly. No rebound/guarding. No masses.  Msk:  Strength and tone are normal  Extremities: No clubbing or cyanosis. No edema.  Distal pedal pulses are 2+ and equal bilaterally.  Neuro: Alert and oriented X 3. Moves all extremities spontaneously.  Gait is a bit slow.  She has a bad right knee.   She favors her right leg.    Psych:  Responds to questions appropriately with a normal affect.  ECG: 11/17/2012;    NSR at 89.  No ST or T wave changes  Assessment / Plan:

## 2013-05-23 ENCOUNTER — Other Ambulatory Visit: Payer: Self-pay | Admitting: Family Medicine

## 2013-05-24 NOTE — Telephone Encounter (Signed)
Electronic refill request, please advise  

## 2013-05-24 NOTE — Telephone Encounter (Signed)
done

## 2013-05-24 NOTE — Telephone Encounter (Signed)
Please refill for 3 mo - she has upcoming f/u-thanks

## 2013-06-15 ENCOUNTER — Other Ambulatory Visit: Payer: Medicare Other

## 2013-06-18 ENCOUNTER — Other Ambulatory Visit: Payer: Medicare Other

## 2013-06-18 ENCOUNTER — Other Ambulatory Visit (INDEPENDENT_AMBULATORY_CARE_PROVIDER_SITE_OTHER): Payer: Medicare Other

## 2013-06-18 DIAGNOSIS — E119 Type 2 diabetes mellitus without complications: Secondary | ICD-10-CM | POA: Diagnosis not present

## 2013-06-18 LAB — COMPREHENSIVE METABOLIC PANEL
ALT: 29 U/L (ref 0–35)
AST: 27 U/L (ref 0–37)
Albumin: 3.9 g/dL (ref 3.5–5.2)
Alkaline Phosphatase: 39 U/L (ref 39–117)
BUN: 13 mg/dL (ref 6–23)
CO2: 29 mEq/L (ref 19–32)
Calcium: 10.3 mg/dL (ref 8.4–10.5)
Chloride: 104 mEq/L (ref 96–112)
Creatinine, Ser: 0.9 mg/dL (ref 0.4–1.2)
GFR: 69.19 mL/min (ref >60.00)
GLUCOSE: 113 mg/dL — AB (ref 70–99)
POTASSIUM: 4.3 meq/L (ref 3.5–5.1)
Sodium: 138 mEq/L (ref 135–145)
Total Bilirubin: 0.4 mg/dL (ref 0.3–1.2)
Total Protein: 7.5 g/dL (ref 6.0–8.3)

## 2013-06-18 LAB — HEMOGLOBIN A1C: HEMOGLOBIN A1C: 6.4 % (ref 4.6–6.5)

## 2013-06-18 LAB — LIPID PANEL
CHOLESTEROL: 177 mg/dL (ref 0–200)
HDL: 40.5 mg/dL (ref >39.00)
LDL CALC: 117 mg/dL — AB (ref 0–99)
TRIGLYCERIDES: 100 mg/dL (ref 0.0–149.0)
Total CHOL/HDL Ratio: 4
VLDL: 20 mg/dL (ref 0.0–40.0)

## 2013-06-22 ENCOUNTER — Encounter: Payer: Self-pay | Admitting: Family Medicine

## 2013-06-22 ENCOUNTER — Ambulatory Visit (INDEPENDENT_AMBULATORY_CARE_PROVIDER_SITE_OTHER): Payer: Medicare Other | Admitting: Family Medicine

## 2013-06-22 VITALS — BP 114/60 | HR 84 | Temp 98.9°F | Ht 59.5 in | Wt 135.0 lb

## 2013-06-22 DIAGNOSIS — E785 Hyperlipidemia, unspecified: Secondary | ICD-10-CM

## 2013-06-22 DIAGNOSIS — J209 Acute bronchitis, unspecified: Secondary | ICD-10-CM

## 2013-06-22 DIAGNOSIS — Z0000 Encounter for general adult medical examination without abnormal findings: Secondary | ICD-10-CM | POA: Diagnosis not present

## 2013-06-22 DIAGNOSIS — I10 Essential (primary) hypertension: Secondary | ICD-10-CM

## 2013-06-22 DIAGNOSIS — E119 Type 2 diabetes mellitus without complications: Secondary | ICD-10-CM | POA: Diagnosis not present

## 2013-06-22 DIAGNOSIS — M810 Age-related osteoporosis without current pathological fracture: Secondary | ICD-10-CM

## 2013-06-22 DIAGNOSIS — Z1211 Encounter for screening for malignant neoplasm of colon: Secondary | ICD-10-CM | POA: Insufficient documentation

## 2013-06-22 MED ORDER — ALBUTEROL SULFATE HFA 108 (90 BASE) MCG/ACT IN AERS: 2.0000 | INHALATION_SPRAY | RESPIRATORY_TRACT | Status: DC | PRN

## 2013-06-22 MED ORDER — AZITHROMYCIN 250 MG PO TABS: ORAL_TABLET | ORAL | Status: DC

## 2013-06-22 MED ORDER — GLUCOSE BLOOD VI STRP: ORAL_STRIP | Status: DC

## 2013-06-22 NOTE — Patient Instructions (Signed)
Take zithromax for bronchitis Use inhaler as needed  Drink lots of fluids Update if not starting to improve in a week or if worsening  Please do stool card for colon cancer screening

## 2013-06-22 NOTE — Progress Notes (Signed)
Subjective:    Patient ID: Sheila FinnerPatricia A Williams, female    DOB: Jan 20, 1936, 78 y.o.   MRN: [id number]  HPI I have personally reviewed the Medicare Annual Wellness questionnaire and have noted 1. The patient's medical and social history 2. Their use of alcohol, tobacco or illicit drugs 3. Their current medications and supplements 4. The patient's functional ability including ADL's, fall risks, home safety risks and hearing or visual             impairment. 5. Diet and physical activities 6. Evidence for depression or mood disorders  The patients weight, height, BMI have been recorded in the chart and visual acuity is per eye clinic.  I have made referrals, counseling and provided education to the patient based review of the above and I have provided the pt with a written personalized care plan for preventive services.  Also sick for 2 weeks  Thinks she had the flu -and now she still has congestion and wheezing and sob  She had fever of 102 -gone now  Just the resp symptoms persist  No facial pain / a little runny nose    See scanned forms.  Routine anticipatory guidance given to patient.  See health maintenance. Flu 9/14 vaccine  Shingles 4/13 vaccine  PNA 3/07 vaccine  Tetanus shot 10/13  Colon cancer screen- declines colonoscopy  Breast cancer screening 11/13 mammogram  She declines a mammogram this year  No new lumps on self exam  No gyn problems or symptoms  Advance directive -she has a living will  Cognitive function addressed- see scanned forms- and if abnormal then additional documentation follows. -no problems with that   PMH and SH reviewed  Meds, vitals, and allergies reviewed.   ROS: See HPI.  Otherwise negative.    OP dexa 10/13 Could not take fosamax- gave her diarrhea  She takes her ca and D  She does a lot of physical work - and goes up and down stairs all day long   bp is stable today  No cp or palpitations or headaches or edema  No side effects to  medicines  BP Readings from Last 3 Encounters:  06/22/13 114/60  05/17/13 127/75  12/22/12 122/68     Hx of high lipids Lab Results  Component Value Date   CHOL 177 06/18/2013   CHOL 174 01/20/2012   CHOL 167 07/12/2011   Lab Results  Component Value Date   HDL 40.50 06/18/2013   HDL 88.4136.50* 01/20/2012   HDL 46.50 07/12/2011   Lab Results  Component Value Date   LDLCALC 117* 06/18/2013   LDLCALC 109* 01/20/2012   LDLCALC 103* 07/12/2011   Lab Results  Component Value Date   TRIG 100.0 06/18/2013   TRIG 145.0 01/20/2012   TRIG 86.0 07/12/2011   Lab Results  Component Value Date   CHOLHDL 4 06/18/2013   CHOLHDL 5 01/20/2012   CHOLHDL 4 07/12/2011   No results found for this basename: LDLDIRECT   overall stable Pt declines chol med    DM diet - less appetite -she has lost a bit of wt when sick too Exercise lots of stairs  Symptoms none  A1C last  Lab Results  Component Value Date   HGBA1C 6.4 06/18/2013   That is down from 6.7  No problems with medications  Renal protection on ace  Last eye exam 2/14- she will schedule annual    Chemistry      Component Value Date/Time   NA  138 06/18/2013 1018   K 4.3 06/18/2013 1018   CL 104 06/18/2013 1018   CO2 29 06/18/2013 1018   BUN 13 06/18/2013 1018   CREATININE 0.9 06/18/2013 1018   CREATININE 0.81 01/11/2011 1406      Component Value Date/Time   CALCIUM 10.3 06/18/2013 1018   ALKPHOS 39 06/18/2013 1018   AST 27 06/18/2013 1018   ALT 29 06/18/2013 1018   BILITOT 0.4 06/18/2013 1018       Review of Systems Review of Systems  Constitutional: Negative for fever, appetite change,  and unexpected weight change.   ENt pos for nasal congestion/ st  Eyes: Negative for pain and visual disturbance.  Respiratory: Negative for  shortness of breath.  pos for cough and wheeze  Cardiovascular: Negative for cp or palpitations    Gastrointestinal: Negative for nausea, diarrhea and constipation.  Genitourinary: Negative for urgency and  frequency.  Skin: Negative for pallor or rash   Neurological: Negative for weakness, light-headedness, numbness and headaches.  Hematological: Negative for adenopathy. Does not bruise/bleed easily.  Psychiatric/Behavioral: Negative for dysphoric mood. The patient is not nervous/anxious.         Objective:   Physical Exam  Constitutional: She appears well-developed and well-nourished. No distress.  overwt and well (but fatigued) appearing with wet cough  HENT:  Head: Normocephalic and atraumatic.  Right Ear: External ear normal.  Left Ear: External ear normal.  Mouth/Throat: Oropharynx is clear and moist.  Eyes: Conjunctivae and EOM are normal. Pupils are equal, round, and reactive to light. No scleral icterus.  Neck: Normal range of motion. Neck supple. No JVD present. Carotid bruit is not present. No thyromegaly present.  Cardiovascular: Normal rate, regular rhythm, normal heart sounds and intact distal pulses.  Exam reveals no gallop.   Pulmonary/Chest: Effort normal. No respiratory distress. She has wheezes. She exhibits no tenderness.  Harsh bs with scattered rhonchi No rales Wheeze only on forced exp  Abdominal: Soft. Bowel sounds are normal. She exhibits no distension, no abdominal bruit and no mass. There is no tenderness.  Genitourinary: No breast swelling, tenderness, discharge or bleeding.  Musculoskeletal: Normal range of motion. She exhibits no edema and no tenderness.  Lymphadenopathy:    She has no cervical adenopathy.  Neurological: She is alert. She has normal reflexes. No cranial nerve deficit. She exhibits normal muscle tone. Coordination normal.  Skin: Skin is warm and dry. No rash noted. No erythema. No pallor.  Psychiatric: She has a normal mood and affect.          Assessment & Plan:

## 2013-06-22 NOTE — Progress Notes (Signed)
Pre visit review using our clinic review tool, if applicable. No additional management support is needed unless otherwise documented below in the visit note. 

## 2013-06-23 ENCOUNTER — Telehealth: Payer: Self-pay | Admitting: Family Medicine

## 2013-06-23 MED ORDER — AMLODIPINE BESYLATE 10 MG PO TABS: ORAL_TABLET | ORAL | Status: DC

## 2013-06-23 MED ORDER — QUINAPRIL HCL 40 MG PO TABS: ORAL_TABLET | ORAL | Status: DC

## 2013-06-23 MED ORDER — METFORMIN HCL 500 MG PO TABS: ORAL_TABLET | ORAL | Status: DC

## 2013-06-23 MED ORDER — GLIPIZIDE ER 5 MG PO TB24: ORAL_TABLET | ORAL | Status: DC

## 2013-06-23 NOTE — Telephone Encounter (Signed)
Relevant patient education mailed to patient.  

## 2013-06-23 NOTE — Assessment & Plan Note (Signed)
Lab Results  Component Value Date   HGBA1C 6.4 06/18/2013   Imp with better habits Reminded to get eye exam this mo

## 2013-06-23 NOTE — Assessment & Plan Note (Signed)
Cover with zpak Likely started with flu  Disc symptomatic care - see instructions on AVS Update if not starting to improve in a week or if worsening  Consider cxr if no imp

## 2013-06-23 NOTE — Assessment & Plan Note (Signed)
D/w patient re:options for colon cancer screening, including IFOB vs. colonoscopy.  Risks and benefits of both were discussed and patient voiced understanding.  Pt elects for: ifob.    

## 2013-06-23 NOTE — Assessment & Plan Note (Signed)
BP: 114/60 mmHg  bp in fair control at this time  No changes needed Disc lifstyle change with low sodium diet and exercise

## 2013-06-23 NOTE — Assessment & Plan Note (Signed)
Reviewed health habits including diet and exercise and skin cancer prevention Reviewed appropriate screening tests for age  Also reviewed health mt list, fam hx and immunization status , as well as social and family history   See HPI Lab rev Declines mammo/ colonoscopy/chol med

## 2013-06-23 NOTE — Assessment & Plan Note (Signed)
Disc goals for lipids and reasons to control them Rev labs with pt Rev low sat fat diet in detail Pt declines chol med of any kind

## 2013-06-23 NOTE — Assessment & Plan Note (Signed)
Pt intol of fosamax No fragility fx Disc need for calcium/ vitamin D/ wt bearing exercise and bone density test every 2 y to monitor Disc safety/ fracture risk in detail   dexa less than 2 y ago

## 2013-07-27 ENCOUNTER — Telehealth: Payer: Self-pay

## 2013-07-27 NOTE — Telephone Encounter (Signed)
Pt upset; pt was seen 06/22/13 for physical; pt said she was billed for 2 visits, $345 for CPX and $200 + for sick visit. I tried to explain sick/well visit info from [employer] flyer. Pt said she did not ask for med for cough. Dr Milinda Antisower wrote rx on her own and now medicare has denied sick visit charge. Pt said she was seen for 20 mins and charged $595.00. Pt did not want [facility]. Pt will get another doctor and pt said she will not pay this bill. Pt suggest someone call medicare and explain the sick/well visit policy to them and get them to pay this visit. I lost connection with pt and I called back to apologize for loss connection and pt said she was finished and hung up. Call ended.

## 2013-07-28 NOTE — Telephone Encounter (Signed)
Sent an Armed forces technical officer-mail to Psychologist, occupationalcharge correction.  The pts 06/22/13 OV was sent with v70.0 and that shouldn't have been the primary dx.  Charge Correction will correct the diagnosis codes and refile.

## 2013-09-20 ENCOUNTER — Other Ambulatory Visit: Payer: Self-pay | Admitting: Family Medicine

## 2013-10-06 ENCOUNTER — Other Ambulatory Visit: Payer: Self-pay | Admitting: Family Medicine

## 2013-10-07 NOTE — Telephone Encounter (Signed)
Last filled 05/23/2013 x 2.

## 2013-10-08 NOTE — Telephone Encounter (Signed)
Sent. Thanks.   

## 2013-11-02 ENCOUNTER — Other Ambulatory Visit: Payer: Self-pay | Admitting: Family Medicine

## 2013-11-02 NOTE — Telephone Encounter (Signed)
Please refill for a year  

## 2013-11-02 NOTE — Telephone Encounter (Signed)
done

## 2013-11-02 NOTE — Telephone Encounter (Signed)
Electronic refill request, please advise  

## 2013-11-09 ENCOUNTER — Telehealth: Payer: Self-pay | Admitting: Family Medicine

## 2013-11-09 NOTE — Telephone Encounter (Signed)
No -please do not, thanks

## 2013-11-09 NOTE — Telephone Encounter (Signed)
Diabetic Bundle.  Report states to recheck LDL.  Most recent results on 06/18/13 were 117.  Last OV note states:  HYPERLIPIDEMIA - Judy PimpleMarne A Tower, MD at 06/23/2013 7:33 AM     Status: Written Related Problem: HYPERLIPIDEMIA    Disc goals for lipids and reasons to control them  Rev labs with pt  Rev low sat fat diet in detail  Pt declines chol med of any kind    Do you want me to schedule lab appt to recheck LDL?

## 2014-01-13 ENCOUNTER — Other Ambulatory Visit: Payer: Self-pay | Admitting: Cardiovascular Disease

## 2014-02-15 DIAGNOSIS — Z23 Encounter for immunization: Secondary | ICD-10-CM | POA: Diagnosis not present

## 2014-03-20 ENCOUNTER — Telehealth: Payer: Self-pay | Admitting: Family Medicine

## 2014-03-20 DIAGNOSIS — E119 Type 2 diabetes mellitus without complications: Secondary | ICD-10-CM

## 2014-03-20 DIAGNOSIS — E785 Hyperlipidemia, unspecified: Secondary | ICD-10-CM

## 2014-03-20 NOTE — Telephone Encounter (Signed)
-----   Message from Alvina Chouerri J Walsh sent at 03/16/2014 10:17 AM EST ----- Regarding: Lab orders for Monday, 11.30.15 Lab orders for a 6-8 month f/u

## 2014-03-21 ENCOUNTER — Other Ambulatory Visit (INDEPENDENT_AMBULATORY_CARE_PROVIDER_SITE_OTHER): Payer: Medicare Other

## 2014-03-21 DIAGNOSIS — E119 Type 2 diabetes mellitus without complications: Secondary | ICD-10-CM

## 2014-03-21 DIAGNOSIS — E785 Hyperlipidemia, unspecified: Secondary | ICD-10-CM | POA: Diagnosis not present

## 2014-03-21 LAB — LIPID PANEL
Cholesterol: 195 mg/dL (ref 0–200)
HDL: 36.8 mg/dL — AB (ref >39.00)
LDL Cholesterol: 135 mg/dL — ABNORMAL HIGH (ref 0–99)
NONHDL: 158.2
Total CHOL/HDL Ratio: 5
Triglycerides: 117 mg/dL (ref 0.0–149.0)
VLDL: 23.4 mg/dL (ref 0.0–40.0)

## 2014-03-21 LAB — HEMOGLOBIN A1C: HEMOGLOBIN A1C: 6.3 % (ref 4.6–6.5)

## 2014-03-25 ENCOUNTER — Encounter: Payer: Self-pay | Admitting: Family Medicine

## 2014-03-25 ENCOUNTER — Encounter: Payer: Self-pay | Admitting: Cardiovascular Disease

## 2014-03-25 ENCOUNTER — Ambulatory Visit (INDEPENDENT_AMBULATORY_CARE_PROVIDER_SITE_OTHER): Payer: Medicare Other | Admitting: Cardiovascular Disease

## 2014-03-25 ENCOUNTER — Ambulatory Visit (INDEPENDENT_AMBULATORY_CARE_PROVIDER_SITE_OTHER): Payer: Medicare Other | Admitting: Family Medicine

## 2014-03-25 VITALS — BP 128/64 | HR 83 | Temp 98.1°F | Ht 59.5 in | Wt 133.0 lb

## 2014-03-25 VITALS — BP 140/72 | HR 76 | Ht 61.0 in | Wt 133.5 lb

## 2014-03-25 DIAGNOSIS — R000 Tachycardia, unspecified: Secondary | ICD-10-CM | POA: Diagnosis not present

## 2014-03-25 DIAGNOSIS — E785 Hyperlipidemia, unspecified: Secondary | ICD-10-CM

## 2014-03-25 DIAGNOSIS — I10 Essential (primary) hypertension: Secondary | ICD-10-CM | POA: Diagnosis not present

## 2014-03-25 DIAGNOSIS — E119 Type 2 diabetes mellitus without complications: Secondary | ICD-10-CM | POA: Diagnosis not present

## 2014-03-25 MED ORDER — ATORVASTATIN CALCIUM 40 MG PO TABS: 40.0000 mg | ORAL_TABLET | Freq: Every day | ORAL | Status: DC

## 2014-03-25 NOTE — Assessment & Plan Note (Signed)
Her LDL is now 135.  She has diabetes and a family hx of heart disease .  I think she would benefit from a statin. Will start Atorvastatin 20 a day Will check lipids, liver, BMP in 3 months. I will see her in 1 year.

## 2014-03-25 NOTE — Progress Notes (Signed)
[name] Date of Birth  Jul 18, 1935       [facility]    [facility] [place] [facility] [address], [address]  [address], [address] [place], [place]  [id number]   [place], [place]  [id number] 564 [phone]     [phone]   Fax  [phone]    Fax [phone]  Problem List: 1. Tachycardia 2. Hypertension 3. Diabetes Mellitus 4. hyperlipidemia  History of Present Illness:  [name] was seen several months ago for palpitations.  She was started on Metoprolol and her HR is better.  She does not smoke.  She does not get any regular exercise.  She has had a fracture of her right foot and still is not able to walk much.  November 17, 2012:  [name] is doing well.  She has some pains from her right knee - needs replacement.      Jan. 26, 2015:  [name] is doing well.  She has not had her knee replacement.    She does have some mild DOE but most likely due to deconditioning.    Dec. 4, 2015:  [name] is a 78 yo who is followed for HTN and tachycardia.    Current Outpatient Prescriptions on File Prior to Visit  Medication Sig Dispense Refill  . albuterol (PROVENTIL HFA;VENTOLIN HFA) 108 (90 BASE) MCG/ACT inhaler Inhale 2 puffs into the lungs every 4 (four) hours as needed for wheezing or shortness of breath. 1 Inhaler 1  . albuterol (PROVENTIL HFA;VENTOLIN HFA) 108 (90 BASE) MCG/ACT inhaler Inhale 2 puffs into the lungs every 4 (four) hours as needed for wheezing. 1 Inhaler 3  . amitriptyline (ELAVIL) 25 MG tablet TAKE 1 TABLET BY MOUTH AT BEDTIME 30 tablet 11  . amLODipine (NORVASC) 10 MG tablet TAKE 1 TABLET BY MOUTH DAILY 90 tablet 3  . aspirin 81 MG tablet Take 81 mg by mouth daily.      Marland Kitchen. azithromycin (ZITHROMAX Z-PAK) 250 MG tablet Take 2 pills by mouth today and then 1 pill daily for 4 days 6 each 0  . Calcium Carbonate-Vitamin D (CALCIUM 600+D) 600-400 MG-UNIT per tablet Take 2 tablets by mouth daily.      . fish oil-omega-3 fatty acids 1000 MG capsule Take  1 capsule by mouth 2 (two) times daily.     . fluticasone (FLONASE) 50 MCG/ACT nasal spray Place 2 sprays into the nose daily. 16 g 6  . glipiZIDE (GLUCOTROL XL) 5 MG 24 hr tablet TAKE 2 TABLETS (10 MG TOTAL) BY MOUTH DAILY. 180 tablet 3  . glucose blood (FREESTYLE LITE) test strip TEST BLOOD SUGAR ONCE DAILY FOR DM 250.0 50 each 3  . Ibuprofen-Diphenhydramine Cit (ADVIL PM PO) Take by mouth as needed.     . Lancets 28G MISC by Does not apply route. Check sugar once daily and as needed for DM 250.0     . meclizine (ANTIVERT) 25 MG tablet Take 25 mg by mouth as needed.    . metFORMIN (GLUCOPHAGE) 500 MG tablet TAKE 2 TABLETS BY MOUTH EVERY MORNING, 1 TAB AT LUNCH AND 1 TAB AT SUPPER 360 tablet 1  . metoprolol tartrate (LOPRESSOR) 25 MG tablet TAKE 1 TABLET BY MOUTH TWICE A DAY 180 tablet 2  . metoprolol tartrate (LOPRESSOR) 25 MG tablet TAKE 1 TABLET BY MOUTH TWICE A DAY 60 tablet 6  . Multiple Vitamin (MULTIVITAMIN) tablet Take 1 tablet by mouth daily.      Marland Kitchen. omeprazole (PRILOSEC) 20 MG  capsule Take 20 mg by mouth daily.    . quinapril (ACCUPRIL) 40 MG tablet TAKE 1 TABLET AT BEDTIME 90 tablet 3   No current facility-administered medications on file prior to visit.    Allergies  Allergen Reactions  . Acetaminophen     REACTION: nausea  . Codeine     REACTION: itching  . Fluticasone Propionate     REACTION: palpitations  . Fosamax [Alendronate Sodium]     Difficulty swallowing and fever/ joint pain   . Naproxen     REACTION: GI  . Nsaids     REACTION: elevated LFT's  . Penicillins     REACTION: swelling at inj site    Past Medical History  Diagnosis Date  . Asthma     childhood  . Depression   . Scoliosis     chronic back pain  . Diabetes mellitus     Type II  . HLD (hyperlipidemia)   . HTN (hypertension)   . Osteoarthritis     hands,elbow,knees  . Rhinitis, allergic   . Foot fracture, right 3/13  . H/O scoliosis   . Osteoporosis     Past Surgical History    Procedure Laterality Date  . Tubal ligation      bilateral  . Bladder tack      History  Smoking status  . Never Smoker   Smokeless tobacco  . Not on file    History  Alcohol Use No    Family History  Problem Relation Age of Onset  . Coronary artery disease Mother   . Cancer Mother     thyroid  . Heart attack Mother   . Coronary artery disease Father   . Heart attack Father   . Multiple sclerosis Sister     Reviw of Systems:  Reviewed in the HPI.  All other systems are negative.  Physical Exam: Blood pressure 100/72, pulse 75, height 5\' 2"  (1.575 m), weight 94 lb 6.4 oz (42.82 kg), last menstrual period 04/22/1985. General: Well developed, well nourished, in no acute distress.  Head: Normocephalic, atraumatic, sclera non-icteric, mucus membranes are moist,   Neck: Supple. Carotids are 2 + without bruits. No JVD  Lungs: Clear bilaterally to auscultation.  Heart: regular rate.  normal  S1 S2. No murmurs, gallops or rubs.  Abdomen: Soft, non-tender, non-distended with normal bowel sounds. No hepatomegaly. No rebound/guarding. No masses.  Msk:  Strength and tone are normal  Extremities: No clubbing or cyanosis. No edema.  Distal pedal pulses are 2+ and equal bilaterally.  Neuro: Alert and oriented X 3. Moves all extremities spontaneously.  Gait is a bit slow.  She has a bad right knee.   She favors her right leg.    Psych:  Responds to questions appropriately with a normal affect.  ECG: 11/17/2012;    NSR at 89.  No ST or T wave changes  Assessment / Plan:

## 2014-03-25 NOTE — Patient Instructions (Signed)
A1c is good Cholesterol is up a bit -so watch diet carefully (Avoid red meat/ fried foods/ egg yolks/ fatty breakfast meats/ butter, cheese and high fat dairy/ and shellfish ) Don't forget to make your annual eye doctor appt   Follow up for annual exam in 6 months with labs prior

## 2014-03-25 NOTE — Assessment & Plan Note (Signed)
BP seems to be well controlled

## 2014-03-25 NOTE — Progress Notes (Signed)
Pre visit review using our clinic review tool, if applicable. No additional management support is needed unless otherwise documented below in the visit note. 

## 2014-03-25 NOTE — Progress Notes (Signed)
Subjective:    Patient ID: [name], female    DOB: 10-Feb-1936, 78 y.o.   MRN: [id number]  HPI Here for f/u of chronic medical problems   Has been feeling pretty well  Her appetite is not as good as it used to be  Wt is down 2 lb with bmi of 26 Mood is fine   Antidepressant- stays   bp is stable today  No cp or palpitations or headaches or edema  No side effects to medicines  BP Readings from Last 3 Encounters:  03/25/14 128/64  06/22/13 114/60  05/17/13 127/75     Diabetes Home sugar results  DM diet - improved  Exercise - is generally active  Symptoms-none  A1C last  Lab Results  Component Value Date   HGBA1C 6.3 03/21/2014   Down from 6.4  No problems with medications  Renal protection ace  Last eye exam -is overdue and she will make her own appt (small cataracts now ready to come out)   Lipids are up a bit  Declines med Lab Results  Component Value Date   CHOL 195 03/21/2014   CHOL 177 06/18/2013   CHOL 174 01/20/2012   Lab Results  Component Value Date   HDL 36.80* 03/21/2014   HDL 40.50 06/18/2013   HDL 36.50* 01/20/2012   Lab Results  Component Value Date   LDLCALC 135* 03/21/2014   LDLCALC 117* 06/18/2013   LDLCALC 109* 01/20/2012   Lab Results  Component Value Date   TRIG 117.0 03/21/2014   TRIG 100.0 06/18/2013   TRIG 145.0 01/20/2012   Lab Results  Component Value Date   CHOLHDL 5 03/21/2014   CHOLHDL 4 06/18/2013   CHOLHDL 5 01/20/2012   No results found for: LDLDIRECT   Declines med of any kind  Is very careful with diet -no greasy food or red meat   Patient Active Problem List   Diagnosis Date Noted  . Encounter for Medicare annual wellness exam 06/22/2013  . Colon cancer screening 06/22/2013  . Acute bronchitis with bronchospasm 06/22/2013  . Osteoporosis 03/02/2012  . Other screening mammogram 01/31/2012  . Post-menopausal 01/31/2012  . Vertigo 01/31/2012  . Tachycardia 09/11/2011  . Scoliosis   . ARM PAIN,  RIGHT 03/29/2010  . ALLERGIC RHINITIS 08/12/2007  . ADVEF, DRUG/MEDICINAL/BIOLOGICAL SUBST NOS 08/20/2006  . HERPES SIMPLEX INFECTION 07/28/2006  . LYME DISEASE 07/28/2006  . Diabetes type 2, controlled 07/28/2006  . Hyperlipidemia 07/28/2006  . DEPRESSION 07/28/2006  . Essential hypertension 07/28/2006  . ASTHMA 07/28/2006  . OVERACTIVE BLADDER 07/28/2006  . OSTEOARTHRITIS 07/28/2006  . DEGENERATIVE DISC DISEASE 07/28/2006  . FIBROSITIS 07/28/2006  . SCOLIOSIS 07/28/2006  . LIVER FUNCTION TESTS, ABNORMAL 07/28/2006   Past Medical History  Diagnosis Date  . Asthma     childhood  . Depression   . Scoliosis     chronic back pain  . Diabetes mellitus     Type II  . HLD (hyperlipidemia)   . HTN (hypertension)   . Osteoarthritis     hands,elbow,knees  . Rhinitis, allergic   . Foot fracture, right 3/13  . H/O scoliosis   . Osteoporosis    Past Surgical History  Procedure Laterality Date  . Tubal ligation      bilateral  . Bladder tack     History  Substance Use Topics  . Smoking status: Never Smoker   . Smokeless tobacco: Not on file  . Alcohol Use: No   Family History  Problem Relation Age of Onset  . Coronary artery disease Mother   . Cancer Mother     thyroid  . Heart attack Mother   . Coronary artery disease Father   . Heart attack Father   . Multiple sclerosis Sister    Allergies  Allergen Reactions  . Acetaminophen     REACTION: nausea  . Codeine     REACTION: itching  . Fluticasone Propionate     REACTION: palpitations  . Fosamax [Alendronate Sodium]     Difficulty swallowing and fever/ joint pain   . Naproxen     REACTION: GI  . Nsaids     REACTION: elevated LFT's  . Penicillins     REACTION: swelling at inj site   Current Outpatient Prescriptions on File Prior to Visit  Medication Sig Dispense Refill  . albuterol (PROVENTIL HFA;VENTOLIN HFA) 108 (90 BASE) MCG/ACT inhaler Inhale 2 puffs into the lungs every 4 (four) hours as needed for  wheezing or shortness of breath. 1 Inhaler 1  . albuterol (PROVENTIL HFA;VENTOLIN HFA) 108 (90 BASE) MCG/ACT inhaler Inhale 2 puffs into the lungs every 4 (four) hours as needed for wheezing. 1 Inhaler 3  . amitriptyline (ELAVIL) 25 MG tablet TAKE 1 TABLET BY MOUTH AT BEDTIME 30 tablet 11  . amLODipine (NORVASC) 10 MG tablet TAKE 1 TABLET BY MOUTH DAILY 90 tablet 3  . aspirin 81 MG tablet Take 81 mg by mouth daily.      Marland Kitchen. azithromycin (ZITHROMAX Z-PAK) 250 MG tablet Take 2 pills by mouth today and then 1 pill daily for 4 days 6 each 0  . Calcium Carbonate-Vitamin D (CALCIUM 600+D) 600-400 MG-UNIT per tablet Take 2 tablets by mouth daily.      . fish oil-omega-3 fatty acids 1000 MG capsule Take 1 capsule by mouth 2 (two) times daily.     . fluticasone (FLONASE) 50 MCG/ACT nasal spray Place 2 sprays into the nose daily. 16 g 6  . glipiZIDE (GLUCOTROL XL) 5 MG 24 hr tablet TAKE 2 TABLETS (10 MG TOTAL) BY MOUTH DAILY. 180 tablet 3  . glucose blood (FREESTYLE LITE) test strip TEST BLOOD SUGAR ONCE DAILY FOR DM 250.0 50 each 3  . Ibuprofen-Diphenhydramine Cit (ADVIL PM PO) Take by mouth as needed.     . Lancets 28G MISC by Does not apply route. Check sugar once daily and as needed for DM 250.0     . meclizine (ANTIVERT) 25 MG tablet Take 25 mg by mouth as needed.    . metFORMIN (GLUCOPHAGE) 500 MG tablet TAKE 2 TABLETS BY MOUTH EVERY MORNING, 1 TAB AT LUNCH AND 1 TAB AT SUPPER 360 tablet 1  . metoprolol tartrate (LOPRESSOR) 25 MG tablet TAKE 1 TABLET BY MOUTH TWICE A DAY 180 tablet 2  . metoprolol tartrate (LOPRESSOR) 25 MG tablet TAKE 1 TABLET BY MOUTH TWICE A DAY 60 tablet 6  . Multiple Vitamin (MULTIVITAMIN) tablet Take 1 tablet by mouth daily.      Marland Kitchen. omeprazole (PRILOSEC) 20 MG capsule Take 20 mg by mouth daily.    . quinapril (ACCUPRIL) 40 MG tablet TAKE 1 TABLET AT BEDTIME 90 tablet 3   No current facility-administered medications on file prior to visit.     Review of Systems Review of  Systems  Constitutional: Negative for fever, appetite change, fatigue and unexpected weight change.  Eyes: Negative for pain and visual disturbance.  Respiratory: Negative for cough and shortness of breath.   Cardiovascular: Negative for cp or  palpitations    Gastrointestinal: Negative for nausea, diarrhea and constipation.  Genitourinary: Negative for urgency and frequency.  Skin: Negative for pallor or rash   Neurological: Negative for weakness, light-headedness, numbness and headaches.  Hematological: Negative for adenopathy. Does not bruise/bleed easily.  Psychiatric/Behavioral: Negative for dysphoric mood. The patient is not nervous/anxious.         Objective:   Physical Exam  Constitutional: She appears well-developed and well-nourished. No distress.  HENT:  Head: Normocephalic and atraumatic.  Mouth/Throat: Oropharynx is clear and moist.  Eyes: Conjunctivae and EOM are normal. Pupils are equal, round, and reactive to light. Right eye exhibits no discharge. Left eye exhibits no discharge.  Neck: Normal range of motion. Neck supple. No JVD present. No thyromegaly present.  Cardiovascular: Normal rate, regular rhythm, normal heart sounds and intact distal pulses.   Pulmonary/Chest: Effort normal and breath sounds normal. No respiratory distress. She has no wheezes. She has no rales.  Abdominal: Soft. Bowel sounds are normal.  Musculoskeletal: She exhibits no edema.  Lymphadenopathy:    She has no cervical adenopathy.  Neurological: She is alert. She has normal reflexes. No cranial nerve deficit. She exhibits normal muscle tone. Coordination normal.  Skin: Skin is warm and dry. No rash noted. No erythema. No pallor.  Psychiatric: She has a normal mood and affect.          Assessment & Plan:   Problem List Items Addressed This Visit      Cardiovascular and Mediastinum   Essential hypertension - Primary    bp in fair control at this time  BP Readings from Last 1  Encounters:  03/25/14 140/72   No changes needed Disc lifstyle change with low sodium diet and exercise   Labs reviewed       Endocrine   Diabetes type 2, controlled    Lab Results  Component Value Date   HGBA1C 6.3 03/21/2014   Overall stable and well controlled Enc good diet and exercise       Other   Hyperlipidemia    Disc goals for lipids and reasons to control them Rev labs with pt Rev low sat fat diet in detail This is up and pt declines med of any kind  Will watch diet and continue to follow

## 2014-03-25 NOTE — Patient Instructions (Signed)
Please start atorvastatin 20 mg once daily  Your physician recommends that you return for lab work in: 3 months:  BMET, lipid profile, liver profile  Your physician wants you to follow-up in: 1 year.  You will receive a reminder letter in the mail two months in advance.  If you don't receive a letter, please call our office to schedule the follow-up appointment.

## 2014-03-27 NOTE — Assessment & Plan Note (Signed)
bp in fair control at this time  BP Readings from Last 1 Encounters:  03/25/14 140/72   No changes needed Disc lifstyle change with low sodium diet and exercise   Labs reviewed

## 2014-03-27 NOTE — Assessment & Plan Note (Signed)
Lab Results  Component Value Date   HGBA1C 6.3 03/21/2014   Overall stable and well controlled Enc good diet and exercise

## 2014-03-27 NOTE — Assessment & Plan Note (Signed)
Disc goals for lipids and reasons to control them Rev labs with pt Rev low sat fat diet in detail This is up and pt declines med of any kind  Will watch diet and continue to follow

## 2014-04-27 ENCOUNTER — Ambulatory Visit: Payer: BLUE CROSS/BLUE SHIELD | Admitting: Cardiovascular Disease

## 2014-06-24 ENCOUNTER — Other Ambulatory Visit (INDEPENDENT_AMBULATORY_CARE_PROVIDER_SITE_OTHER): Payer: Medicare Other

## 2014-06-24 DIAGNOSIS — R000 Tachycardia, unspecified: Secondary | ICD-10-CM | POA: Diagnosis not present

## 2014-07-09 ENCOUNTER — Other Ambulatory Visit: Payer: Self-pay | Admitting: Family Medicine

## 2014-07-27 ENCOUNTER — Other Ambulatory Visit: Payer: Self-pay | Admitting: Cardiovascular Disease

## 2014-09-15 ENCOUNTER — Other Ambulatory Visit: Payer: Self-pay | Admitting: Family Medicine

## 2014-09-15 ENCOUNTER — Telehealth: Payer: Self-pay | Admitting: Family Medicine

## 2014-09-15 DIAGNOSIS — E119 Type 2 diabetes mellitus without complications: Secondary | ICD-10-CM

## 2014-09-15 DIAGNOSIS — M810 Age-related osteoporosis without current pathological fracture: Secondary | ICD-10-CM | POA: Insufficient documentation

## 2014-09-15 DIAGNOSIS — I10 Essential (primary) hypertension: Secondary | ICD-10-CM

## 2014-09-15 DIAGNOSIS — E785 Hyperlipidemia, unspecified: Secondary | ICD-10-CM

## 2014-09-15 NOTE — Telephone Encounter (Signed)
-----   Message from Baldomero LamyNatasha C Chavers sent at 09/15/2014  9:15 AM EDT ----- Regarding: cpx labs Tues 5/31, need orders please:-) Please order  future cpx labs for pt's upcoming lab appt. Thanks Rodney Boozeasha

## 2014-09-20 ENCOUNTER — Other Ambulatory Visit (INDEPENDENT_AMBULATORY_CARE_PROVIDER_SITE_OTHER): Payer: Medicare Other

## 2014-09-20 DIAGNOSIS — E119 Type 2 diabetes mellitus without complications: Secondary | ICD-10-CM

## 2014-09-20 DIAGNOSIS — I10 Essential (primary) hypertension: Secondary | ICD-10-CM | POA: Diagnosis not present

## 2014-09-20 DIAGNOSIS — E785 Hyperlipidemia, unspecified: Secondary | ICD-10-CM

## 2014-09-20 DIAGNOSIS — M810 Age-related osteoporosis without current pathological fracture: Secondary | ICD-10-CM | POA: Diagnosis not present

## 2014-09-20 LAB — CBC WITH DIFFERENTIAL/PLATELET
BASOS ABS: 0 10*3/uL (ref 0.0–0.1)
Basophils Relative: 0.7 % (ref 0.0–3.0)
Eosinophils Absolute: 0.1 10*3/uL (ref 0.0–0.7)
Eosinophils Relative: 1.1 % (ref 0.0–5.0)
HEMATOCRIT: 32.4 % — AB (ref 36.0–46.0)
Hemoglobin: 10.9 g/dL — ABNORMAL LOW (ref 12.0–15.0)
LYMPHS PCT: 29.7 % (ref 12.0–46.0)
Lymphs Abs: 2.1 10*3/uL (ref 0.7–4.0)
MCHC: 33.7 g/dL (ref 30.0–36.0)
MCV: 87.8 fl (ref 78.0–100.0)
MONOS PCT: 6 % (ref 3.0–12.0)
Monocytes Absolute: 0.4 10*3/uL (ref 0.1–1.0)
NEUTROS ABS: 4.5 10*3/uL (ref 1.4–7.7)
Neutrophils Relative %: 62.5 % (ref 43.0–77.0)
Platelets: 232 10*3/uL (ref 150.0–400.0)
RBC: 3.69 Mil/uL — ABNORMAL LOW (ref 3.87–5.11)
RDW: 13.9 % (ref 11.5–15.5)
WBC: 7.2 10*3/uL (ref 4.0–10.5)

## 2014-09-20 LAB — COMPREHENSIVE METABOLIC PANEL
ALT: 15 U/L (ref 0–35)
AST: 22 U/L (ref 0–37)
Albumin: 4.1 g/dL (ref 3.5–5.2)
Alkaline Phosphatase: 41 U/L (ref 39–117)
BUN: 15 mg/dL (ref 6–23)
CHLORIDE: 104 meq/L (ref 96–112)
CO2: 26 meq/L (ref 19–32)
CREATININE: 1.13 mg/dL (ref 0.40–1.20)
Calcium: 9.8 mg/dL (ref 8.4–10.5)
GFR: 49.64 mL/min — AB (ref >60.00)
GLUCOSE: 127 mg/dL — AB (ref 70–99)
Potassium: 4.7 mEq/L (ref 3.5–5.1)
Sodium: 137 mEq/L (ref 135–145)
Total Bilirubin: 0.5 mg/dL (ref 0.2–1.2)
Total Protein: 7.1 g/dL (ref 6.0–8.3)

## 2014-09-20 LAB — LIPID PANEL
CHOL/HDL RATIO: 4
Cholesterol: 172 mg/dL (ref 0–200)
HDL: 43.4 mg/dL (ref >39.00)
LDL Cholesterol: 106 mg/dL — ABNORMAL HIGH (ref 0–99)
NonHDL: 128.6
Triglycerides: 114 mg/dL (ref 0.0–149.0)
VLDL: 22.8 mg/dL (ref 0.0–40.0)

## 2014-09-20 LAB — TSH: TSH: 1.12 u[IU]/mL (ref 0.35–4.50)

## 2014-09-20 LAB — HEMOGLOBIN A1C: HEMOGLOBIN A1C: 6.4 % (ref 4.6–6.5)

## 2014-09-20 LAB — VITAMIN D 25 HYDROXY (VIT D DEFICIENCY, FRACTURES): VITD: 35.58 ng/mL (ref 30.00–100.00)

## 2014-09-23 ENCOUNTER — Other Ambulatory Visit: Payer: Self-pay | Admitting: *Deleted

## 2014-09-23 ENCOUNTER — Other Ambulatory Visit: Payer: Self-pay

## 2014-09-23 MED ORDER — METOPROLOL TARTRATE 25 MG PO TABS: 25.0000 mg | ORAL_TABLET | Freq: Two times a day (BID) | ORAL | Status: DC

## 2014-09-23 MED ORDER — QUINAPRIL HCL 40 MG PO TABS: 40.0000 mg | ORAL_TABLET | Freq: Every day | ORAL | Status: DC

## 2014-09-23 NOTE — Telephone Encounter (Signed)
Received fax requesting Rx be sent to [facility], done

## 2014-09-26 ENCOUNTER — Encounter: Payer: BLUE CROSS/BLUE SHIELD | Admitting: Family Medicine

## 2014-09-28 ENCOUNTER — Encounter: Payer: Self-pay | Admitting: Family Medicine

## 2014-09-28 ENCOUNTER — Ambulatory Visit (INDEPENDENT_AMBULATORY_CARE_PROVIDER_SITE_OTHER): Payer: Medicare Other | Admitting: Family Medicine

## 2014-09-28 VITALS — BP 108/56 | HR 72 | Temp 98.2°F | Ht 60.25 in | Wt 136.5 lb

## 2014-09-28 DIAGNOSIS — M810 Age-related osteoporosis without current pathological fracture: Secondary | ICD-10-CM | POA: Diagnosis not present

## 2014-09-28 DIAGNOSIS — Z0000 Encounter for general adult medical examination without abnormal findings: Secondary | ICD-10-CM

## 2014-09-28 DIAGNOSIS — Z23 Encounter for immunization: Secondary | ICD-10-CM | POA: Diagnosis not present

## 2014-09-28 DIAGNOSIS — D649 Anemia, unspecified: Secondary | ICD-10-CM | POA: Insufficient documentation

## 2014-09-28 DIAGNOSIS — Z1211 Encounter for screening for malignant neoplasm of colon: Secondary | ICD-10-CM

## 2014-09-28 DIAGNOSIS — E785 Hyperlipidemia, unspecified: Secondary | ICD-10-CM | POA: Diagnosis not present

## 2014-09-28 DIAGNOSIS — I10 Essential (primary) hypertension: Secondary | ICD-10-CM

## 2014-09-28 DIAGNOSIS — Z79899 Other long term (current) drug therapy: Secondary | ICD-10-CM

## 2014-09-28 DIAGNOSIS — E119 Type 2 diabetes mellitus without complications: Secondary | ICD-10-CM | POA: Diagnosis not present

## 2014-09-28 NOTE — Progress Notes (Signed)
Pre visit review using our clinic review tool, if applicable. No additional management support is needed unless otherwise documented below in the visit note. 

## 2014-09-28 NOTE — Patient Instructions (Signed)
You have new anemia - please do stool cards and we will also do more labs today (not sure where it is from but I think it is making you feel bad) Take meclizine for dizziness as needed  Your blood pressure is low - hold your amlodipine (do not take it for now)  prevnar vaccine today (pneumonia booster) Make sure to drink enough water Avoid ibuprofen or any anti inflammatories for now Diabetes is stable   Go for your yearly eye exam as well

## 2014-09-28 NOTE — Progress Notes (Signed)
Subjective:    Patient ID: [name], female    DOB: 27-Jun-1935, 79 y.o.   MRN: [id number]  HPI Here for annual medicare wellness visit as well as chronic/acute medical problems  And also preventative examination   I have personally reviewed the Medicare Annual Wellness questionnaire and have noted 1. The patient's medical and social history 2. Their use of alcohol, tobacco or illicit drugs 3. Their current medications and supplements 4. The patient's functional ability including ADL's, fall risks, home safety risks and hearing or visual             impairment. 5. Diet and physical activities 6. Evidence for depression or mood disorders  The patients weight, height, BMI have been recorded in the chart and visual acuity is per eye clinic.  I have made referrals, counseling and provided education to the patient based review of the above and I have provided the pt with a written personalized care plan for preventive services. Reviewed and updated provider list, see scanned forms.  See scanned forms.  Routine anticipatory guidance given to patient.  See health maintenance. Colon cancer screening- declines colonoscopy, has done stool card  Breast cancer screening declines mammogram  Self breast exam-no lumps  Flu vaccine -had it in the fall  Tetanus vaccine 10/13 Pneumovax had pneumovax 23 in 07, has not had the prevnar  Zoster vaccine 4/13  dexa 10/13 - OP - and did not tolerate fosamax , is taking ca and D and her D level is 35  Advance directive has a living will and POA Cognitive function addressed- see scanned forms- and if abnormal then additional documentation follows.  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   ROS: See HPI.  Otherwise negative.     Has been dizzy lately (and generally tired) Worse the past several days  Thinks it is vertigo (has meclizine)    Worse to change position or roll over in bed / moving head around   Some discomfort across back    New anemia Lab Results  Component Value Date   WBC 7.2 09/20/2014   HGB 10.9* 09/20/2014   HCT 32.4* 09/20/2014   MCV 87.8 09/20/2014   PLT 232.0 09/20/2014   no GI symptoms  Is very tired and worn out  No blood in stool  No dark or black stool (is a bit looser) Very rarely takes nsaid - occ ibuprofen but not often / no asa   bp is down today from usual  No cp or palpitations or headaches or edema  No side effects to medicines  BP Readings from Last 3 Encounters:  09/28/14 108/56  03/25/14 140/72  03/25/14 128/64      DM Lab Results  Component Value Date   HGBA1C 6.4 09/20/2014    Not eating enough - she has no appetite  ? What is taking appetite   Does not feel depressed but does not sleep well  Takes amitriptyline     Chemistry      Component Value Date/Time   NA 137 09/20/2014 1011   K 4.7 09/20/2014 1011   CL 104 09/20/2014 1011   CO2 26 09/20/2014 1011   BUN 15 09/20/2014 1011   CREATININE 1.13 09/20/2014 1011   CREATININE 0.81 01/11/2011 1406      Component Value Date/Time   CALCIUM 9.8 09/20/2014 1011  ALKPHOS 41 09/20/2014 1011   AST 22 09/20/2014 1011   ALT 15 09/20/2014 1011   BILITOT 0.5 09/20/2014 1011      Lab Results  Component Value Date   TSH 1.12 09/20/2014     Cholesterol Lab Results  Component Value Date   CHOL 172 09/20/2014   CHOL 195 03/21/2014   CHOL 177 06/18/2013   Lab Results  Component Value Date   HDL 43.40 09/20/2014   HDL 36.80* 03/21/2014   HDL 40.50 06/18/2013   Lab Results  Component Value Date   LDLCALC 106* 09/20/2014   LDLCALC 135* 03/21/2014   LDLCALC 117* 06/18/2013   Lab Results  Component Value Date   TRIG 114.0 09/20/2014   TRIG 117.0 03/21/2014   TRIG 100.0 06/18/2013   Lab Results  Component Value Date   CHOLHDL 4 09/20/2014   CHOLHDL 5 03/21/2014   CHOLHDL 4 06/18/2013   No  results found for: LDLDIRECT  Cholesterol is down   Patient Active Problem List   Diagnosis Date Noted  . Routine general medical examination at a health care facility 09/28/2014  . Anemia 09/28/2014  . Postmenopausal bone loss 09/15/2014  . Encounter for Medicare annual wellness exam 06/22/2013  . Colon cancer screening 06/22/2013  . Acute bronchitis with bronchospasm 06/22/2013  . Osteoporosis 03/02/2012  . Other screening mammogram 01/31/2012  . Post-menopausal 01/31/2012  . Vertigo 01/31/2012  . Tachycardia 09/11/2011  . Scoliosis   . ARM PAIN, RIGHT 03/29/2010  . ALLERGIC RHINITIS 08/12/2007  . ADVEF, DRUG/MEDICINAL/BIOLOGICAL SUBST NOS 08/20/2006  . HERPES SIMPLEX INFECTION 07/28/2006  . LYME DISEASE 07/28/2006  . Diabetes type 2, controlled 07/28/2006  . Hyperlipidemia 07/28/2006  . DEPRESSION 07/28/2006  . Essential hypertension 07/28/2006  . ASTHMA 07/28/2006  . OVERACTIVE BLADDER 07/28/2006  . OSTEOARTHRITIS 07/28/2006  . DEGENERATIVE DISC DISEASE 07/28/2006  . FIBROSITIS 07/28/2006  . SCOLIOSIS 07/28/2006  . LIVER FUNCTION TESTS, ABNORMAL 07/28/2006   Past Medical History  Diagnosis Date  . Asthma     childhood  . Depression   . Scoliosis     chronic back pain  . Diabetes mellitus     Type II  . HLD (hyperlipidemia)   . HTN (hypertension)   . Osteoarthritis     hands,elbow,knees  . Rhinitis, allergic   . Foot fracture, right 3/13  . H/O scoliosis   . Osteoporosis    Past Surgical History  Procedure Laterality Date  . Tubal ligation      bilateral  . Bladder tack     History  Substance Use Topics  . Smoking status: Never Smoker   . Smokeless tobacco: Not on file  . Alcohol Use: No   Family History  Problem Relation Age of Onset  . Coronary artery disease Mother   . Cancer Mother     thyroid  . Heart attack Mother   . Coronary artery disease Father   . Heart attack Father   . Multiple sclerosis Sister    Allergies  Allergen Reactions   . Acetaminophen     REACTION: nausea  . Codeine     REACTION: itching  . Fluticasone Propionate     REACTION: palpitations  . Fosamax [Alendronate Sodium]     Difficulty swallowing and fever/ joint pain   . Naproxen     REACTION: GI  . Nsaids     REACTION: elevated LFT's  . Penicillins     REACTION: swelling at inj site   Current Outpatient Prescriptions on  File Prior to Visit  Medication Sig Dispense Refill  . amitriptyline (ELAVIL) 25 MG tablet TAKE 1 TABLET BY MOUTH AT BEDTIME 30 tablet 11  . amLODipine (NORVASC) 10 MG tablet TAKE 1 TABLET BY MOUTH DAILY 90 tablet 3  . aspirin 81 MG tablet Take 81 mg by mouth daily.      Marland Kitchen atorvastatin (LIPITOR) 40 MG tablet Take 1 tablet (40 mg total) by mouth daily. 90 tablet 3  . Calcium Carbonate-Vitamin D (CALCIUM 600+D) 600-400 MG-UNIT per tablet Take 2 tablets by mouth daily.      . fish oil-omega-3 fatty acids 1000 MG capsule Take 1 capsule by mouth 2 (two) times daily.     . fluticasone (FLONASE) 50 MCG/ACT nasal spray Place 2 sprays into the nose daily. 16 g 6  . glipiZIDE (GLUCOTROL XL) 5 MG 24 hr tablet TAKE 2 TABLETS (10 MG TOTAL) BY MOUTH DAILY. 180 tablet 0  . glucose blood (FREESTYLE LITE) test strip TEST BLOOD SUGAR ONCE DAILY FOR DM 250.0 50 each 3  . Ibuprofen-Diphenhydramine Cit (ADVIL PM PO) Take by mouth as needed.     . Lancets 28G MISC by Does not apply route. Check sugar once daily and as needed for DM 250.0     . meclizine (ANTIVERT) 25 MG tablet Take 25 mg by mouth as needed.    . metFORMIN (GLUCOPHAGE) 500 MG tablet TAKE 2 TABLETS BY MOUTH EVERY MORNING, 1 TAB AT LUNCH AND 1 TAB AT SUPPER 360 tablet 0  . metoprolol tartrate (LOPRESSOR) 25 MG tablet Take 1 tablet (25 mg total) by mouth 2 (two) times daily. 180 tablet 1  . Multiple Vitamin (MULTIVITAMIN) tablet Take 1 tablet by mouth daily.      Marland Kitchen omeprazole (PRILOSEC) 20 MG capsule Take 20 mg by mouth daily.    . quinapril (ACCUPRIL) 40 MG tablet Take 1 tablet (40 mg  total) by mouth at bedtime. 90 tablet 2  . albuterol (PROVENTIL HFA;VENTOLIN HFA) 108 (90 BASE) MCG/ACT inhaler Inhale 2 puffs into the lungs every 4 (four) hours as needed for wheezing. (Patient not taking: Reported on 09/28/2014) 1 Inhaler 3   No current facility-administered medications on file prior to visit.    Review of Systems    Review of Systems  Constitutional: Negative for fever, appetite change, and unexpected weight change. pos for gen fatigue and malaise Eyes: Negative for pain and visual disturbance.  Respiratory: Negative for cough and shortness of breath.   Cardiovascular: Negative for cp or palpitations    Gastrointestinal: Negative for nausea, diarrhea and constipation. neg for blood in stool, abd pain or dark stool  Genitourinary: Negative for urgency and frequency.  Skin: Negative for pallor or rash   Neurological: Negative for weakness, light-headedness, numbness and headaches.  Hematological: Negative for adenopathy. Does not bruise/bleed easily.  Psychiatric/Behavioral: Negative for dysphoric mood. The patient is not nervous/anxious.      Objective:   Physical Exam  Constitutional: She appears well-developed and well-nourished. No distress.  obese and well appearing (though seems fatigued)  HENT:  Head: Normocephalic and atraumatic.  Right Ear: External ear normal.  Left Ear: External ear normal.  Mouth/Throat: Oropharynx is clear and moist.  Eyes: Conjunctivae and EOM are normal. Pupils are equal, round, and reactive to light. No scleral icterus.  No conj pallor  Neck: Normal range of motion. Neck supple. No JVD present. Carotid bruit is not present. No thyromegaly present.  Cardiovascular: Normal rate, regular rhythm, normal heart sounds and intact  distal pulses.  Exam reveals no gallop.   Pulmonary/Chest: Effort normal and breath sounds normal. No respiratory distress. She has no wheezes. She exhibits no tenderness.  Abdominal: Soft. Bowel sounds are normal.  She exhibits no distension, no abdominal bruit and no mass. There is no tenderness.  Genitourinary: No breast swelling, tenderness, discharge or bleeding.  Breast exam: No mass, nodules, thickening, tenderness, bulging, retraction, inflamation, nipple discharge or skin changes noted.  No axillary or clavicular LA.      Musculoskeletal: Normal range of motion. She exhibits no edema or tenderness.  Lymphadenopathy:    She has no cervical adenopathy.  Neurological: She is alert. She has normal reflexes. No cranial nerve deficit. She exhibits normal muscle tone. Coordination normal.  Skin: Skin is warm and dry. No rash noted. No erythema. There is pallor.  Mildly sallow complexion   Psychiatric: She has a normal mood and affect.          Assessment & Plan:   Problem List Items Addressed This Visit    Anemia    New anemia Pt feels tired with some dizziness  Heme stool cards and iron studies today  Will follow closely-likely needs GI eval  Update if symptoms worsen while waiting for results       Relevant Orders   Ferritin (Completed)   IBC Panel (Completed)   Vitamin B12 (Completed)   Reticulocytes (Completed)   Hemoccult Cards (X3 cards)   Colon cancer screening    Pt has new anemia -worrisome  Declines colonoscopy  Given 3 heme stool cards - also iron studies today May need GI eval/referral        Diabetes type 2, controlled    Stable  Lab Results  Component Value Date   HGBA1C 6.4 09/20/2014   Due for opth exam-pt will schedule that       Encounter for Medicare annual wellness exam    Reviewed health habits including diet and exercise and skin cancer prevention Reviewed appropriate screening tests for age  Also reviewed health mt list, fam hx and immunization status , as well as social and family history   See HPI Labs reviewed  prevnar vaccine today Pt will schedule opthy exam       Essential hypertension - Primary    bp is down from baseline  BP Readings  from Last 1 Encounters:  09/28/14 108/56    Disc lifstyle change with low sodium diet and exercise  Labs reviewed  Will hold amlodipine due to dizziness and dec bp for now      Hyperlipidemia    Lipids are improved  Disc goals for lipids and reasons to control them Rev labs with pt Rev low sat fat diet in detail        Osteoporosis    Disc need for calcium/ vitamin D/ wt bearing exercise and bone density test every 2 y to monitor Disc safety/ fracture risk in detail  No falls or fractures  dexa 10/13  Pt not ready to schedule another one yet       Routine general medical examination at a health care facility    Reviewed health habits including diet and exercise and skin cancer prevention Reviewed appropriate screening tests for age  Also reviewed health mt list, fam hx and immunization status , as well as social and family history   See HPI Labs reviewed  prevnar vaccine today Pt will schedule opthy exam        Other Visit Diagnoses  Need for vaccination with 13-polyvalent pneumococcal conjugate vaccine        Relevant Orders    Pneumococcal conjugate vaccine 13-valent (Completed)

## 2014-09-29 DIAGNOSIS — Z23 Encounter for immunization: Secondary | ICD-10-CM

## 2014-09-29 DIAGNOSIS — Z0000 Encounter for general adult medical examination without abnormal findings: Secondary | ICD-10-CM | POA: Diagnosis not present

## 2014-09-29 DIAGNOSIS — E119 Type 2 diabetes mellitus without complications: Secondary | ICD-10-CM | POA: Diagnosis not present

## 2014-09-29 LAB — RETICULOCYTES
ABS Retic: 33.6 10*3/uL (ref 19.0–186.0)
RBC.: 3.73 MIL/uL — ABNORMAL LOW (ref 3.87–5.11)
RETIC CT PCT: 0.9 % (ref 0.4–2.3)

## 2014-09-29 LAB — IBC PANEL
Iron: 43 ug/dL (ref 42–145)
Saturation Ratios: 9 % — ABNORMAL LOW (ref 20.0–50.0)
Transferrin: 341 mg/dL (ref 212.0–360.0)

## 2014-09-29 LAB — VITAMIN B12: Vitamin B-12: 179 pg/mL — ABNORMAL LOW (ref 211–911)

## 2014-09-29 LAB — FERRITIN: Ferritin: 10.2 ng/mL (ref 10.0–291.0)

## 2014-09-29 NOTE — Assessment & Plan Note (Signed)
Stable  Lab Results  Component Value Date   HGBA1C 6.4 09/20/2014   Due for opth exam-pt will schedule that

## 2014-09-29 NOTE — Assessment & Plan Note (Signed)
Disc need for calcium/ vitamin D/ wt bearing exercise and bone density test every 2 y to monitor Disc safety/ fracture risk in detail  No falls or fractures  dexa 10/13  Pt not ready to schedule another one yet

## 2014-09-29 NOTE — Assessment & Plan Note (Signed)
Pt has new anemia -worrisome  Declines colonoscopy  Given 3 heme stool cards - also iron studies today May need GI eval/referral

## 2014-09-29 NOTE — Assessment & Plan Note (Signed)
Lipids are improved Disc goals for lipids and reasons to control them Rev labs with pt Rev low sat fat diet in detail  

## 2014-09-29 NOTE — Assessment & Plan Note (Signed)
New anemia Pt feels tired with some dizziness  Heme stool cards and iron studies today  Will follow closely-likely needs GI eval  Update if symptoms worsen while waiting for results

## 2014-09-29 NOTE — Assessment & Plan Note (Signed)
Reviewed health habits including diet and exercise and skin cancer prevention Reviewed appropriate screening tests for age  Also reviewed health mt list, fam hx and immunization status , as well as social and family history   See HPI Labs reviewed  prevnar vaccine today Pt will schedule opthy exam

## 2014-09-29 NOTE — Assessment & Plan Note (Signed)
Reviewed health habits including diet and exercise and skin cancer prevention Reviewed appropriate screening tests for age  Also reviewed health mt list, fam hx and immunization status , as well as social and family history   See HPI Labs reviewed  prevnar vaccine today Pt will schedule opthy exam  

## 2014-09-29 NOTE — Assessment & Plan Note (Signed)
bp is down from baseline  BP Readings from Last 1 Encounters:  09/28/14 108/56    Disc lifstyle change with low sodium diet and exercise  Labs reviewed  Will hold amlodipine due to dizziness and dec bp for now

## 2014-10-04 ENCOUNTER — Ambulatory Visit (INDEPENDENT_AMBULATORY_CARE_PROVIDER_SITE_OTHER): Payer: Medicare Other | Admitting: *Deleted

## 2014-10-04 DIAGNOSIS — E538 Deficiency of other specified B group vitamins: Secondary | ICD-10-CM

## 2014-10-04 MED ORDER — CYANOCOBALAMIN 1000 MCG/ML IJ SOLN
1000.0000 ug | Freq: Once | INTRAMUSCULAR | Status: AC
  Administered 2014-10-04: 1000 ug via INTRAMUSCULAR

## 2014-10-05 ENCOUNTER — Other Ambulatory Visit: Payer: Medicare Other

## 2014-10-05 DIAGNOSIS — D649 Anemia, unspecified: Secondary | ICD-10-CM

## 2014-10-05 LAB — HEMOCCULT SLIDES (X 3 CARDS)
FECAL OCCULT BLD: NEGATIVE
OCCULT 1: NEGATIVE
OCCULT 2: NEGATIVE
OCCULT 3: NEGATIVE
OCCULT 4: NEGATIVE
OCCULT 5: NEGATIVE

## 2014-10-12 ENCOUNTER — Ambulatory Visit (INDEPENDENT_AMBULATORY_CARE_PROVIDER_SITE_OTHER): Payer: Medicare Other

## 2014-10-12 DIAGNOSIS — E538 Deficiency of other specified B group vitamins: Secondary | ICD-10-CM | POA: Diagnosis not present

## 2014-10-12 MED ORDER — CYANOCOBALAMIN 1000 MCG/ML IJ SOLN
1000.0000 ug | Freq: Once | INTRAMUSCULAR | Status: AC
  Administered 2014-10-12: 1000 ug via INTRAMUSCULAR

## 2014-10-13 ENCOUNTER — Encounter: Payer: Self-pay | Admitting: Primary Care

## 2014-10-13 ENCOUNTER — Ambulatory Visit (INDEPENDENT_AMBULATORY_CARE_PROVIDER_SITE_OTHER): Payer: Medicare Other | Admitting: Primary Care

## 2014-10-13 VITALS — BP 136/62 | HR 73 | Temp 97.5°F | Ht 60.25 in | Wt 139.1 lb

## 2014-10-13 DIAGNOSIS — R350 Frequency of micturition: Secondary | ICD-10-CM | POA: Diagnosis not present

## 2014-10-13 DIAGNOSIS — N898 Other specified noninflammatory disorders of vagina: Secondary | ICD-10-CM | POA: Diagnosis not present

## 2014-10-13 LAB — POCT URINALYSIS DIPSTICK
BILIRUBIN UA: NEGATIVE
Blood, UA: NEGATIVE
GLUCOSE UA: NEGATIVE
Ketones, UA: NEGATIVE
Leukocytes, UA: NEGATIVE
Nitrite, UA: NEGATIVE
Protein, UA: NEGATIVE
Spec Grav, UA: 1.015
UROBILINOGEN UA: NEGATIVE
pH, UA: 6

## 2014-10-13 NOTE — Progress Notes (Signed)
Subjective:    Patient ID: [name], female    DOB: Sep 21, 1935, 79 y.o.   MRN: [id number]  HPI  [name] is a 79 year old female who presents today with a chief complaint of urinary frequency.  She also reports weakness, urinary discomfort, whitish vaginal discharge, vaginal itching, pelvic pressure, and flank pain. Her symptoms began one week ago. Denies fevers, hematuria. She's had her bladder tacked after her last child and reports it's since come down intermittently over the years. She had this evaluated during several office visits ago. She was also found to have low vitamin B12 and is undergoing injections.  Review of Systems  Constitutional: Negative for fever and chills.  Respiratory: Negative for shortness of breath.   Cardiovascular: Negative for chest pain.  Genitourinary: Positive for frequency, flank pain and vaginal discharge. Negative for dysuria, hematuria and difficulty urinating.       Pelvic pressure  Musculoskeletal: Negative for myalgias.       Past Medical History  Diagnosis Date  . Asthma     childhood  . Depression   . Scoliosis     chronic back pain  . Diabetes mellitus     Type II  . HLD (hyperlipidemia)   . HTN (hypertension)   . Osteoarthritis     hands,elbow,knees  . Rhinitis, allergic   . Foot fracture, right 3/13  . H/O scoliosis   . Osteoporosis     History   Social History  . Marital Status: Widowed    Spouse Name: N/A  . Number of Children: N/A  . Years of Education: N/A   Occupational History  . Not on file.   Social History Main Topics  . Smoking status: Never Smoker   . Smokeless tobacco: Not on file  . Alcohol Use: No  . Drug Use: No  . Sexual Activity: Not on file   Other Topics Concern  . Not on file   Social History Narrative    Past Surgical History  Procedure Laterality Date  . Tubal ligation      bilateral  . Bladder tack      Family History  Problem Relation Age of Onset  . Coronary artery  disease Mother   . Cancer Mother     thyroid  . Heart attack Mother   . Coronary artery disease Father   . Heart attack Father   . Multiple sclerosis Sister     Allergies  Allergen Reactions  . Acetaminophen     REACTION: nausea  . Codeine     REACTION: itching  . Fluticasone Propionate     REACTION: palpitations  . Fosamax [Alendronate Sodium]     Difficulty swallowing and fever/ joint pain   . Naproxen     REACTION: GI  . Nsaids     REACTION: elevated LFT's  . Penicillins     REACTION: swelling at inj site    Current Outpatient Prescriptions on File Prior to Visit  Medication Sig Dispense Refill  . albuterol (PROVENTIL HFA;VENTOLIN HFA) 108 (90 BASE) MCG/ACT inhaler Inhale 2 puffs into the lungs every 4 (four) hours as needed for wheezing. 1 Inhaler 3  . amitriptyline (ELAVIL) 25 MG tablet TAKE 1 TABLET BY MOUTH AT BEDTIME 30 tablet 11  . aspirin 81 MG tablet Take 81 mg by mouth daily.      Marland Kitchen atorvastatin (LIPITOR) 40 MG tablet Take 1 tablet (40 mg total) by mouth daily. 90 tablet 3  . Calcium Carbonate-Vitamin  D (CALCIUM 600+D) 600-400 MG-UNIT per tablet Take 2 tablets by mouth daily.      . fish oil-omega-3 fatty acids 1000 MG capsule Take 1 capsule by mouth 2 (two) times daily.     . fluticasone (FLONASE) 50 MCG/ACT nasal spray Place 2 sprays into the nose daily. 16 g 6  . glipiZIDE (GLUCOTROL XL) 5 MG 24 hr tablet TAKE 2 TABLETS (10 MG TOTAL) BY MOUTH DAILY. 180 tablet 0  . glucose blood (FREESTYLE LITE) test strip TEST BLOOD SUGAR ONCE DAILY FOR DM 250.0 50 each 3  . Ibuprofen-Diphenhydramine Cit (ADVIL PM PO) Take by mouth as needed.     . Lancets 28G MISC by Does not apply route. Check sugar once daily and as needed for DM 250.0     . meclizine (ANTIVERT) 25 MG tablet Take 25 mg by mouth as needed.    . metFORMIN (GLUCOPHAGE) 500 MG tablet TAKE 2 TABLETS BY MOUTH EVERY MORNING, 1 TAB AT LUNCH AND 1 TAB AT SUPPER 360 tablet 0  . metoprolol tartrate (LOPRESSOR) 25 MG  tablet Take 1 tablet (25 mg total) by mouth 2 (two) times daily. 180 tablet 1  . Multiple Vitamin (MULTIVITAMIN) tablet Take 1 tablet by mouth daily.      Marland Kitchen omeprazole (PRILOSEC) 20 MG capsule Take 20 mg by mouth daily.    . quinapril (ACCUPRIL) 40 MG tablet Take 1 tablet (40 mg total) by mouth at bedtime. 90 tablet 2   No current facility-administered medications on file prior to visit.    BP 136/62 mmHg  Pulse 73  Temp(Src) 97.5 F (36.4 C) (Oral)  Ht 5' 0.25" (1.53 m)  Wt 139 lb 1.9 oz (63.104 kg)  BMI 26.96 kg/m2  SpO2 97%  LMP 04/22/1985    Objective:   Physical Exam  Constitutional: She appears well-nourished. She does not appear ill.  Cardiovascular: Normal rate and regular rhythm.   Pulmonary/Chest: Effort normal and breath sounds normal.  Abdominal: Soft. There is no CVA tenderness.  Genitourinary:  No discharge or bleeding noted upon pelvic exam. Did notice cystocele. Mild discomfort upon exam.  Skin: Skin is warm and dry.          Assessment & Plan:  Cystitis:  UA: Negative for leuks, blood, nitrites, glucose, protein. Suspect IC. Pelvic exam revealed visualization of bladder without complications. No vaginal discharge. Offered further evaluation at Urology due to cystocele, she will think about it as this is bothersome. Doesn't appear ill. Vitals stable. Stay hydrated with fluids. Will send of wet prep and notify patient of results.  Follow up with PCP if symptoms persist.

## 2014-10-13 NOTE — Addendum Note (Signed)
Addended by: Tawnya Crook on: 10/13/2014 08:48 AM   Modules accepted: Orders

## 2014-10-13 NOTE — Patient Instructions (Addendum)
Your urine test did not show infection. I will send of your vaginal swabs to test for bacteria and yeast. I will call you later with the results.  Continue to drink plenty of water to stay hydrated.  Follow up with PCP if you experiences any worsening of weakness.  Interstitial Cystitis Interstitial cystitis (IC) is a condition that results in discomfort or pain in the bladder and the surrounding pelvic region. The symptoms can be different from case to case and even in the same individual. People may experience:  Mild discomfort.  Pressure.  Tenderness.  Intense pain in the bladder and pelvic area. CAUSES  Because IC varies so much in symptoms and severity, people studying this disease believe it is not one but several diseases. Some caregivers use the term painful bladder syndrome (PBS) to describe cases with painful urinary symptoms. This may not meet the strictest definition of IC. The term IC / PBS includes all cases of urinary pain that cannot be connected to other causes, such as infection or urinary stones.  SYMPTOMS  Symptoms may include:  An urgent need to urinate.  A frequent need to urinate.  A combination of these symptoms. Pain may change in intensity as the bladder fills with urine or as it empties. Women's symptoms often get worse during menstruation. They may sometimes experience pain with vaginal intercourse. Some of the symptoms of IC / PBS seem like those of bacterial infection. Tests do not show infection. IC / PBS is far more common in women than in men.  DIAGNOSIS  The diagnosis of IC / PBS is based on:  Presence of pain related to the bladder, usually along with problems of frequency and urgency.  Not finding other diseases that could cause the symptoms.  Diagnostic tests that help rule out other diseases include:  Urinalysis.  Urine culture.  Cystoscopy.  Biopsy of the bladder wall.  Distension of the bladder under anesthesia.  Urine  cytology.  Laboratory examination of prostate secretions. A biopsy is a tissue sample that can be looked at under a microscope. Samples of the bladder and urethra may be removed during a cystoscopy. A biopsy helps rule out bladder cancer. TREATMENT  Scientists have not yet found a cure for IC / PBS. Patients with IC / PBS do not get better with antibiotic therapy. Caregivers cannot predict who will respond best to which treatment. Symptoms may disappear without explanation. Disappearing symptoms may coincide with an event such as a change in diet or treatment. Even when symptoms disappear, they may return after days, weeks, months, or years.  Because the causes of IC / PBS are unknown, current treatments are aimed at relieving symptoms. Many people are helped by one or a combination of the treatments. As researchers learn more about IC / PBS, the list of potential treatments will change. Patients should discuss their options with a caregiver. SURGERY  Surgery should be considered only if all available treatments have failed and the pain is disabling. Many approaches and techniques are used. Each approach has its own advantages and complications. Advantages and complications should be discussed with a urologist. Your caregiver may recommend consulting another urologist for a second opinion. Most caregivers are reluctant to operate because the outcome is unpredictable. Some people still have symptoms after surgery.  People considering surgery should discuss the potential risks and benefits, side effects, and long- and short-term complications with their family, as well as with people who have already had the procedure. Surgery requires anesthesia,  hospitalization, and in some cases weeks or months of recovery. As the complexity of the procedure increases, so do the chances for complications and for failure. HOME CARE INSTRUCTIONS   All drugs, even those sold over the counter, have side effects. Patients  should always consult a caregiver before using any drug for an extended amount of time. Only take over-the-counter or prescription medicines for pain, discomfort, or fever as directed by your caregiver.  Many patients feel that smoking makes their symptoms worse. How the by-products of tobacco that are excreted in the urine affect IC / PBS is unknown. Smoking is the major known cause of bladder cancer. One of the best things smokers can do for their bladder and their overall health is to quit.  Many patients feel that gentle stretching exercises help relieve IC / PBS symptoms.  Methods vary, but basically patients decide to empty their bladder at designated times and use relaxation techniques and distractions to keep to the schedule. Gradually, patients try to lengthen the time between scheduled voids. A diary in which to record voiding times is usually helpful in keeping track of progress. MAKE SURE YOU:   Understand these instructions.  Will watch your condition.  Will get help right away if you are not doing well or get worse. Document Released: 12/08/2003 Document Revised: 07/01/2011 Document Reviewed: 02/22/2008 [facility] Patient Information 2015 [place], [place]. This information is not intended to replace advice given to you by your health care provider. Make sure you discuss any questions you have with your health care provider.

## 2014-10-13 NOTE — Progress Notes (Signed)
Pre visit review using our clinic review tool, if applicable. No additional management support is needed unless otherwise documented below in the visit note. 

## 2014-10-14 LAB — WET PREP BY MOLECULAR PROBE
Candida species: NEGATIVE
GARDNERELLA VAGINALIS: NEGATIVE
Trichomonas vaginosis: NEGATIVE

## 2014-10-19 ENCOUNTER — Ambulatory Visit (INDEPENDENT_AMBULATORY_CARE_PROVIDER_SITE_OTHER): Payer: Medicare Other | Admitting: *Deleted

## 2014-10-19 DIAGNOSIS — E538 Deficiency of other specified B group vitamins: Secondary | ICD-10-CM | POA: Diagnosis not present

## 2014-10-19 MED ORDER — CYANOCOBALAMIN 1000 MCG/ML IJ SOLN
1000.0000 ug | Freq: Once | INTRAMUSCULAR | Status: AC
  Administered 2014-10-19: 1000 ug via INTRAMUSCULAR

## 2014-10-26 ENCOUNTER — Ambulatory Visit (INDEPENDENT_AMBULATORY_CARE_PROVIDER_SITE_OTHER): Payer: Medicare Other | Admitting: *Deleted

## 2014-10-26 DIAGNOSIS — E538 Deficiency of other specified B group vitamins: Secondary | ICD-10-CM

## 2014-10-26 MED ORDER — CYANOCOBALAMIN 1000 MCG/ML IJ SOLN
1000.0000 ug | Freq: Once | INTRAMUSCULAR | Status: AC
  Administered 2014-10-26: 1000 ug via INTRAMUSCULAR

## 2014-10-28 ENCOUNTER — Other Ambulatory Visit: Payer: Self-pay | Admitting: Family Medicine

## 2014-10-28 NOTE — Telephone Encounter (Signed)
Please refill for a year  

## 2014-10-28 NOTE — Telephone Encounter (Signed)
done

## 2014-10-28 NOTE — Telephone Encounter (Signed)
Pt had CPE on 09/28/14, last refill was on 11/02/13 #30 with 11 additional refills, please advise

## 2014-11-02 ENCOUNTER — Ambulatory Visit (INDEPENDENT_AMBULATORY_CARE_PROVIDER_SITE_OTHER): Payer: Medicare Other | Admitting: *Deleted

## 2014-11-02 DIAGNOSIS — E538 Deficiency of other specified B group vitamins: Secondary | ICD-10-CM

## 2014-11-02 MED ORDER — CYANOCOBALAMIN 1000 MCG/ML IJ SOLN
1000.0000 ug | Freq: Once | INTRAMUSCULAR | Status: AC
  Administered 2014-11-02: 1000 ug via INTRAMUSCULAR

## 2014-11-08 ENCOUNTER — Telehealth: Payer: Self-pay | Admitting: Family Medicine

## 2014-11-08 DIAGNOSIS — I10 Essential (primary) hypertension: Secondary | ICD-10-CM

## 2014-11-08 DIAGNOSIS — E785 Hyperlipidemia, unspecified: Secondary | ICD-10-CM

## 2014-11-08 DIAGNOSIS — E119 Type 2 diabetes mellitus without complications: Secondary | ICD-10-CM

## 2014-11-08 NOTE — Telephone Encounter (Signed)
-----   Message from Alvina Chouerri J Walsh sent at 11/07/2014 10:51 AM EDT ----- Regarding: Lab orders for Wednesday, 7.20.16 Patient is scheduled for CPX labs, please order future labs, Thanks , Camelia Engerri

## 2014-11-09 ENCOUNTER — Other Ambulatory Visit (INDEPENDENT_AMBULATORY_CARE_PROVIDER_SITE_OTHER): Payer: Medicare Other

## 2014-11-09 DIAGNOSIS — E538 Deficiency of other specified B group vitamins: Secondary | ICD-10-CM | POA: Diagnosis not present

## 2014-11-10 LAB — VITAMIN B12: Vitamin B-12: 1017 pg/mL — ABNORMAL HIGH (ref 211–911)

## 2014-11-11 ENCOUNTER — Other Ambulatory Visit: Payer: Self-pay

## 2014-11-11 DIAGNOSIS — E538 Deficiency of other specified B group vitamins: Secondary | ICD-10-CM

## 2014-11-11 MED ORDER — CYANOCOBALAMIN 1000 MCG/ML IJ SOLN: 1000.0000 ug | INTRAMUSCULAR | Status: DC

## 2014-11-17 ENCOUNTER — Ambulatory Visit: Payer: Medicare Other

## 2014-11-17 NOTE — Addendum Note (Signed)
Addended by: Josph Macho A on: 11/17/2014 03:33 PM   Modules accepted: Medications

## 2014-12-11 ENCOUNTER — Other Ambulatory Visit: Payer: Self-pay | Admitting: Family Medicine

## 2014-12-20 ENCOUNTER — Ambulatory Visit (INDEPENDENT_AMBULATORY_CARE_PROVIDER_SITE_OTHER): Payer: Medicare Other | Admitting: *Deleted

## 2014-12-20 DIAGNOSIS — D519 Vitamin B12 deficiency anemia, unspecified: Secondary | ICD-10-CM | POA: Diagnosis not present

## 2014-12-20 MED ORDER — CYANOCOBALAMIN 1000 MCG/ML IJ SOLN
1000.0000 ug | Freq: Once | INTRAMUSCULAR | Status: AC
  Administered 2014-12-20: 1000 ug via INTRAMUSCULAR

## 2015-01-24 ENCOUNTER — Ambulatory Visit (INDEPENDENT_AMBULATORY_CARE_PROVIDER_SITE_OTHER): Payer: Medicare Other | Admitting: *Deleted

## 2015-01-24 DIAGNOSIS — Z23 Encounter for immunization: Secondary | ICD-10-CM | POA: Diagnosis not present

## 2015-01-24 DIAGNOSIS — E538 Deficiency of other specified B group vitamins: Secondary | ICD-10-CM | POA: Diagnosis not present

## 2015-01-24 MED ORDER — CYANOCOBALAMIN 1000 MCG/ML IJ SOLN
1000.0000 ug | Freq: Once | INTRAMUSCULAR | Status: AC
  Administered 2015-01-24: 1000 ug via INTRAMUSCULAR

## 2015-02-20 ENCOUNTER — Telehealth: Payer: Self-pay | Admitting: Family Medicine

## 2015-02-20 MED ORDER — CYANOCOBALAMIN 1000 MCG/ML IJ SOLN: 1000.0000 ug | INTRAMUSCULAR | Status: DC

## 2015-02-20 NOTE — Telephone Encounter (Signed)
Pt called wanting to get her b12 shots at the [place] office its closer to her home  I spoke with Rasheedah @ [facility]  she said they would need an order in the system before they could schedule appointment there

## 2015-02-20 NOTE — Telephone Encounter (Signed)
I signed the order (no print)- please forward to whoever needs the order at [facility]-thanks

## 2015-02-21 ENCOUNTER — Other Ambulatory Visit: Payer: Self-pay | Admitting: Family Medicine

## 2015-02-21 ENCOUNTER — Telehealth: Payer: Self-pay

## 2015-02-21 DIAGNOSIS — E538 Deficiency of other specified B group vitamins: Secondary | ICD-10-CM

## 2015-02-21 MED ORDER — CYANOCOBALAMIN 1000 MCG/ML IJ SOLN
1000.0000 ug | Freq: Once | INTRAMUSCULAR | Status: DC
Start: 2015-02-21 — End: 2015-02-21

## 2015-02-21 NOTE — Telephone Encounter (Signed)
Pt aware transferred  Call to [place] so pt could make appointment

## 2015-02-21 NOTE — Telephone Encounter (Signed)
Patient called and states that she would like to get a B12 shot here at our office. Patient states that when she tries to schedule, she is told that orders need to be placed. It looks like orders are in on clinical side.

## 2015-02-22 ENCOUNTER — Ambulatory Visit (INDEPENDENT_AMBULATORY_CARE_PROVIDER_SITE_OTHER): Payer: Medicare Other

## 2015-02-22 DIAGNOSIS — E538 Deficiency of other specified B group vitamins: Secondary | ICD-10-CM

## 2015-02-22 MED ORDER — CYANOCOBALAMIN 1000 MCG/ML IJ SOLN
1000.0000 ug | Freq: Once | INTRAMUSCULAR | Status: AC
  Administered 2015-02-22: 1000 ug via INTRAMUSCULAR

## 2015-02-22 NOTE — Progress Notes (Signed)
Patient came to our office for her b12 injection.  Received in Left deltoid.  Patient tolerated well.

## 2015-03-01 ENCOUNTER — Ambulatory Visit (INDEPENDENT_AMBULATORY_CARE_PROVIDER_SITE_OTHER): Payer: Medicare Other

## 2015-03-01 DIAGNOSIS — E538 Deficiency of other specified B group vitamins: Secondary | ICD-10-CM | POA: Diagnosis not present

## 2015-03-01 MED ORDER — CYANOCOBALAMIN 1000 MCG/ML IJ SOLN
1000.0000 ug | Freq: Once | INTRAMUSCULAR | Status: AC
  Administered 2015-03-01: 1000 ug via INTRAMUSCULAR

## 2015-03-01 NOTE — Progress Notes (Signed)
Patient came in for B12 injection.  Received in Right deltoid.  Patient tolerated well.  Reminded patient to schedule next weeks injection.

## 2015-03-06 ENCOUNTER — Other Ambulatory Visit: Payer: Self-pay | Admitting: *Deleted

## 2015-03-06 ENCOUNTER — Telehealth: Payer: Self-pay | Admitting: *Deleted

## 2015-03-06 MED ORDER — METOPROLOL TARTRATE 25 MG PO TABS: 25.0000 mg | ORAL_TABLET | Freq: Two times a day (BID) | ORAL | Status: DC

## 2015-03-06 NOTE — Telephone Encounter (Signed)
*  STAT* If patient is at the pharmacy, call can be transferred to refill team  1. Which medications need to be refilled? (please list name of each medication and dose if known) metoprolol tartrate (LOPRESSOR) 25 MG tablet  2. Which pharmacy/location (including street and city if local pharmacy) is [facility] to be [facility] to[facility]  [address]  [place]  [place]  [phone]  3. Do they need a 30 day or 90 day supply? 90 day supply

## 2015-03-06 NOTE — Telephone Encounter (Signed)
Metoprolol 25 mg BID sent to local pharmacy for refill.

## 2015-03-08 ENCOUNTER — Ambulatory Visit: Payer: Medicare Other

## 2015-03-08 ENCOUNTER — Other Ambulatory Visit: Payer: Self-pay

## 2015-03-08 MED ORDER — GLUCOSE BLOOD VI STRP: ORAL_STRIP | Status: DC

## 2015-03-08 MED ORDER — ONETOUCH VERIO FLEX SYSTEM W/DEVICE KIT
1.0000 | PACK | Freq: Every day | Status: DC
Start: 2015-03-08 — End: 2024-01-16

## 2015-03-08 MED ORDER — ONETOUCH LANCETS MISC: Status: DC

## 2015-03-08 NOTE — Telephone Encounter (Signed)
[facility] free onetouch verio flex meter; pt request order for meter,test strips and l[facility]ets [facility] [address] Pt said she did not need to ck with ins co about coverage. Advised pt diabetic supplies sent electronically as requested.annual exam 09/28/14.

## 2015-03-09 ENCOUNTER — Ambulatory Visit: Payer: Medicare Other

## 2015-03-09 ENCOUNTER — Ambulatory Visit (INDEPENDENT_AMBULATORY_CARE_PROVIDER_SITE_OTHER): Payer: Medicare Other

## 2015-03-09 DIAGNOSIS — E538 Deficiency of other specified B group vitamins: Secondary | ICD-10-CM

## 2015-03-09 MED ORDER — CYANOCOBALAMIN 1000 MCG/ML IJ SOLN
1000.0000 ug | Freq: Once | INTRAMUSCULAR | Status: AC
  Administered 2015-03-09: 1000 ug via INTRAMUSCULAR

## 2015-03-09 NOTE — Progress Notes (Signed)
Patient came in for last weekly b12 injection.  Received in Left deltoid.  Patient tolerated well.

## 2015-03-15 ENCOUNTER — Encounter: Payer: Self-pay | Admitting: Cardiovascular Disease

## 2015-03-15 ENCOUNTER — Ambulatory Visit (INDEPENDENT_AMBULATORY_CARE_PROVIDER_SITE_OTHER): Payer: Medicare Other | Admitting: Cardiovascular Disease

## 2015-03-15 VITALS — BP 138/72 | HR 83 | Ht 59.0 in | Wt 145.5 lb

## 2015-03-15 DIAGNOSIS — I10 Essential (primary) hypertension: Secondary | ICD-10-CM | POA: Insufficient documentation

## 2015-03-15 DIAGNOSIS — R000 Tachycardia, unspecified: Secondary | ICD-10-CM | POA: Diagnosis not present

## 2015-03-15 DIAGNOSIS — E1159 Type 2 diabetes mellitus with other circulatory complications: Secondary | ICD-10-CM | POA: Insufficient documentation

## 2015-03-15 NOTE — Patient Instructions (Signed)
Medication Instructions:  Your physician recommends that you continue on your current medications as directed. Please refer to the Current Medication list given to you today.   Labwork: none  Testing/Procedures: none  Follow-Up: Your physician wants you to follow-up in: one year in [place]. You will receive a reminder letter in the mail two months in advance. If you don't receive a letter, please call our office to schedule the follow-up appointment.   Any Other Special Instructions Will Be Listed Below (If Applicable).     If you need a refill on your cardiac medications before your next appointment, please call your pharmacy.

## 2015-03-15 NOTE — Progress Notes (Signed)
[name] Date of Birth  09/30/1935       [facility]    [employer] [address], [address], [place], [place]  [address]   [place], [place]  [address] [phone]     [phone]   Fax  [phone]    Fax [phone]  Problem List: 1. Tachycardia 2. Hypertension 3. Diabetes Mellitus 4. hyperlipidemia  History of Present Illness:  Nesa was seen several months ago for palpitations.  She was started on Metoprolol and her HR is better.  She does not smoke.  She does not get any regular exercise.  She has had a fracture of her right foot and still is not able to walk much.  November 17, 2012:  Lourie is doing well.  She has some pains from her right knee - needs replacement.      Jan. 26, 2015:  Ilani is doing well.  She has not had her knee replacement.    She does have some mild DOE but most likely due to deconditioning.    Dec. 4, 2015:  Suda is a 79 yo who is followed for HTN and tachycardia.  Nov. 23, 2016:  Doing well from a cardiac standpoint.  Had a flu like illness several weeks ago BP and HR are ok      Current Outpatient Prescriptions on File Prior to Visit  Medication Sig Dispense Refill  . albuterol (PROVENTIL HFA;VENTOLIN HFA) 108 (90 BASE) MCG/ACT inhaler Inhale 2 puffs into the lungs every 4 (four) hours as needed for wheezing. 1 Inhaler 3  . amitriptyline (ELAVIL) 25 MG tablet TAKE 1 TABLET BY MOUTH AT BEDTIME 30 tablet 11  . aspirin 81 MG tablet Take 81 mg by mouth daily.      Marland Kitchen atorvastatin (LIPITOR) 40 MG tablet Take 1 tablet (40 mg total) by mouth daily. 90 tablet 3  . Blood Glucose Monitoring Suppl (ONETOUCH VERIO FLEX SYSTEM) W/DEVICE KIT 1 Device by Other route daily. Check blood sugar once daily and as directed. Dx E11.9 1 kit 0  . Calcium Carbonate-Vitamin D (CALCIUM 600+D) 600-400 MG-UNIT per tablet Take 2 tablets by mouth daily.      . cyanocobalamin (,VITAMIN B-12,) 1000  MCG/ML injection Inject 1 mL (1,000 mcg total) into the muscle every 6 (six) weeks. 1 mL 11  . fish oil-omega-3 fatty acids 1000 MG capsule Take 1 capsule by mouth 2 (two) times daily.     . fluticasone (FLONASE) 50 MCG/ACT nasal spray Place 2 sprays into the nose daily. 16 g 6  . glipiZIDE (GLUCOTROL XL) 5 MG 24 hr tablet TAKE 2 TABLETS (10 MG TOTAL) BY MOUTH DAILY. 180 tablet 1  . glucose blood (ONETOUCH VERIO) test strip Check blood sugar once daily and as directed. Dx E11.9 100 each 3  . Ibuprofen-Diphenhydramine Cit (ADVIL PM PO) Take by mouth as needed.     . meclizine (ANTIVERT) 25 MG tablet Take 25 mg by mouth as needed.    . metFORMIN (GLUCOPHAGE) 500 MG tablet TAKE 2 TABLETS BY MOUTH EVERY MORNING, 1 TAB AT LUNCH AND 1 TAB AT SUPPER 360 tablet 1  . metoprolol tartrate (LOPRESSOR) 25 MG tablet Take 1 tablet (25 mg total) by mouth 2 (two) times daily. 180 tablet 1  . Multiple Vitamin (MULTIVITAMIN) tablet Take 1 tablet by mouth daily.      Marland Kitchen omeprazole (PRILOSEC) 20 MG capsule Take 20 mg by mouth daily.    Marland Kitchen  ONE TOUCH LANCETS MISC Check blood sugar once daily and as directed. Dx E11.9 200 each 3  . quinapril (ACCUPRIL) 40 MG tablet Take 1 tablet (40 mg total) by mouth at bedtime. 90 tablet 2   No current facility-administered medications on file prior to visit.    Allergies  Allergen Reactions  . Acetaminophen     REACTION: nausea  . Codeine     REACTION: itching  . Fluticasone Propionate     REACTION: palpitations  . Fosamax [Alendronate Sodium]     Difficulty swallowing and fever/ joint pain   . Naproxen     REACTION: GI  . Nsaids     REACTION: elevated LFT's  . Penicillins     REACTION: swelling at inj site    Past Medical History  Diagnosis Date  . Asthma     childhood  . Depression   . Scoliosis     chronic back pain  . Diabetes mellitus     Type II  . HLD (hyperlipidemia)   . HTN (hypertension)   . Osteoarthritis     hands,elbow,knees  . Rhinitis,  allergic   . Foot fracture, right 3/13  . H/O scoliosis   . Osteoporosis     Past Surgical History  Procedure Laterality Date  . Tubal ligation      bilateral  . Bladder tack      History  Smoking status  . Never Smoker   Smokeless tobacco  . Not on file    History  Alcohol Use No    Family History  Problem Relation Age of Onset  . Coronary artery disease Mother   . Cancer Mother     thyroid  . Heart attack Mother   . Coronary artery disease Father   . Heart attack Father   . Multiple sclerosis Sister     Reviw of Systems:  Reviewed in the HPI.  All other systems are negative.  Physical Exam: Blood pressure 138/72, pulse 83, height _0  (1.499 m), weight 145 lb 8 oz (65.998 kg), last menstrual period 04/22/1985. General: Well developed, well nourished, in no acute distress.  Head: Normocephalic, atraumatic, sclera non-icteric, mucus membranes are moist,   Neck: Supple. Carotids are 2 + without bruits. No JVD  Lungs: Clear bilaterally to auscultation.  Heart: regular rate.  normal  S1 S2. No murmurs, gallops or rubs.  Abdomen: Soft, non-tender, non-distended with normal bowel sounds. No hepatomegaly. No rebound/guarding. No masses.  Msk:  Strength and tone are normal  Extremities: No clubbing or cyanosis. No edema.  Distal pedal pulses are 2+ and equal bilaterally.  Neuro: Alert and oriented X 3. Moves all extremities spontaneously.  Gait is a bit slow.  She has a bad right knee.   She favors her right leg.  Psych:  Responds to questions appropriately with a normal affect.  ECG: 03/15/2015: Normal sinus rhythm at 83. The EKG is normal.  Assessment / Plan:   1. Tachycardia- tolerating meds well.  2. Hypertension - BP is well controlled.  Continue metoprolol and quinipril  Follow up with her medical doctor .  She may see [place] in 12 months.   3. Diabetes Mellitus 4. Hyperlipidemia    Zaia Carre, Wonda Cheng, MD  03/15/2015 3:19 PM    [facility] [place],  [place], [place]  [address] Pager [phone] Phone: [phone]; Fax: [phone]   [place]  [address]  [place], [place]  [address] [phone]   Fax [phone]

## 2015-03-21 ENCOUNTER — Other Ambulatory Visit: Payer: Self-pay | Admitting: Cardiovascular Disease

## 2015-04-12 ENCOUNTER — Ambulatory Visit (INDEPENDENT_AMBULATORY_CARE_PROVIDER_SITE_OTHER): Payer: Medicare Other

## 2015-04-12 DIAGNOSIS — E538 Deficiency of other specified B group vitamins: Secondary | ICD-10-CM | POA: Diagnosis not present

## 2015-04-12 MED ORDER — CYANOCOBALAMIN 1000 MCG/ML IJ SOLN
1000.0000 ug | Freq: Once | INTRAMUSCULAR | Status: AC
  Administered 2015-04-12: 1000 ug via INTRAMUSCULAR

## 2015-04-12 NOTE — Progress Notes (Signed)
Patient came in for B12 injection.  Received in Right deltoid.  Patient tolerated well.  

## 2015-05-16 ENCOUNTER — Ambulatory Visit (INDEPENDENT_AMBULATORY_CARE_PROVIDER_SITE_OTHER): Payer: Medicare Other

## 2015-05-16 DIAGNOSIS — E538 Deficiency of other specified B group vitamins: Secondary | ICD-10-CM

## 2015-05-16 MED ORDER — CYANOCOBALAMIN 1000 MCG/ML IJ SOLN
1000.0000 ug | Freq: Once | INTRAMUSCULAR | Status: AC
Start: 2015-05-16 — End: 2015-05-16
  Administered 2015-05-16: 1000 ug via INTRAMUSCULAR

## 2015-05-16 NOTE — Progress Notes (Signed)
Patient came for B12 injection.  Received in Right deltoid.  Patient tolerated well.

## 2015-06-08 ENCOUNTER — Other Ambulatory Visit: Payer: Self-pay | Admitting: Family Medicine

## 2015-07-08 ENCOUNTER — Other Ambulatory Visit: Payer: Self-pay | Admitting: Family Medicine

## 2015-09-01 ENCOUNTER — Other Ambulatory Visit: Payer: Self-pay | Admitting: Cardiovascular Disease

## 2015-09-07 ENCOUNTER — Other Ambulatory Visit: Payer: Self-pay | Admitting: Family Medicine

## 2015-09-07 NOTE — Telephone Encounter (Signed)
Please schedule a fall annual exam and AMW (or later if no appts in the fall) and refill until then

## 2015-09-07 NOTE — Telephone Encounter (Signed)
Electronic refill request, no recent/future appts please advise

## 2015-09-08 ENCOUNTER — Telehealth: Payer: Self-pay | Admitting: Family Medicine

## 2015-09-08 NOTE — Telephone Encounter (Signed)
Left message asking pt to call office Pt needs cpx and medicare wellness with dr tower this fall  Dr tower next cpe 11/7

## 2015-09-08 NOTE — Telephone Encounter (Signed)
Jefm BryantLinda T. Was given pt's info and she said she will have [place] or Misty StanleyLisa schedule her appts. Med refilled once

## 2015-09-13 NOTE — Telephone Encounter (Signed)
cpx 11/29 Medicare wellness and labs  11/20 Pt aware

## 2015-09-28 ENCOUNTER — Other Ambulatory Visit: Payer: Self-pay | Admitting: Family Medicine

## 2015-09-29 ENCOUNTER — Other Ambulatory Visit: Payer: Self-pay | Admitting: Family Medicine

## 2015-10-17 ENCOUNTER — Telehealth: Payer: Self-pay | Admitting: Family Medicine

## 2015-10-17 NOTE — Telephone Encounter (Signed)
Patient said her pharmacy told patient she needs to schedule an office visit to get further refills.  Patient scheduled her physical,lab work, and medicare wellness exam in November.  Does patient need to be seen before November to get refills? Please call patient.

## 2015-10-17 NOTE — Telephone Encounter (Signed)
Pt didn't need a refill right now, I advise pt that since she has a CPE scheduled next time she needs a refill she should contact pharmacy like normal and we will fill med since she has her CPE scheduled, pt verbalized understanding

## 2015-10-19 ENCOUNTER — Other Ambulatory Visit: Payer: Self-pay | Admitting: Family Medicine

## 2015-11-26 ENCOUNTER — Other Ambulatory Visit: Payer: Self-pay | Admitting: Family Medicine

## 2015-12-08 ENCOUNTER — Other Ambulatory Visit: Payer: Self-pay | Admitting: Family Medicine

## 2016-01-23 ENCOUNTER — Other Ambulatory Visit: Payer: Self-pay | Admitting: *Deleted

## 2016-01-23 MED ORDER — AMITRIPTYLINE HCL 25 MG PO TABS: 25.0000 mg | ORAL_TABLET | Freq: Every day | ORAL | 0 refills | Status: DC

## 2016-01-23 NOTE — Telephone Encounter (Signed)
[facility] to [facility]'s [facility], done

## 2016-02-23 ENCOUNTER — Other Ambulatory Visit: Payer: Self-pay | Admitting: Family Medicine

## 2016-03-04 ENCOUNTER — Other Ambulatory Visit: Payer: Self-pay | Admitting: Family Medicine

## 2016-03-10 ENCOUNTER — Telehealth: Payer: Self-pay | Admitting: Family Medicine

## 2016-03-10 DIAGNOSIS — E119 Type 2 diabetes mellitus without complications: Secondary | ICD-10-CM

## 2016-03-10 DIAGNOSIS — Z0000 Encounter for general adult medical examination without abnormal findings: Secondary | ICD-10-CM

## 2016-03-10 NOTE — Telephone Encounter (Signed)
-----   Message from Alvina Chouerri J Walsh sent at 03/06/2016 10:29 AM EST ----- Regarding: Lab orders for 11.20.17  AWV lab orders, please.

## 2016-03-11 ENCOUNTER — Other Ambulatory Visit: Payer: Medicare Other

## 2016-03-11 ENCOUNTER — Ambulatory Visit (INDEPENDENT_AMBULATORY_CARE_PROVIDER_SITE_OTHER): Payer: Medicare Other

## 2016-03-11 VITALS — BP 120/72 | HR 89 | Temp 98.0°F | Ht 59.0 in | Wt 141.5 lb

## 2016-03-11 DIAGNOSIS — R7989 Other specified abnormal findings of blood chemistry: Secondary | ICD-10-CM | POA: Diagnosis not present

## 2016-03-11 DIAGNOSIS — Z0000 Encounter for general adult medical examination without abnormal findings: Secondary | ICD-10-CM | POA: Diagnosis not present

## 2016-03-11 DIAGNOSIS — E119 Type 2 diabetes mellitus without complications: Secondary | ICD-10-CM | POA: Diagnosis not present

## 2016-03-11 LAB — COMPREHENSIVE METABOLIC PANEL
ALT: 28 U/L (ref 0–35)
AST: 32 U/L (ref 0–37)
Albumin: 4.3 g/dL (ref 3.5–5.2)
Alkaline Phosphatase: 43 U/L (ref 39–117)
BUN: 16 mg/dL (ref 6–23)
CHLORIDE: 104 meq/L (ref 96–112)
CO2: 25 meq/L (ref 19–32)
CREATININE: 1.26 mg/dL — AB (ref 0.40–1.20)
Calcium: 10 mg/dL (ref 8.4–10.5)
GFR: 43.61 mL/min — ABNORMAL LOW (ref >60.00)
GLUCOSE: 144 mg/dL — AB (ref 70–99)
Potassium: 4 mEq/L (ref 3.5–5.1)
SODIUM: 139 meq/L (ref 135–145)
Total Bilirubin: 0.7 mg/dL (ref 0.2–1.2)
Total Protein: 7.3 g/dL (ref 6.0–8.3)

## 2016-03-11 LAB — TSH: TSH: 0.87 u[IU]/mL (ref 0.35–4.50)

## 2016-03-11 LAB — CBC WITH DIFFERENTIAL/PLATELET
BASOS PCT: 0.4 % (ref 0.0–3.0)
Basophils Absolute: 0 10*3/uL (ref 0.0–0.1)
EOS ABS: 0.1 10*3/uL (ref 0.0–0.7)
Eosinophils Relative: 1.9 % (ref 0.0–5.0)
HEMATOCRIT: 32.4 % — AB (ref 36.0–46.0)
Hemoglobin: 10.7 g/dL — ABNORMAL LOW (ref 12.0–15.0)
LYMPHS ABS: 2.2 10*3/uL (ref 0.7–4.0)
LYMPHS PCT: 29 % (ref 12.0–46.0)
MCHC: 33.1 g/dL (ref 30.0–36.0)
MCV: 88.7 fl (ref 78.0–100.0)
MONO ABS: 0.5 10*3/uL (ref 0.1–1.0)
Monocytes Relative: 6.2 % (ref 3.0–12.0)
NEUTROS ABS: 4.7 10*3/uL (ref 1.4–7.7)
NEUTROS PCT: 62.5 % (ref 43.0–77.0)
PLATELETS: 232 10*3/uL (ref 150.0–400.0)
RBC: 3.65 Mil/uL — ABNORMAL LOW (ref 3.87–5.11)
RDW: 14.1 % (ref 11.5–15.5)
WBC: 7.5 10*3/uL (ref 4.0–10.5)

## 2016-03-11 LAB — LIPID PANEL
CHOLESTEROL: 200 mg/dL (ref 0–200)
HDL: 47.8 mg/dL (ref >39.00)
LDL CALC: 124 mg/dL — AB (ref 0–99)
NonHDL: 151.91
Total CHOL/HDL Ratio: 4
Triglycerides: 138 mg/dL (ref 0.0–149.0)
VLDL: 27.6 mg/dL (ref 0.0–40.0)

## 2016-03-11 LAB — HEMOGLOBIN A1C: Hgb A1c MFr Bld: 7.1 % — ABNORMAL HIGH (ref 4.6–6.5)

## 2016-03-11 LAB — VITAMIN B12: VITAMIN B 12: 347 pg/mL (ref 211–911)

## 2016-03-11 NOTE — Progress Notes (Signed)
Pre visit review using our clinic review tool, if applicable. No additional management support is needed unless otherwise documented below in the visit note. 

## 2016-03-11 NOTE — Progress Notes (Signed)
Subjective:   [name] is a 80 y.o. female who presents for Medicare Annual (Subsequent) preventive examination.  Review of Systems:  N/A Cardiac Risk Factors include: advanced age (>68mn, >>7women);diabetes mellitus;dyslipidemia;hypertension     Objective:     Vitals: BP 120/72 (BP Location: Left Arm, Patient Position: Sitting, Cuff Size: Normal)   Pulse 89   Temp 98 F (36.7 C) (Oral)   Ht '4\' 11"'$  (1.499 m) Comment: no shoes  Wt 141 lb 8 oz (64.2 kg)   LMP 04/22/1985   SpO2 95%   BMI 28.58 kg/m   Body mass index is 28.58 kg/m.   Tobacco History  Smoking Status  . Never Smoker  Smokeless Tobacco  . Never Used     Counseling given: No   Past Medical History:  Diagnosis Date  . Asthma    childhood  . Depression   . Diabetes mellitus    Type II  . Foot fracture, right 3/13  . H/O scoliosis   . HLD (hyperlipidemia)   . HTN (hypertension)   . Osteoarthritis    hands,elbow,knees  . Osteoporosis   . Rhinitis, allergic   . Scoliosis    chronic back pain   Past Surgical History:  Procedure Laterality Date  . bladder tack    . TUBAL LIGATION     bilateral   Family History  Problem Relation Age of Onset  . Coronary artery disease Mother   . Cancer Mother     thyroid  . Heart attack Mother   . Coronary artery disease Father   . Heart attack Father   . Multiple sclerosis Sister    History  Sexual Activity  . Sexual activity: No    Outpatient Encounter Prescriptions as of 03/11/2016  Medication Sig  . albuterol (PROVENTIL HFA;VENTOLIN HFA) 108 (90 BASE) MCG/ACT inhaler Inhale 2 puffs into the lungs every 4 (four) hours as needed for wheezing.  .Marland Kitchenamitriptyline (ELAVIL) 25 MG tablet Take 1 tablet (25 mg total) by mouth at bedtime.  .Marland Kitchenaspirin 81 MG tablet Take 81 mg by mouth daily.    .Marland Kitchenatorvastatin (LIPITOR) 40 MG tablet TAKE 1 TABLET BY MOUTH DAILY  . Blood Glucose Monitoring Suppl (ONETOUCH VERIO FLEX SYSTEM) W/DEVICE KIT 1 Device by  Other route daily. Check blood sugar once daily and as directed. Dx E11.9  . Calcium Carbonate-Vitamin D (CALCIUM 600+D) 600-400 MG-UNIT per tablet Take 2 tablets by mouth daily.    . cyanocobalamin (,VITAMIN B-12,) 1000 MCG/ML injection Inject 1 mL (1,000 mcg total) into the muscle every 6 (six) weeks.  . fish oil-omega-3 fatty acids 1000 MG capsule Take 1 capsule by mouth 2 (two) times daily.   . fluticasone (FLONASE) 50 MCG/ACT nasal spray Place 2 sprays into the nose daily.  .Marland KitchenglipiZIDE (GLUCOTROL XL) 5 MG 24 hr tablet Take 1 tablet (5 mg total) by mouth daily.  . Ibuprofen-Diphenhydramine Cit (ADVIL PM PO) Take by mouth as needed.   . meclizine (ANTIVERT) 25 MG tablet Take 25 mg by mouth as needed.  . metFORMIN (GLUCOPHAGE) 500 MG tablet TAKE 2 TABLETS BY MOUTH EVERY MORNING, 1 TAB AT LUNCH AND 1 TAB AT SUPPER  . metoprolol tartrate (LOPRESSOR) 25 MG tablet TAKE 1 TABLET (25 MG TOTAL) BY MOUTH 2 (TWO) TIMES DAILY.  . Multiple Vitamin (MULTIVITAMIN) tablet Take 1 tablet by mouth daily.    .Marland Kitchenomeprazole (PRILOSEC) 20 MG capsule Take 20 mg by mouth daily.  . ONE TOUCH LANCETS MISC  Check blood sugar once daily and as directed. Dx E11.9  . ONETOUCH VERIO test strip CHECK BLOOD SUGAR ONCE DAILY AND AS DIRECTED. DX E11.9  . quinapril (ACCUPRIL) 40 MG tablet TAKE 1 TABLET AT BEDTIME  . amLODipine (NORVASC) 10 MG tablet Take 1 tablet (10 mg total) by mouth daily. * Needs an appointment with Dr for additional refills* (Patient not taking: Reported on 03/11/2016)   No facility-administered encounter medications on file as of 03/11/2016.     Activities of Daily Living In your present state of health, do you have any difficulty performing the following activities: 03/11/2016  Hearing? N  Vision? N  Difficulty concentrating or making decisions? N  Walking or climbing stairs? Y  Dressing or bathing? N  Doing errands, shopping? N  Preparing Food and eating ? N  Using the Toilet? N  In the past six  months, have you accidently leaked urine? N  Do you have problems with loss of bowel control? N  Managing your Medications? N  Managing your Finances? N  Housekeeping or managing your Housekeeping? N  Some recent data might be hidden    Patient Care Team: Abner Greenspan, MD as PCP - General    Assessment:     Hearing Screening   '125Hz'$  '250Hz'$  '500Hz'$  '1000Hz'$  '2000Hz'$  '3000Hz'$  '4000Hz'$  '6000Hz'$  '8000Hz'$   Right ear:   0 0 40  0    Left ear:   0 0 0  0      Visual Acuity Screening   Right eye Left eye Both eyes  Without correction:     With correction: 20/40-1 20/30 20/25-1    Exercise Activities and Dietary recommendations Current Exercise Habits: Home exercise routine, Type of exercise: walking, Time (Minutes): 60, Frequency (Times/Week): 7, Weekly Exercise (Minutes/Week): 420, Intensity: Moderate, Exercise limited by: None identified  Goals    . Increase physical activity          Starting 03/11/2016, I will continue to exercise for at least 60 min daily.       Fall Risk Fall Risk  03/11/2016 09/28/2014 06/22/2013  Falls in the past year? Yes No Yes  Number falls in past yr: 2 or more - 2 or more  Injury with Fall? No - -  Risk Factor Category  - - High Fall Risk  Follow up Falls evaluation completed - -   Depression Screen PHQ 2/9 Scores 03/11/2016 09/28/2014 06/22/2013 01/31/2012  PHQ - 2 Score 0 0 0 0     Cognitive Function MMSE - Mini Mental State Exam 03/11/2016  Orientation to time 5  Orientation to Place 5  Registration 3  Attention/ Calculation 0  Recall 2  Recall-comments pt was unable to recall 1 of 3 words  Language- name 2 objects 0  Language- repeat 1  Language- follow 3 step command 3  Language- read & follow direction 0  Write a sentence 0  Copy design 0  Total score 19       PLEASE NOTE: A Mini-Cog screen was completed. Maximum score is 20. A value of 0 denotes this part of Folstein MMSE was not completed or the patient failed this part of the Mini-Cog  screening.   Mini-Cog Screening Orientation to Time - Max 5 pts Orientation to Place - Max 5 pts Registration - Max 3 pts Recall - Max 3 pts Language Repeat - Max 1 pts Language Follow 3 Step Command - Max 3 pts   Immunization History  Administered Date(s) Administered  .  Influenza Split 01/31/2012  . Influenza Whole 02/01/2003, 02/11/2007, 01/02/2009, 01/15/2010  . Influenza,inj,Quad PF,36+ Mos 12/22/2012, 01/24/2015  . Influenza-Unspecified 01/21/2014  . Pneumococcal Conjugate-13 09/29/2014  . Pneumococcal Polysaccharide-23 06/25/2005  . Td 01/21/2003, 01/31/2012  . Zoster 08/17/2011   Screening Tests Health Maintenance  Topic Date Due  . FOOT EXAM  03/11/2017 (Originally 06/23/2014)  . OPHTHALMOLOGY EXAM  03/11/2017 (Originally 06/09/2013)  . MAMMOGRAM  05/24/2020 (Originally 03/05/2013)  . HEMOGLOBIN A1C  09/08/2016  . TETANUS/TDAP  01/30/2022  . INFLUENZA VACCINE  Addressed  . DEXA SCAN  Completed  . ZOSTAVAX  Completed  . PNA vac Low Risk Adult  Completed      Plan:     I have personally reviewed and addressed the Medicare Annual Wellness questionnaire and have noted the following in the patient's chart:  A. Medical and social history B. Use of alcohol, tobacco or illicit drugs  C. Current medications and supplements D. Functional ability and status E.  Nutritional status F.  Physical activity G. Advance directives H. List of other physicians I.  Hospitalizations, surgeries, and ER visits in previous 12 months J.  [place] to include hearing, vision, cognitive, depression L. Referrals and appointments - none  In addition, I have reviewed and discussed with patient certain preventive protocols, quality metrics, and best practice recommendations. A written personalized care plan for preventive services as well as general preventive health recommendations were provided to patient.  See attached scanned questionnaire for additional information.    Signed,   Lindell Noe, MHA, BS, LPN Health Coach

## 2016-03-11 NOTE — Patient Instructions (Addendum)
[name] , Thank you for taking time to come for your Medicare Wellness Visit. I appreciate your ongoing commitment to your health goals. Please review the following plan we discussed and let me know if I can assist you in the future.   These are the goals we discussed: Goals    . Increase physical activity          Starting 03/11/2016, I will continue to exercise for at least 60 min daily.        This is a list of the screening recommended for you and due dates:  Health Maintenance  Topic Date Due  . Complete foot exam   03/11/2017*  . Eye exam for diabetics  03/11/2017*  . Mammogram  05/24/2020*  . Hemoglobin A1C  09/08/2016  . Tetanus Vaccine  01/30/2022  . Flu Shot  Addressed  . DEXA scan (bone density measurement)  Completed  . Shingles Vaccine  Completed  . Pneumonia vaccines  Completed  *Topic was postponed. The date shown is not the original due date.   Preventive Care for Adults  A healthy lifestyle and preventive care can promote health and wellness. Preventive health guidelines for adults include the following key practices.  . A routine yearly physical is a good way to check with your health care provider about your health and preventive screening. It is a chance to share any concerns and updates on your health and to receive a thorough exam.  . Visit your dentist for a routine exam and preventive care every 6 months. Brush your teeth twice a day and floss once a day. Good oral hygiene prevents tooth decay and gum disease.  . The frequency of eye exams is based on your age, health, family medical history, use  of contact lenses, and other factors. Follow your health care provider's ecommendations for frequency of eye exams.  . Eat a healthy diet. Foods like vegetables, fruits, whole grains, low-fat dairy products, and lean protein foods contain the nutrients you need without too many calories. Decrease your intake of foods high in solid fats, added sugars, and salt.  Eat the right amount of calories for you. Get information about a proper diet from your health care provider, if necessary.  . Regular physical exercise is one of the most important things you can do for your health. Most adults should get at least 150 minutes of moderate-intensity exercise (any activity that increases your heart rate and causes you to sweat) each week. In addition, most adults need muscle-strengthening exercises on 2 or more days a week.  Silver Sneakers may be a benefit available to you. To determine eligibility, you may visit the website: www.silversneakers.com or contact program at [phone] Mon-Fri between 8AM-8PM.   . Maintain a healthy weight. The body mass index (BMI) is a screening tool to identify possible weight problems. It provides an estimate of body fat based on height and weight. Your health care provider can find your BMI and can help you achieve or maintain a healthy weight.   For adults 20 years and older: ? A BMI below 18.5 is considered underweight. ? A BMI of 18.5 to 24.9 is normal. ? A BMI of 25 to 29.9 is considered overweight. ? A BMI of 30 and above is considered obese.   . Maintain normal blood lipids and cholesterol levels by exercising and minimizing your intake of saturated fat. Eat a balanced diet with plenty of fruit and vegetables. Blood tests for lipids and cholesterol should  begin at age 87 and be repeated every 5 years. If your lipid or cholesterol levels are high, you are over 50, or you are at high risk for heart disease, you may need your cholesterol levels checked more frequently. Ongoing high lipid and cholesterol levels should be treated with medicines if diet and exercise are not working.  . If you smoke, find out from your health care provider how to quit. If you do not use tobacco, please do not start.  . If you choose to drink alcohol, please do not consume more than 2 drinks per day. One drink is considered to be 12 ounces (355  mL) of beer, 5 ounces (148 mL) of wine, or 1.5 ounces (44 mL) of liquor.  . If you are 67-20 years old, ask your health care provider if you should take aspirin to prevent strokes.  . Use sunscreen. Apply sunscreen liberally and repeatedly throughout the day. You should seek shade when your shadow is shorter than you. Protect yourself by wearing long sleeves, pants, a wide-brimmed hat, and sunglasses year round, whenever you are outdoors.  . Once a month, do a whole body skin exam, using a mirror to look at the skin on your back. Tell your health care provider of new moles, moles that have irregular borders, moles that are larger than a pencil eraser, or moles that have changed in shape or color.

## 2016-03-11 NOTE — Progress Notes (Signed)
PCP notes:   Health maintenance:  Foot exam - if necessary, PCP will perform at next appt Eye exam - per pt, she will schedule future appt A1C - completed Flu vaccine - per pt, she already had vaccine at pharmacy  Abnormal screenings:   Hearing - failed Fall risk - hx of multiple falls without injury Mini-Cog score: 19/20  Patient concerns:   Pt has increasing concerns with dry mouth and throat irritation when coughing. Pt also wants to discuss resuming B-12 injections.   Nurse concerns:  None  Next PCP appt:   03/20/16 @   I reviewed health advisor's note, was available for consultation, and agree with documentation and plan. Roxy MannsMarne Tower MD

## 2016-03-16 ENCOUNTER — Encounter: Payer: Self-pay | Admitting: Emergency Medicine

## 2016-03-16 ENCOUNTER — Emergency Department: Payer: Medicare Other

## 2016-03-16 ENCOUNTER — Inpatient Hospital Stay
Admission: EM | Admit: 2016-03-16 | Discharge: 2016-03-21 | DRG: 871 | Disposition: A | Discharge status: Home / Self Care | Payer: Medicare Other | Attending: Internal Medicine | Admitting: Internal Medicine

## 2016-03-16 DIAGNOSIS — M790 Rheumatism, unspecified: Secondary | ICD-10-CM | POA: Diagnosis present

## 2016-03-16 DIAGNOSIS — F329 Major depressive disorder, single episode, unspecified: Secondary | ICD-10-CM | POA: Diagnosis present

## 2016-03-16 DIAGNOSIS — I10 Essential (primary) hypertension: Secondary | ICD-10-CM | POA: Diagnosis not present

## 2016-03-16 DIAGNOSIS — G8929 Other chronic pain: Secondary | ICD-10-CM | POA: Diagnosis present

## 2016-03-16 DIAGNOSIS — E1122 Type 2 diabetes mellitus with diabetic chronic kidney disease: Secondary | ICD-10-CM | POA: Diagnosis present

## 2016-03-16 DIAGNOSIS — J9601 Acute respiratory failure with hypoxia: Secondary | ICD-10-CM | POA: Diagnosis present

## 2016-03-16 DIAGNOSIS — R05 Cough: Secondary | ICD-10-CM | POA: Diagnosis not present

## 2016-03-16 DIAGNOSIS — I2510 Atherosclerotic heart disease of native coronary artery without angina pectoris: Secondary | ICD-10-CM | POA: Diagnosis present

## 2016-03-16 DIAGNOSIS — Z880 Allergy status to penicillin: Secondary | ICD-10-CM

## 2016-03-16 DIAGNOSIS — M810 Age-related osteoporosis without current pathological fracture: Secondary | ICD-10-CM | POA: Diagnosis not present

## 2016-03-16 DIAGNOSIS — Z7982 Long term (current) use of aspirin: Secondary | ICD-10-CM

## 2016-03-16 DIAGNOSIS — A419 Sepsis, unspecified organism: Secondary | ICD-10-CM | POA: Diagnosis not present

## 2016-03-16 DIAGNOSIS — I130 Hypertensive heart and chronic kidney disease with heart failure and stage 1 through stage 4 chronic kidney disease, or unspecified chronic kidney disease: Secondary | ICD-10-CM | POA: Diagnosis present

## 2016-03-16 DIAGNOSIS — I5021 Acute systolic (congestive) heart failure: Secondary | ICD-10-CM | POA: Diagnosis not present

## 2016-03-16 DIAGNOSIS — J45901 Unspecified asthma with (acute) exacerbation: Secondary | ICD-10-CM | POA: Diagnosis present

## 2016-03-16 DIAGNOSIS — Z886 Allergy status to analgesic agent status: Secondary | ICD-10-CM

## 2016-03-16 DIAGNOSIS — J189 Pneumonia, unspecified organism: Secondary | ICD-10-CM | POA: Diagnosis present

## 2016-03-16 DIAGNOSIS — I509 Heart failure, unspecified: Secondary | ICD-10-CM | POA: Diagnosis not present

## 2016-03-16 DIAGNOSIS — Z794 Long term (current) use of insulin: Secondary | ICD-10-CM

## 2016-03-16 DIAGNOSIS — Z9889 Other specified postprocedural states: Secondary | ICD-10-CM

## 2016-03-16 DIAGNOSIS — D649 Anemia, unspecified: Secondary | ICD-10-CM | POA: Diagnosis present

## 2016-03-16 DIAGNOSIS — I429 Cardiomyopathy, unspecified: Secondary | ICD-10-CM | POA: Diagnosis present

## 2016-03-16 DIAGNOSIS — M1990 Unspecified osteoarthritis, unspecified site: Secondary | ICD-10-CM | POA: Diagnosis not present

## 2016-03-16 DIAGNOSIS — Z7984 Long term (current) use of oral hypoglycemic drugs: Secondary | ICD-10-CM | POA: Diagnosis not present

## 2016-03-16 DIAGNOSIS — E785 Hyperlipidemia, unspecified: Secondary | ICD-10-CM | POA: Diagnosis not present

## 2016-03-16 DIAGNOSIS — K219 Gastro-esophageal reflux disease without esophagitis: Secondary | ICD-10-CM | POA: Diagnosis present

## 2016-03-16 DIAGNOSIS — M419 Scoliosis, unspecified: Secondary | ICD-10-CM | POA: Diagnosis not present

## 2016-03-16 DIAGNOSIS — N179 Acute kidney failure, unspecified: Secondary | ICD-10-CM | POA: Diagnosis not present

## 2016-03-16 DIAGNOSIS — I272 Pulmonary hypertension, unspecified: Secondary | ICD-10-CM | POA: Diagnosis not present

## 2016-03-16 DIAGNOSIS — E119 Type 2 diabetes mellitus without complications: Secondary | ICD-10-CM | POA: Diagnosis not present

## 2016-03-16 DIAGNOSIS — J9690 Respiratory failure, unspecified, unspecified whether with hypoxia or hypercapnia: Secondary | ICD-10-CM

## 2016-03-16 DIAGNOSIS — J90 Pleural effusion, not elsewhere classified: Secondary | ICD-10-CM | POA: Diagnosis not present

## 2016-03-16 DIAGNOSIS — I340 Nonrheumatic mitral (valve) insufficiency: Secondary | ICD-10-CM | POA: Diagnosis present

## 2016-03-16 DIAGNOSIS — Z79899 Other long term (current) drug therapy: Secondary | ICD-10-CM

## 2016-03-16 DIAGNOSIS — I5023 Acute on chronic systolic (congestive) heart failure: Secondary | ICD-10-CM | POA: Diagnosis present

## 2016-03-16 DIAGNOSIS — J9621 Acute and chronic respiratory failure with hypoxia: Secondary | ICD-10-CM | POA: Diagnosis not present

## 2016-03-16 DIAGNOSIS — N189 Chronic kidney disease, unspecified: Secondary | ICD-10-CM | POA: Diagnosis not present

## 2016-03-16 DIAGNOSIS — E876 Hypokalemia: Secondary | ICD-10-CM | POA: Diagnosis not present

## 2016-03-16 DIAGNOSIS — R0602 Shortness of breath: Secondary | ICD-10-CM

## 2016-03-16 DIAGNOSIS — Z8249 Family history of ischemic heart disease and other diseases of the circulatory system: Secondary | ICD-10-CM

## 2016-03-16 DIAGNOSIS — M549 Dorsalgia, unspecified: Secondary | ICD-10-CM | POA: Diagnosis present

## 2016-03-16 DIAGNOSIS — Z888 Allergy status to other drugs, medicaments and biological substances status: Secondary | ICD-10-CM

## 2016-03-16 DIAGNOSIS — Z79891 Long term (current) use of opiate analgesic: Secondary | ICD-10-CM

## 2016-03-16 LAB — TROPONIN I

## 2016-03-16 LAB — COMPREHENSIVE METABOLIC PANEL
ALBUMIN: 3.8 g/dL (ref 3.5–5.0)
ALT: 26 U/L (ref 14–54)
ANION GAP: 9 (ref 5–15)
AST: 39 U/L (ref 15–41)
Alkaline Phosphatase: 49 U/L (ref 38–126)
BUN: 11 mg/dL (ref 6–20)
CHLORIDE: 105 mmol/L (ref 101–111)
CO2: 22 mmol/L (ref 22–32)
Calcium: 9.6 mg/dL (ref 8.9–10.3)
Creatinine, Ser: 1.35 mg/dL — ABNORMAL HIGH (ref 0.44–1.00)
GFR calc Af Amer: 42 mL/min — ABNORMAL LOW (ref >60)
GFR, EST NON AFRICAN AMERICAN: 37 mL/min — AB (ref >60)
GLUCOSE: 197 mg/dL — AB (ref 65–99)
POTASSIUM: 4 mmol/L (ref 3.5–5.1)
Sodium: 136 mmol/L (ref 135–145)
TOTAL PROTEIN: 7.3 g/dL (ref 6.5–8.1)
Total Bilirubin: 0.5 mg/dL (ref 0.3–1.2)

## 2016-03-16 LAB — CBC
HCT: 32.6 % — ABNORMAL LOW (ref 35.0–47.0)
Hemoglobin: 11.2 g/dL — ABNORMAL LOW (ref 12.0–16.0)
MCH: 30.7 pg (ref 26.0–34.0)
MCHC: 34.4 g/dL (ref 32.0–36.0)
MCV: 89.1 fL (ref 80.0–100.0)
PLATELETS: 244 10*3/uL (ref 150–440)
RBC: 3.66 MIL/uL — ABNORMAL LOW (ref 3.80–5.20)
RDW: 14.4 % (ref 11.5–14.5)
WBC: 8.2 10*3/uL (ref 3.6–11.0)

## 2016-03-16 LAB — GLUCOSE, CAPILLARY: GLUCOSE-CAPILLARY: 163 mg/dL — AB (ref 65–99)

## 2016-03-16 MED ORDER — SENNOSIDES-DOCUSATE SODIUM 8.6-50 MG PO TABS: 1.0000 | ORAL_TABLET | Freq: Every evening | ORAL | Status: DC | PRN

## 2016-03-16 MED ORDER — INSULIN ASPART 100 UNIT/ML ~~LOC~~ SOLN
0.0000 [IU] | Freq: Every day | SUBCUTANEOUS | Status: DC
  Administered 2016-03-18 – 2016-03-19 (×2): 2 [IU] via SUBCUTANEOUS
  Administered 2016-03-20: 3 [IU] via SUBCUTANEOUS
  Filled 2016-03-16: qty 3
  Filled 2016-03-16 (×2): qty 2

## 2016-03-16 MED ORDER — ONDANSETRON HCL 4 MG PO TABS: 4.0000 mg | ORAL_TABLET | Freq: Four times a day (QID) | ORAL | Status: DC | PRN

## 2016-03-16 MED ORDER — DEXTROMETHORPHAN POLISTIREX ER 30 MG/5ML PO SUER
30.0000 mg | Freq: Two times a day (BID) | ORAL | Status: DC
  Administered 2016-03-17 – 2016-03-21 (×7): 30 mg via ORAL
  Filled 2016-03-16 (×11): qty 5

## 2016-03-16 MED ORDER — DM-GUAIFENESIN ER 30-600 MG PO TB12: 1.0000 | ORAL_TABLET | Freq: Two times a day (BID) | ORAL | Status: DC

## 2016-03-16 MED ORDER — GUAIFENESIN ER 600 MG PO TB12
600.0000 mg | ORAL_TABLET | Freq: Two times a day (BID) | ORAL | Status: DC
  Administered 2016-03-17 – 2016-03-21 (×8): 600 mg via ORAL
  Filled 2016-03-16 (×8): qty 1

## 2016-03-16 MED ORDER — INSULIN ASPART 100 UNIT/ML ~~LOC~~ SOLN
0.0000 [IU] | Freq: Three times a day (TID) | SUBCUTANEOUS | Status: DC
  Administered 2016-03-17: 2 [IU] via SUBCUTANEOUS
  Administered 2016-03-17 – 2016-03-18 (×2): 8 [IU] via SUBCUTANEOUS
  Administered 2016-03-19: 5 [IU] via SUBCUTANEOUS
  Administered 2016-03-19: 3 [IU] via SUBCUTANEOUS
  Administered 2016-03-20: 8 [IU] via SUBCUTANEOUS
  Administered 2016-03-20 – 2016-03-21 (×2): 3 [IU] via SUBCUTANEOUS
  Administered 2016-03-21: 15 [IU] via SUBCUTANEOUS
  Filled 2016-03-16: qty 3
  Filled 2016-03-16 (×2): qty 8
  Filled 2016-03-16: qty 15
  Filled 2016-03-16: qty 5
  Filled 2016-03-16: qty 3
  Filled 2016-03-16: qty 2
  Filled 2016-03-16: qty 8
  Filled 2016-03-16: qty 3

## 2016-03-16 MED ORDER — IPRATROPIUM-ALBUTEROL 0.5-2.5 (3) MG/3ML IN SOLN
3.0000 mL | Freq: Four times a day (QID) | RESPIRATORY_TRACT | Status: DC | PRN
  Administered 2016-03-17 (×2): 3 mL via RESPIRATORY_TRACT
  Filled 2016-03-16 (×2): qty 3

## 2016-03-16 MED ORDER — IPRATROPIUM-ALBUTEROL 0.5-2.5 (3) MG/3ML IN SOLN
3.0000 mL | Freq: Once | RESPIRATORY_TRACT | Status: AC
  Administered 2016-03-16: 3 mL via RESPIRATORY_TRACT
  Filled 2016-03-16: qty 3

## 2016-03-16 MED ORDER — DEXTROSE 5 % IV SOLN
500.0000 mg | Freq: Once | INTRAVENOUS | Status: AC
  Administered 2016-03-16: 500 mg via INTRAVENOUS
  Filled 2016-03-16: qty 500

## 2016-03-16 MED ORDER — DEXTROSE 5 % IV SOLN
500.0000 mg | INTRAVENOUS | Status: DC
  Filled 2016-03-16: qty 500

## 2016-03-16 MED ORDER — ONDANSETRON HCL 4 MG/2ML IJ SOLN
4.0000 mg | Freq: Four times a day (QID) | INTRAMUSCULAR | Status: DC | PRN
  Filled 2016-03-16: qty 2

## 2016-03-16 MED ORDER — CEFTRIAXONE SODIUM-DEXTROSE 1-3.74 GM-% IV SOLR
INTRAVENOUS | Status: AC
  Filled 2016-03-16: qty 50

## 2016-03-16 MED ORDER — CEFTRIAXONE SODIUM-DEXTROSE 1-3.74 GM-% IV SOLR
1.0000 g | INTRAVENOUS | Status: DC
  Administered 2016-03-17 – 2016-03-18 (×2): 1 g via INTRAVENOUS
  Filled 2016-03-16 (×4): qty 50

## 2016-03-16 MED ORDER — BISACODYL 5 MG PO TBEC: 5.0000 mg | DELAYED_RELEASE_TABLET | Freq: Every day | ORAL | Status: DC | PRN

## 2016-03-16 MED ORDER — ENOXAPARIN SODIUM 30 MG/0.3ML ~~LOC~~ SOLN
30.0000 mg | Freq: Every day | SUBCUTANEOUS | Status: DC
  Administered 2016-03-17: 30 mg via SUBCUTANEOUS
  Filled 2016-03-16: qty 0.3

## 2016-03-16 MED ORDER — CEFTRIAXONE SODIUM-DEXTROSE 1-3.74 GM-% IV SOLR
1.0000 g | Freq: Once | INTRAVENOUS | Status: AC
  Administered 2016-03-16: 1 g via INTRAVENOUS
  Filled 2016-03-16: qty 50

## 2016-03-16 MED ORDER — MAGNESIUM CITRATE PO SOLN: 1.0000 | Freq: Once | ORAL | Status: DC | PRN

## 2016-03-16 MED ORDER — ZOLPIDEM TARTRATE 5 MG PO TABS
5.0000 mg | ORAL_TABLET | Freq: Every evening | ORAL | Status: DC | PRN
  Administered 2016-03-18 – 2016-03-20 (×3): 5 mg via ORAL
  Filled 2016-03-16 (×3): qty 1

## 2016-03-16 MED ORDER — DEXTROSE 5 % IV SOLN: 1.0000 g | Freq: Once | INTRAVENOUS | Status: DC

## 2016-03-16 MED ORDER — SODIUM CHLORIDE 0.9 % IV SOLN
INTRAVENOUS | Status: DC
  Administered 2016-03-17 (×2): via INTRAVENOUS

## 2016-03-16 NOTE — Progress Notes (Signed)
Pharmacy Antibiotic Note  [name] is a 80 y.o. female admitted on 03/16/2016 with CAP.  Pharmacy has been consulted for ceftriaxone dosing.  Plan: Ceftriaxone 1 gm IV Q24H  Height: 4\' 11"  (149.9 cm) Weight: 140 lb (63.5 kg) IBW/kg (Calculated) : 43.2  Temp (24hrs), Avg:98.5 F (36.9 C), Min:98.4 F (36.9 C), Max:98.6 F (37 C)   Recent Labs Lab 03/11/16 1109 03/16/16 1842  WBC 7.5 8.2  CREATININE 1.26* 1.35*    Estimated Creatinine Clearance: 27.8 mL/min (by C-G formula based on SCr of 1.35 mg/dL (H)).    Allergies  Allergen Reactions  . Acetaminophen     REACTION: nausea  . Codeine     REACTION: itching  . Fluticasone Propionate     REACTION: palpitations  . Fosamax [Alendronate Sodium]     Difficulty swallowing and fever/ joint pain   . Naproxen     REACTION: GI  . Nsaids     REACTION: elevated LFT's  . Penicillins     REACTION: swelling at inj site   Thank you for allowing pharmacy to be a part of this patient's care.  [name], Pharm.D., BCPS Clinical Pharmacist 03/16/2016 11:12 PM

## 2016-03-16 NOTE — ED Notes (Signed)
Patient's family member out of room to ask "is somebody gonna see her?" Informed patient that the patient had already been seen, evaluated and treated and was getting ready to go to her inpatient floor bed. In to room to see patient and see if there was something she needed at this time. States she is fine and is just wondering what is going on. Again informed the patient and the family members of progress so far.

## 2016-03-16 NOTE — ED Triage Notes (Signed)
02 2l applied.

## 2016-03-16 NOTE — H&P (Signed)
[place] @ [facility] Admission History and Physical [name], D.O.  ---------------------------------------------------------------------------------------------------------------------   PATIENT NAME: [name] MR#: [id number] DATE OF BIRTH: [birth year] DATE OF ADMISSION: 03/16/2016 PRIMARY CARE PHYSICIAN: Loura Pardon, MD  REQUESTING/REFERRING PHYSICIAN: ED Dr. Corky Downs  CHIEF COMPLAINT: Chief Complaint  Patient presents with  . Cough    HISTORY OF PRESENT ILLNESS: [name] is a 80 y.o. female with a known history of HTN, HLD DM, OA  presents to the emergency department complaining of cough and shortness of breath.  Patient was in a usual state of health until About 2 weeks ago when she developed a chest cold as she describes it with chest congestion, nonproductive cough, 3 days of fever. She states that for the past 3 days she has felt well enough to go out of the house. She did not seek medical attention for her initial illness, did not take any medications for it. This afternoon while she was out she describes sudden onset of shortness of breath with severe cough which is nonproductive. She reports chest tightness secondary to coughing..  Patient reports a history of bronchial asthma but denies any history of COPD.  Otherwise there has been no change in status. Patient has been taking medication as prescribed and there has been no recent change in medication or diet.  There has been no recent illness, travel or sick contacts.    Patient denies fevers/chills (currently), weakness, dizziness, chest pain, , N/V/C/D, abdominal pain, dysuria/frequency, changes in mental status.   EMS/ED COURSE:   Patient received duo nebs, steroids, Rocephin and Zithromax.  PAST MEDICAL HISTORY: Past Medical History:  Diagnosis Date  . Asthma    childhood  . Depression   . Diabetes mellitus    Type II  . Foot fracture, right 3/13  . H/O scoliosis   . HLD  (hyperlipidemia)   . HTN (hypertension)   . Osteoarthritis    hands,elbow,knees  . Osteoporosis   . Rhinitis, allergic   . Scoliosis    chronic back pain      PAST SURGICAL HISTORY: Past Surgical History:  Procedure Laterality Date  . bladder tack    . TUBAL LIGATION     bilateral      SOCIAL HISTORY: Social History  Substance Use Topics  . Smoking status: Never Smoker  . Smokeless tobacco: Never Used  . Alcohol use No      FAMILY HISTORY: Family History  Problem Relation Age of Onset  . Coronary artery disease Mother   . Cancer Mother     thyroid  . Heart attack Mother   . Coronary artery disease Father   . Heart attack Father   . Multiple sclerosis Sister      MEDICATIONS AT HOME: Prior to Admission medications   Medication Sig Start Date End Date Taking? Authorizing Provider  albuterol (PROVENTIL HFA;VENTOLIN HFA) 108 (90 BASE) MCG/ACT inhaler Inhale 2 puffs into the lungs every 4 (four) hours as needed for wheezing. 06/22/13   Abner Greenspan, MD  amitriptyline (ELAVIL) 25 MG tablet Take 1 tablet (25 mg total) by mouth at bedtime. 01/23/16   Abner Greenspan, MD  amLODipine (NORVASC) 10 MG tablet Take 1 tablet (10 mg total) by mouth daily. * Needs an appointment with Dr for additional refills* Patient not taking: Reported on 03/11/2016 07/10/15   Abner Greenspan, MD  aspirin 81 MG tablet Take 81 mg by mouth daily.      Historical Provider, MD  atorvastatin (LIPITOR) 40 MG tablet TAKE 1 TABLET BY MOUTH DAILY 03/22/15   Thayer Headings, MD  Blood Glucose Monitoring Suppl (ONETOUCH VERIO FLEX SYSTEM) W/DEVICE KIT 1 Device by Other route daily. Check blood sugar once daily and as directed. Dx E11.9 03/08/15   Abner Greenspan, MD  Calcium Carbonate-Vitamin D (CALCIUM 600+D) 600-400 MG-UNIT per tablet Take 2 tablets by mouth daily.      Historical Provider, MD  cyanocobalamin (,VITAMIN B-12,) 1000 MCG/ML injection Inject 1 mL (1,000 mcg total) into the muscle every 6 (six)  weeks. 02/20/15   Abner Greenspan, MD  fish oil-omega-3 fatty acids 1000 MG capsule Take 1 capsule by mouth 2 (two) times daily.     Historical Provider, MD  fluticasone (FLONASE) 50 MCG/ACT nasal spray Place 2 sprays into the nose daily. 10/19/12   Abner Greenspan, MD  glipiZIDE (GLUCOTROL XL) 5 MG 24 hr tablet Take 1 tablet (5 mg total) by mouth daily. 11/27/15   Abner Greenspan, MD  Ibuprofen-Diphenhydramine Cit (ADVIL PM PO) Take by mouth as needed.     Historical Provider, MD  meclizine (ANTIVERT) 25 MG tablet Take 25 mg by mouth as needed.    Historical Provider, MD  metFORMIN (GLUCOPHAGE) 500 MG tablet TAKE 2 TABLETS BY MOUTH EVERY MORNING, 1 TAB AT LUNCH AND 1 TAB AT SUPPER 03/04/16   Abner Greenspan, MD  metoprolol tartrate (LOPRESSOR) 25 MG tablet TAKE 1 TABLET (25 MG TOTAL) BY MOUTH 2 (TWO) TIMES DAILY. 09/01/15   Thayer Headings, MD  Multiple Vitamin (MULTIVITAMIN) tablet Take 1 tablet by mouth daily.      Historical Provider, MD  omeprazole (PRILOSEC) 20 MG capsule Take 20 mg by mouth daily.    Historical Provider, MD  ONE TOUCH LANCETS MISC Check blood sugar once daily and as directed. Dx E11.9 03/08/15   Abner Greenspan, MD  ONETOUCH VERIO test strip CHECK BLOOD SUGAR ONCE DAILY AND AS DIRECTED. DX E11.9 02/23/16   Abner Greenspan, MD  quinapril (ACCUPRIL) 40 MG tablet TAKE 1 TABLET AT BEDTIME 09/28/15   Abner Greenspan, MD      DRUG ALLERGIES: Allergies  Allergen Reactions  . Acetaminophen     REACTION: nausea  . Codeine     REACTION: itching  . Fluticasone Propionate     REACTION: palpitations  . Fosamax [Alendronate Sodium]     Difficulty swallowing and fever/ joint pain   . Naproxen     REACTION: GI  . Nsaids     REACTION: elevated LFT's  . Penicillins     REACTION: swelling at inj site     REVIEW OF SYSTEMS: CONSTITUTIONAL: No fatigue, weakness, fever, chills, weight gain/loss, headache EYES: No blurry or double vision. ENT: No tinnitus, postnasal drip, redness or soreness of  the oropharynx. RESPIRATORY: Positive dyspnea, cough, wheeze, negative hemoptysis. CARDIOVASCULAR: No chest pain, orthopnea, palpitations, syncope. GASTROINTESTINAL: No nausea, vomiting, constipation, diarrhea, abdominal pain. No hematemesis, melena or hematochezia. GENITOURINARY: No dysuria, frequency, hematuria. ENDOCRINE: No polyuria or nocturia. No heat or cold intolerance. HEMATOLOGY: No anemia, bruising, bleeding. INTEGUMENTARY: No rashes, ulcers, lesions. MUSCULOSKELETAL: No pain, arthritis, swelling, gout. NEUROLOGIC: No numbness, tingling, weakness or ataxia. No seizure-type activity. PSYCHIATRIC: No anxiety, depression, insomnia.  PHYSICAL EXAMINATION: VITAL SIGNS: Blood pressure 137/74, pulse (!) 101, temperature 98.6 F (37 C), temperature source Oral, resp. rate 16, height '4\' 11"'$  (1.499 m), weight 63.5 kg (140 lb), last menstrual period 04/22/1985, SpO2 98 %.  GENERAL: 80  y.o.-year-old white female patient, well-developed, well-nourished lying in the bed in no acute distress.  Pleasant and cooperative.  Fatigued. HEENT: Head atraumatic, normocephalic. Pupils equal, round, reactive to light and accommodation. No scleral icterus. Extraocular muscles intact. Oropharynx is clear. Mucus membranes moist. NECK: Supple, full range of motion. No JVD, no bruit heard. No cervical lymphadenopathy. CHEST: Normal breath sounds bilaterally. No wheezing, rales, rhonchi or crackles. No use of accessory muscles of respiration.  No reproducible chest wall tenderness.  CARDIOVASCULAR: S1, S2 normal. No murmurs, rubs, or gallops appreciated. Cap refill <2 seconds. ABDOMEN: Soft, nontender, nondistended. No rebound, guarding, rigidity. Normoactive bowel sounds present in all four quadrants. No organomegaly or mass. EXTREMITIES:. No pedal edema, cyanosis, or clubbing. NEUROLOGIC: Cranial nerves II through XII are grossly intact with no focal sensorimotor deficit.  PSYCHIATRIC: The patient is alert and  oriented x 3. Normal affect, mood, thought content. SKIN: Warm, dry, and intact without obvious rash, lesion, or ulcer.  LABORATORY PANEL:  CBC  Recent Labs Lab 03/16/16 1842  WBC 8.2  HGB 11.2*  HCT 32.6*  PLT 244   ----------------------------------------------------------------------------------------------------------------- Chemistries  Recent Labs Lab 03/16/16 1842  NA 136  K 4.0  CL 105  CO2 22  GLUCOSE 197*  BUN 11  CREATININE 1.35*  CALCIUM 9.6  AST 39  ALT 26  ALKPHOS 49  BILITOT 0.5   ------------------------------------------------------------------------------------------------------------------ Cardiac Enzymes  Recent Labs Lab 03/16/16 1842  TROPONINI <0.03   ------------------------------------------------------------------------------------------------------------------  RADIOLOGY: Dg Chest 2 View  Result Date: 03/16/2016 CLINICAL DATA:  Patient with a virus for multiple days. Fever and cough. EXAM: CHEST  2 VIEW COMPARISON:  None. FINDINGS: Normal cardiac and mediastinal contours. Heterogeneous opacities within the left mid and lower lung and right lower lung. Small bilateral pleural effusions. No pneumothorax. Mid thoracic spine degenerative changes. IMPRESSION: Heterogeneous opacities within the left mid and lower lung as well as right lower lung which are nonspecific given lack of prior exams. In the acute setting these are concerning for infectious process. Followup PA and lateral chest X-ray is recommended in 3-4 weeks following trial of antibiotic therapy to ensure resolution and exclude underlying malignancy. Small bilateral pleural effusions. Electronically Signed   By: Annia Belt M.D.   On: 03/16/2016 19:26    EKG: Sinus tachycardia at 10 7 bpm with normal axis and nonspecific ST-T wave changes.   IMPRESSION AND PLAN:  This is a 80 y.o. female with a history of HTN, HLD DM, OA  now being admitted with: 1. Community-acquired  pneumonia- - Admit for IV antibiotics, IV fluids, nebulizers and O2 therapy as needed. - Check sputum cultures - Expectorants and antipyretics as needed. - Follow up chest x-ray was recommended in 3-4 weeks to ensure resolution and exclude underlying malignancy. 2. AKI on CKD- - IV fluids and recheck BMP in the a.m. 3. Anemia, chronic and stable. Check CBC in a.m. 4. History of diabetes-we'll hold oral hypoglycemics and cover with regular insulin sliding scale coverage. I glucose control. Continue Accupril 5. History of hypertension-continue Lopressor 6. History of hyperlipidemia-continue Lipitor 7. History of vertigo-continue Antivert 8. History of GERD-continue Prilosec Continue Elavil, aspirin, calcium, multivitamin, fish oil.  Diet/Nutrition: Heart healthy, carb controlled Fluids: IV normal saline DVT Px: Lovenox, SCDs and early ambulation Code Status: Full  All the records are reviewed and case discussed with ED provider. Management plans discussed with the patient and/or family who express understanding and agree with plan of care.   TOTAL TIME TAKING CARE OF THIS  PATIENT: 60 minutes.   Anthoni Geerts D.O. on 03/16/2016 at 9:51 PM Between 7am to 6pm - Pager - [phone] After 6pm go to [email or web address] - [employer] [facility] [place] [facility] Office [phone] CC: Primary care physician; Loura Pardon, MD     Note: This dictation was prepared with Dragon dictation along with smaller phrase technology. Any transcriptional errors that result from this process are unintentional.

## 2016-03-16 NOTE — ED Provider Notes (Signed)
[facility] Emergency Department Provider Note   ____________________________________________    I have reviewed the triage vital signs and the nursing notes.   HISTORY  Chief Complaint Cough     HPI [name] is a 80 y.o. female who presents with severe cough and shortness of breath. She reports she has had a terrible cough for she complains of wheezing and today she felt like she couldn't catch her breath. She denies recent travel. No calf pain or swelling. She reports fevers and chills several days ago but is improved. She reports chest tightness laterally. She does have a history of diabetes. No Sick contacts reported   Past Medical History:  Diagnosis Date  . Asthma    childhood  . Depression   . Diabetes mellitus    Type II  . Foot fracture, right 3/13  . H/O scoliosis   . HLD (hyperlipidemia)   . HTN (hypertension)   . Osteoarthritis    hands,elbow,knees  . Osteoporosis   . Rhinitis, allergic   . Scoliosis    chronic back pain    Patient Active Problem List   Diagnosis Date Noted  . HTN (hypertension) 03/15/2015  . Routine general medical examination at a health care facility 09/28/2014  . Anemia 09/28/2014  . Postmenopausal bone loss 09/15/2014  . Encounter for Medicare annual wellness exam 06/22/2013  . Colon cancer screening 06/22/2013  . Osteoporosis 03/02/2012  . Other screening mammogram 01/31/2012  . Post-menopausal 01/31/2012  . Vertigo 01/31/2012  . Tachycardia 09/11/2011  . Scoliosis   . ARM PAIN, RIGHT 03/29/2010  . ALLERGIC RHINITIS 08/12/2007  . ADVEF, DRUG/MEDICINAL/BIOLOGICAL SUBST NOS 08/20/2006  . HERPES SIMPLEX INFECTION 07/28/2006  . LYME DISEASE 07/28/2006  . Diabetes type 2, controlled ([place]) 07/28/2006  . Hyperlipidemia 07/28/2006  . DEPRESSION 07/28/2006  . Essential hypertension 07/28/2006  . ASTHMA 07/28/2006  . OVERACTIVE BLADDER 07/28/2006  . OSTEOARTHRITIS 07/28/2006  . [place] DISEASE 07/28/2006  . FIBROSITIS 07/28/2006  . SCOLIOSIS 07/28/2006  . LIVER FUNCTION TESTS, ABNORMAL 07/28/2006    Past Surgical History:  Procedure Laterality Date  . bladder tack    . TUBAL LIGATION     bilateral    Prior to Admission medications   Medication Sig Start Date End Date Taking? Authorizing Provider  albuterol (PROVENTIL HFA;VENTOLIN HFA) 108 (90 BASE) MCG/ACT inhaler Inhale 2 puffs into the lungs every 4 (four) hours as needed for wheezing. 06/22/13   Abner Greenspan, MD  amitriptyline (ELAVIL) 25 MG tablet Take 1 tablet (25 mg total) by mouth at bedtime. 01/23/16   Abner Greenspan, MD  amLODipine (NORVASC) 10 MG tablet Take 1 tablet (10 mg total) by mouth daily. * Needs an appointment with Dr for additional refills* Patient not taking: Reported on 03/11/2016 07/10/15   Abner Greenspan, MD  aspirin 81 MG tablet Take 81 mg by mouth daily.      Historical Provider, MD  atorvastatin (LIPITOR) 40 MG tablet TAKE 1 TABLET BY MOUTH DAILY 03/22/15   Thayer Headings, MD  Blood Glucose Monitoring Suppl (ONETOUCH VERIO FLEX SYSTEM) W/DEVICE KIT 1 Device by Other route daily. Check blood sugar once daily and as directed. Dx E11.9 03/08/15   Abner Greenspan, MD  Calcium Carbonate-Vitamin D (CALCIUM 600+D) 600-400 MG-UNIT per tablet Take 2 tablets by mouth daily.      Historical Provider, MD  cyanocobalamin (,VITAMIN B-12,) 1000 MCG/ML injection Inject 1 mL (1,000 mcg total) into the muscle  every 6 (six) weeks. 02/20/15   Abner Greenspan, MD  fish oil-omega-3 fatty acids 1000 MG capsule Take 1 capsule by mouth 2 (two) times daily.     Historical Provider, MD  fluticasone (FLONASE) 50 MCG/ACT nasal spray Place 2 sprays into the nose daily. 10/19/12   Abner Greenspan, MD  glipiZIDE (GLUCOTROL XL) 5 MG 24 hr tablet Take 1 tablet (5 mg total) by mouth daily. 11/27/15   Abner Greenspan, MD  Ibuprofen-Diphenhydramine Cit (ADVIL PM PO) Take by mouth as needed.     Historical Provider, MD  meclizine (ANTIVERT)  25 MG tablet Take 25 mg by mouth as needed.    Historical Provider, MD  metFORMIN (GLUCOPHAGE) 500 MG tablet TAKE 2 TABLETS BY MOUTH EVERY MORNING, 1 TAB AT LUNCH AND 1 TAB AT SUPPER 03/04/16   Abner Greenspan, MD  metoprolol tartrate (LOPRESSOR) 25 MG tablet TAKE 1 TABLET (25 MG TOTAL) BY MOUTH 2 (TWO) TIMES DAILY. 09/01/15   Thayer Headings, MD  Multiple Vitamin (MULTIVITAMIN) tablet Take 1 tablet by mouth daily.      Historical Provider, MD  omeprazole (PRILOSEC) 20 MG capsule Take 20 mg by mouth daily.    Historical Provider, MD  ONE TOUCH LANCETS MISC Check blood sugar once daily and as directed. Dx E11.9 03/08/15   Abner Greenspan, MD  ONETOUCH VERIO test strip CHECK BLOOD SUGAR ONCE DAILY AND AS DIRECTED. DX E11.9 02/23/16   Abner Greenspan, MD  quinapril (ACCUPRIL) 40 MG tablet TAKE 1 TABLET AT BEDTIME 09/28/15   Abner Greenspan, MD     Allergies Acetaminophen; Codeine; Fluticasone propionate; Fosamax [alendronate sodium]; Naproxen; Nsaids; and Penicillins  Family History  Problem Relation Age of Onset  . Coronary artery disease Mother   . Cancer Mother     thyroid  . Heart attack Mother   . Coronary artery disease Father   . Heart attack Father   . Multiple sclerosis Sister     Social History Social History  Substance Use Topics  . Smoking status: Never Smoker  . Smokeless tobacco: Never Used  . Alcohol use No    Review of Systems  Constitutional: No fever/chills Eyes: No visual changes.   Cardiovascular: As above Respiratory: Denies shortness of breath. Gastrointestinal: No abdominal pain.  No nausea, no vomiting.    Musculoskeletal: Negative for back pain.  Neurological: Negative for headaches or weakness  10-point ROS otherwise negative.  ____________________________________________   PHYSICAL EXAM:  VITAL SIGNS: ED Triage Vitals  Enc Vitals Group     BP 03/16/16 1826 (!) 154/78     Pulse Rate 03/16/16 1826 (!) 109     Resp 03/16/16 1826 20     Temp 03/16/16  1826 98.6 F (37 C)     Temp Source 03/16/16 1826 Oral     SpO2 03/16/16 1826 92 %     Weight 03/16/16 1828 140 lb (63.5 kg)     Height 03/16/16 1828 '4\' 11"'$  (1.499 m)     Head Circumference --      Peak Flow --      Pain Score --      Pain Loc --      Pain Edu? --      Excl. in [place]? --     Constitutional: Alert and oriented. No acute distress. Pleasant and interactive Eyes: Conjunctivae are normal.  Head: Atraumatic. Nose: No congestion/rhinnorhea. Mouth/Throat: Mucous membranes are moist.   Neck:  Painless ROM Cardiovascular: Normal  rate, regular rhythm. Grossly normal heart sounds.  Good peripheral circulation. Respiratory: Normal respiratory effort.  No retractions. Lungs CTAB. Gastrointestinal: Soft and nontender. No distention.  No CVA tenderness. Genitourinary: deferred Musculoskeletal: No lower extremity tenderness nor edema.  Warm and well perfused Neurologic:  Normal speech and language. No gross focal neurologic deficits are appreciated.  Skin:  Skin is warm, dry and intact. No rash noted. Psychiatric: Mood and affect are normal. Speech and behavior are normal.  ____________________________________________   LABS (all labs ordered are listed, but only abnormal results are displayed)  Labs Reviewed  CBC - Abnormal; Notable for the following:       Result Value   RBC 3.66 (*)    Hemoglobin 11.2 (*)    HCT 32.6 (*)    All other components within normal limits  COMPREHENSIVE METABOLIC PANEL - Abnormal; Notable for the following:    Glucose, Bld 197 (*)    Creatinine, Ser 1.35 (*)    GFR calc non Af Amer 37 (*)    GFR calc Af Amer 42 (*)    All other components within normal limits  CULTURE, BLOOD (ROUTINE X 2)  CULTURE, BLOOD (ROUTINE X 2)  TROPONIN I   ____________________________________________  EKG  ED ECG REPORT I, Lavonia Drafts, the attending physician, personally viewed and interpreted this ECG.  Date: 03/16/2016 EKG Time: 6:40 PM Rate:  107 Rhythm: Sinus tachycardia QRS Axis: Axis deviation Intervals: normal ST/T Wave abnormalities: Nonspecific Conduction Disturbances: none Significant changes from Prior but this was greater than a year ago  ____________________________________________  RADIOLOGY  Chest x-ray consistent with pneumonia ____________________________________________   PROCEDURES  Procedure(s) performed: No    Critical Care performed: No ____________________________________________   INITIAL IMPRESSION / ASSESSMENT AND PLAN / ED COURSE  Pertinent labs & imaging results that were available during my care of the patient were reviewed by me and considered in my medical decision making (see chart for details).  Age results with severe cough and chest tightness. She is hypoxic and requiring O2 in the emergency department. Her heart rate is elevated as well. Suspect pneumonia  Clinical Course    ____________________________________________   FINAL CLINICAL IMPRESSION(S) / ED DIAGNOSES  Final diagnoses:  Community acquired pneumonia, unspecified laterality      NEW MEDICATIONS STARTED DURING THIS VISIT:  New Prescriptions   No medications on file     Note:  This document was prepared using Dragon voice recognition software and may include unintentional dictation errors.    Lavonia Drafts, MD 03/16/16 [phone]

## 2016-03-16 NOTE — ED Triage Notes (Signed)
Cough and increasingly SOB x 5 days, moist cough noted in triage.

## 2016-03-16 NOTE — Progress Notes (Signed)
Enoxaparin modified to 30 mg subcutaneously once daily for CrCl less than 30 mL/min.  Less Woolsey A. [place], VermontPharm.D., BCPS Clinical Pharmacist 03/16/2016 2311

## 2016-03-16 NOTE — ED Notes (Signed)
Patient taken for chest xray.

## 2016-03-17 LAB — GLUCOSE, CAPILLARY
GLUCOSE-CAPILLARY: 147 mg/dL — AB (ref 65–99)
GLUCOSE-CAPILLARY: 144 mg/dL — AB (ref 65–99)
GLUCOSE-CAPILLARY: 103 mg/dL — AB (ref 65–99)
Glucose-Capillary: 252 mg/dL — ABNORMAL HIGH (ref 65–99)

## 2016-03-17 LAB — BASIC METABOLIC PANEL
Anion gap: 6 (ref 5–15)
BUN: 10 mg/dL (ref 6–20)
CALCIUM: 9.2 mg/dL (ref 8.9–10.3)
CO2: 23 mmol/L (ref 22–32)
CREATININE: 1.02 mg/dL — AB (ref 0.44–1.00)
Chloride: 110 mmol/L (ref 101–111)
GFR, EST AFRICAN AMERICAN: 59 mL/min — AB (ref >60)
GFR, EST NON AFRICAN AMERICAN: 51 mL/min — AB (ref >60)
Glucose, Bld: 163 mg/dL — ABNORMAL HIGH (ref 65–99)
Potassium: 4 mmol/L (ref 3.5–5.1)
SODIUM: 139 mmol/L (ref 135–145)

## 2016-03-17 LAB — CBC
HCT: 29.2 % — ABNORMAL LOW (ref 35.0–47.0)
Hemoglobin: 9.8 g/dL — ABNORMAL LOW (ref 12.0–16.0)
MCH: 30.5 pg (ref 26.0–34.0)
MCHC: 33.7 g/dL (ref 32.0–36.0)
MCV: 90.4 fL (ref 80.0–100.0)
PLATELETS: 184 10*3/uL (ref 150–440)
RBC: 3.23 MIL/uL — AB (ref 3.80–5.20)
RDW: 14.7 % — ABNORMAL HIGH (ref 11.5–14.5)
WBC: 7.6 10*3/uL (ref 3.6–11.0)

## 2016-03-17 MED ORDER — LORAZEPAM 2 MG/ML IJ SOLN
INTRAMUSCULAR | Status: AC
  Filled 2016-03-17: qty 1

## 2016-03-17 MED ORDER — CHLORHEXIDINE GLUCONATE 0.12 % MT SOLN
15.0000 mL | Freq: Two times a day (BID) | OROMUCOSAL | Status: DC
  Administered 2016-03-20 – 2016-03-21 (×3): 15 mL via OROMUCOSAL
  Filled 2016-03-17 (×3): qty 15

## 2016-03-17 MED ORDER — LORAZEPAM 2 MG/ML IJ SOLN
2.0000 mg | INTRAMUSCULAR | Status: DC | PRN
  Administered 2016-03-17: 2 mg via INTRAVENOUS

## 2016-03-17 MED ORDER — CALCIUM CARBONATE-VITAMIN D 500-200 MG-UNIT PO TABS
1.0000 | ORAL_TABLET | Freq: Every day | ORAL | Status: DC
  Administered 2016-03-17 – 2016-03-21 (×4): 1 via ORAL
  Filled 2016-03-17 (×4): qty 1

## 2016-03-17 MED ORDER — ORAL CARE MOUTH RINSE
15.0000 mL | Freq: Two times a day (BID) | OROMUCOSAL | Status: DC
  Administered 2016-03-18 (×2): 15 mL via OROMUCOSAL

## 2016-03-17 MED ORDER — ASPIRIN EC 81 MG PO TBEC
81.0000 mg | DELAYED_RELEASE_TABLET | Freq: Every day | ORAL | Status: DC
  Administered 2016-03-17 – 2016-03-18 (×2): 81 mg via ORAL
  Filled 2016-03-17 (×2): qty 1

## 2016-03-17 MED ORDER — METOPROLOL TARTRATE 25 MG PO TABS
25.0000 mg | ORAL_TABLET | Freq: Two times a day (BID) | ORAL | Status: DC
  Administered 2016-03-17 – 2016-03-21 (×9): 25 mg via ORAL
  Filled 2016-03-17 (×9): qty 1

## 2016-03-17 MED ORDER — QUINAPRIL HCL 10 MG PO TABS: 40.0000 mg | ORAL_TABLET | Freq: Every day | ORAL | Status: DC

## 2016-03-17 MED ORDER — AZITHROMYCIN 500 MG PO TABS
500.0000 mg | ORAL_TABLET | Freq: Every day | ORAL | Status: DC
  Administered 2016-03-18: 500 mg via ORAL
  Filled 2016-03-17 (×2): qty 1

## 2016-03-17 MED ORDER — ADULT MULTIVITAMIN W/MINERALS CH
1.0000 | ORAL_TABLET | Freq: Every day | ORAL | Status: DC
  Administered 2016-03-17 – 2016-03-21 (×4): 1 via ORAL
  Filled 2016-03-17 (×4): qty 1

## 2016-03-17 MED ORDER — ENOXAPARIN SODIUM 40 MG/0.4ML ~~LOC~~ SOLN
40.0000 mg | Freq: Every day | SUBCUTANEOUS | Status: DC
  Administered 2016-03-17 – 2016-03-19 (×3): 40 mg via SUBCUTANEOUS
  Filled 2016-03-17 (×3): qty 0.4

## 2016-03-17 MED ORDER — MECLIZINE HCL 25 MG PO TABS
25.0000 mg | ORAL_TABLET | Freq: Two times a day (BID) | ORAL | Status: DC | PRN
  Filled 2016-03-17: qty 1

## 2016-03-17 MED ORDER — LORAZEPAM 2 MG/ML IJ SOLN: 2.0000 mg | INTRAMUSCULAR | Status: DC | PRN

## 2016-03-17 MED ORDER — PANTOPRAZOLE SODIUM 40 MG PO TBEC
40.0000 mg | DELAYED_RELEASE_TABLET | Freq: Every day | ORAL | Status: DC
  Administered 2016-03-17 – 2016-03-19 (×3): 40 mg via ORAL
  Filled 2016-03-17 (×3): qty 1

## 2016-03-17 MED ORDER — ALPRAZOLAM 0.5 MG PO TABS
0.5000 mg | ORAL_TABLET | Freq: Three times a day (TID) | ORAL | Status: DC | PRN
  Administered 2016-03-17: 0.5 mg via ORAL
  Filled 2016-03-17: qty 1

## 2016-03-17 MED ORDER — AMITRIPTYLINE HCL 25 MG PO TABS
25.0000 mg | ORAL_TABLET | Freq: Every day | ORAL | Status: DC
  Administered 2016-03-17 – 2016-03-20 (×4): 25 mg via ORAL
  Filled 2016-03-17 (×5): qty 1

## 2016-03-17 MED ORDER — LISINOPRIL 20 MG PO TABS
40.0000 mg | ORAL_TABLET | Freq: Every day | ORAL | Status: DC
Start: 2016-03-17 — End: 2016-03-19
  Administered 2016-03-17 – 2016-03-18 (×2): 40 mg via ORAL
  Filled 2016-03-17 (×2): qty 2

## 2016-03-17 MED ORDER — OMEGA-3-ACID ETHYL ESTERS 1 G PO CAPS
1.0000 | ORAL_CAPSULE | Freq: Two times a day (BID) | ORAL | Status: DC
  Administered 2016-03-17 – 2016-03-21 (×8): 1 g via ORAL
  Filled 2016-03-17 (×8): qty 1

## 2016-03-17 NOTE — Progress Notes (Signed)
PHARMACIST - PHYSICIAN COMMUNICATION DR:   [place] CONCERNING: Antibiotic IV to Oral Route Change Policy  RECOMMENDATION: This patient is receiving azithromycin by the intravenous route.  Based on criteria approved by the Pharmacy and Therapeutics Committee, the antibiotic(s) is/are being converted to the equivalent oral dose form(s).   DESCRIPTION: These criteria include:  Patient being treated for a respiratory tract infection, urinary tract infection, cellulitis or clostridium difficile associated diarrhea if on metronidazole  The patient is not neutropenic and does not exhibit a GI malabsorption state  The patient is eating (either orally or via tube) and/or has been taking other orally administered medications for a least 24 hours  The patient is improving clinically and has a Tmax < 100.5  If you have questions about this conversion, please contact the Pharmacy Department   Cindi CarbonMary M Hykeem Ojeda, PharmD, BCPS 03/17/16 10:55 AM

## 2016-03-17 NOTE — Progress Notes (Signed)
Initial Nutrition Assessment  DOCUMENTATION CODES:   Not applicable  INTERVENTION:  1. Ordered patient Snacks for between meals   NUTRITION DIAGNOSIS:   Inadequate oral intake related to poor appetite as evidenced by per patient/family report.  GOAL:   Patient will meet greater than or equal to 90% of their needs  MONITOR:   PO intake, I & O's, Labs, Weight trends  REASON FOR ASSESSMENT:   Malnutrition Screening Tool    ASSESSMENT:   [name] is a 80 y.o. female with a known history of HTN, HLD DM, OA  presents to the emergency department complaining of cough and shortness of breath.  Spoke with [name], Daughter at bedside. She admits to eating pretty well PTA, normally consumes 3 meals per day. Muffin and Coffee in the morning, Eats out for lunch, cooks or eats out for dinner. Patient is independent for all ADLs at this point. Nutrition-Focused physical exam completed. Findings are moderate fat depletion, no muscle depletion, and no edema.   Reports a 4# wt loss over 4 months, does not know the cause. Reports a normal wt of 140#, was 140# on admission, 145# this time last year. Patient states she cared for her Son who had nasal surgery a few weeks ago, may have contributed to wt loss.  She was eating a baked potato, tomato, and chicken during my visit, had eaten ~75% thus far.  Patient states she rarely has an appetite. She also cannot consume milk or juice, reports it causes her to have diarrhea. Agreed to have jello in between meals.  Labs and medications reviewed: Ca-Vit D, MVI w/ Minerals, Omega-3's NS @ 5175mL/hr  Diet Order:  Diet heart healthy/carb modified Room service appropriate? Yes; Fluid consistency: Thin  Skin:  Reviewed, no issues  Last BM:  03/17/2016  Height:   Ht Readings from Last 1 Encounters:  03/16/16 4\' 11"  (1.499 m)    Weight:   Wt Readings from Last 1 Encounters:  03/16/16 140 lb (63.5 kg)    Ideal Body Weight:  43.18  kg  BMI:  Body mass index is 28.28 kg/m.  Estimated Nutritional Needs:   Kcal:  1270-1500 calories  Protein:  63-76 gm  Fluid:  >/= 1.3L  EDUCATION NEEDS:   No education needs identified at this time  Dionne AnoWilliam M. Elasha Tess, MS, RD LDN Inpatient Clinical Dietitian Pager [phone]

## 2016-03-17 NOTE — Progress Notes (Signed)
Patient was calling out because she could not breathe.  This RN went to room. Patient was sitting on the side of the bed and noted to be tachypnic. Her vital signs including oxygen sats were obtained.  Heart rate was 136, blood pressure was 154/112.  This RN called for patient's primary RN, initiated rapid response and instructed for Dr. Clint GuyHower to be paged.  Rapid response team arrived.  Patient was transferred to ICU.

## 2016-03-17 NOTE — Progress Notes (Signed)
Rapid Response called to room 219. Upon my arrival to bedside at 1623, Katie, GeorgiaC from ICU at bedside along with 2C staff and Respiratory Therapist. Patient in acute respiratory distress. Very anxious stating she could not breathe. NRB mask placed on patient administering 02 @ 100%. Patient pulling at mask. Katie on phone with Dr. Clint GuyHower obtaining orders to transfer patient to ICU Stepdown to place on Bipap. Transferred to ICU 9. Was placed on Bipap immediately and given ativan IV per MD order.

## 2016-03-17 NOTE — Progress Notes (Signed)
Rapid response: Patient stated could not breath, had breathing treatment - Actually worsened afterwards most likely in relation to anxiety But given increased work of breathing and tachypnea  Transfer stepdown - place on bipap, Ativan prn anxiety

## 2016-03-17 NOTE — Significant Event (Signed)
Rapid Response Event Note  Overview: Time Called: 1619 Arrival Time: 1620 Event Type: Respiratory  Initial Focused Assessment:called for rapid response, pt flailing in bed saying " I can't breathe". refused to keep on non rebreather   Interventions: spoke with dr hower, tx to stepdown, anxiety meds and bipap ordered  Plan of Care (if not transferred):  Event Summary: resp distress/ panic/anxiety... Transferred to stepdown for bipap Name of Physician Notified: hower at 1625    at    Outcome: Transferred (Comment)  Event End Time: 1640  [name] A

## 2016-03-17 NOTE — Progress Notes (Signed)
CH responded to a PG for a RR to room 219. Pt was having difficulty breathing and began having a panic attach. (Boy friend was bedside) RR team transported Pt to IC-9. I escorted the boy friend to IC waiting area and gave updates. Son and daughter arrived shortly afterwards. RN was able to stabilize Pt and the family was allowed to be with Pt. CH provided the ministry of hospitality, presence, and prayer. CH is available for follow up as needed.    03/17/16 1800  Clinical Encounter Type  Visited With Patient;Patient and family together  Visit Type Initial;Spiritual support;Code;Critical Care (Rapid Responce )  Referral From Nurse  Consult/Referral To Chaplain  Spiritual Encounters  Spiritual Needs Prayer;Emotional

## 2016-03-17 NOTE — Progress Notes (Signed)
Patient became short of breath following nebulizer treatment unable to be calmed or follow direction. Primary RN notified attending MD - new order for anxiety medication provided, charge RN initiated rapid response.  Rapid response arrived, patient continued to have difficulty breathing at rest. Patient transferred to ICU report given in ICU. Attending physician notified. Pt significant other notified.

## 2016-03-17 NOTE — Progress Notes (Signed)
Anticoagulation monitoring(Lovenox):  80 yo  female ordered Lovenox 30 mg Q24h  Filed Weights   03/16/16 1828  Weight: 140 lb (63.5 kg)   Body mass index is 28.28 kg/m.   Lab Results  Component Value Date   CREATININE 1.02 (H) 03/17/2016   CREATININE 1.35 (H) 03/16/2016   CREATININE 1.26 (H) 03/11/2016   Estimated Creatinine Clearance: 36.8 mL/min (by C-G formula based on SCr of 1.02 mg/dL (H)). Hemoglobin & Hematocrit     Component Value Date/Time   HGB 9.8 (L) 03/17/2016 0729   HCT 29.2 (L) 03/17/2016 16100729     Per Protocol for Patient with estCrcl > 30 ml/min and BMI < 40, will transition to Lovenox 40 mg Q24h.

## 2016-03-17 NOTE — Progress Notes (Signed)
[facility] [place] [facility]   PATIENT NAME: [name]    MRN#:  [id number]  DATE OF BIRTH:  03/14/1936  SUBJECTIVE:  Hospital Day: 1 day [name] is a 80 y.o. female presenting with Cough .   Overnight events: No overnight events Interval Events: Breathing improved but still on oxygen  REVIEW OF SYSTEMS:  CONSTITUTIONAL: No fever, fatigue or weakness.  EYES: No blurred or double vision.  EARS, NOSE, AND THROAT: No tinnitus or ear pain.  RESPIRATORY: Positive cough, shortness of breath, denies wheezing or hemoptysis.  CARDIOVASCULAR: No chest pain, orthopnea, edema.  GASTROINTESTINAL: No nausea, vomiting, diarrhea or abdominal pain.  GENITOURINARY: No dysuria, hematuria.  ENDOCRINE: No polyuria, nocturia,  HEMATOLOGY: No anemia, easy bruising or bleeding SKIN: No rash or lesion. MUSCULOSKELETAL: No joint pain or arthritis.   NEUROLOGIC: No tingling, numbness, weakness.  PSYCHIATRY: No anxiety or depression.   DRUG ALLERGIES:   Allergies  Allergen Reactions  . Acetaminophen     REACTION: nausea  . Codeine     REACTION: itching  . Fluticasone Propionate     REACTION: palpitations  . Fosamax [Alendronate Sodium]     Difficulty swallowing and fever/ joint pain   . Naproxen     REACTION: GI  . Nsaids     REACTION: elevated LFT's  . Penicillins     REACTION: swelling at inj site    VITALS:  Blood pressure 119/73, pulse (!) 101, temperature 97.6 F (36.4 C), temperature source Oral, resp. rate 16, height 4\' 11"  (1.499 m), weight 63.5 kg (140 lb), last menstrual period 04/22/1985, SpO2 100 %.  PHYSICAL EXAMINATION:  VITAL SIGNS: Vitals:   03/17/16 0435 03/17/16 0436  BP: 119/73   Pulse: (!) 101 (!) 101  Resp: 16   Temp:  97.6 F (36.4 C)   GENERAL:80 y.o.female currently in no acute distress.  HEAD: Normocephalic, atraumatic.  EYES: Pupils equal, round, reactive to light. Extraocular muscles intact. No scleral  icterus.  MOUTH: Moist mucosal membrane. Dentition intact. No abscess noted.  EAR, NOSE, THROAT: Clear without exudates. No external lesions.  NECK: Supple. No thyromegaly. No nodules. No JVD.  PULMONARY: Coarse rhonchi throughout without wheeze No use of accessory muscles, Good respiratory effort. good air entry bilaterally CHEST: Nontender to palpation.  CARDIOVASCULAR: S1 and S2. Regular rate and rhythm. No murmurs, rubs, or gallops. No edema. Pedal pulses 2+ bilaterally.  GASTROINTESTINAL: Soft, nontender, nondistended. No masses. Positive bowel sounds. No hepatosplenomegaly.  MUSCULOSKELETAL: No swelling, clubbing, or edema. Range of motion full in all extremities.  NEUROLOGIC: Cranial nerves II through XII are intact. No gross focal neurological deficits. Sensation intact. Reflexes intact.  SKIN: No ulceration, lesions, rashes, or cyanosis. Skin warm and dry. Turgor intact.  PSYCHIATRIC: Mood, affect within normal limits. The patient is awake, alert and oriented x 3. Insight, judgment intact.      LABORATORY PANEL:   CBC  Recent Labs Lab 03/17/16 0729  WBC 7.6  HGB 9.8*  HCT 29.2*  PLT 184   ------------------------------------------------------------------------------------------------------------------  Chemistries   Recent Labs Lab 03/16/16 1842 03/17/16 0729  NA 136 139  K 4.0 4.0  CL 105 110  CO2 22 23  GLUCOSE 197* 163*  BUN 11 10  CREATININE 1.35* 1.02*  CALCIUM 9.6 9.2  AST 39  --   ALT 26  --   ALKPHOS 49  --   BILITOT 0.5  --    ------------------------------------------------------------------------------------------------------------------  Cardiac Enzymes  Recent Labs Lab  03/16/16 1842  TROPONINI <0.03   ------------------------------------------------------------------------------------------------------------------  RADIOLOGY:  Dg Chest 2 View  Result Date: 03/16/2016 CLINICAL DATA:  Patient with a virus for multiple days. Fever and  cough. EXAM: CHEST  2 VIEW COMPARISON:  None. FINDINGS: Normal cardiac and mediastinal contours. Heterogeneous opacities within the left mid and lower lung and right lower lung. Small bilateral pleural effusions. No pneumothorax. Mid thoracic spine degenerative changes. IMPRESSION: Heterogeneous opacities within the left mid and lower lung as well as right lower lung which are nonspecific given lack of prior exams. In the acute setting these are concerning for infectious process. Followup PA and lateral chest X-ray is recommended in 3-4 weeks following trial of antibiotic therapy to ensure resolution and exclude underlying malignancy. Small bilateral pleural effusions. Electronically Signed   By: Annia Beltrew  Davis M.D.   On: 03/16/2016 19:26    EKG:   Orders placed or performed during the hospital encounter of 03/16/16  . ED EKG  . ED EKG  . EKG 12-Lead  . EKG 12-Lead    ASSESSMENT AND PLAN:   [name] is a 80 y.o. female presenting with Cough . Admitted 03/16/2016 : Day #: 1 day 1. Acute hypoxic respiratory failure: Likely acquired pneumonia continue with breathing treatments, antibiotics day 2/5 2. Essential hypertension Lopressor lisinopril 3. Type 2 diabetes hold oral agents add sliding scale coverage   All the records are reviewed and case discussed with Care Management/Social Workerr. Management plans discussed with the patient, family and they are in agreement.  CODE STATUS: full TOTAL TIME TAKING CARE OF THIS PATIENT: 28 minutes.   POSSIBLE D/C IN 1-2DAYS, DEPENDING ON CLINICAL CONDITION.   Hower,  Mardi MainlandDavid K M.D on 03/17/2016 at 12:47 PM  Between 7am to 6pm - Pager - [phone]  After 6pm: House Pager: - [phone]  [facility]  Office  [phone]  CC: Primary care physician; Roxy MannsMarne Tower, MD

## 2016-03-18 ENCOUNTER — Inpatient Hospital Stay
Admit: 2016-03-18 | Discharge: 2016-03-18 | Disposition: A | Discharge status: Home / Self Care | Payer: Medicare Other | Attending: Internal Medicine | Admitting: Internal Medicine

## 2016-03-18 ENCOUNTER — Inpatient Hospital Stay: Payer: Medicare Other

## 2016-03-18 DIAGNOSIS — J9601 Acute respiratory failure with hypoxia: Secondary | ICD-10-CM

## 2016-03-18 LAB — GLUCOSE, CAPILLARY
GLUCOSE-CAPILLARY: 69 mg/dL (ref 65–99)
GLUCOSE-CAPILLARY: 89 mg/dL (ref 65–99)
GLUCOSE-CAPILLARY: 83 mg/dL (ref 65–99)
Glucose-Capillary: 78 mg/dL (ref 65–99)
Glucose-Capillary: 293 mg/dL — ABNORMAL HIGH (ref 65–99)
Glucose-Capillary: 110 mg/dL — ABNORMAL HIGH (ref 65–99)
Glucose-Capillary: 225 mg/dL — ABNORMAL HIGH (ref 65–99)

## 2016-03-18 LAB — URINALYSIS COMPLETE WITH MICROSCOPIC (ARMC ONLY)
BACTERIA UA: NONE SEEN
Bilirubin Urine: NEGATIVE
GLUCOSE, UA: NEGATIVE mg/dL
Ketones, ur: NEGATIVE mg/dL
LEUKOCYTES UA: NEGATIVE
NITRITE: NEGATIVE
PROTEIN: NEGATIVE mg/dL
SPECIFIC GRAVITY, URINE: 1.01 (ref 1.005–1.030)
SQUAMOUS EPITHELIAL / LPF: NONE SEEN
pH: 5 (ref 5.0–8.0)

## 2016-03-18 LAB — BRAIN NATRIURETIC PEPTIDE: B Natriuretic Peptide: 286 pg/mL — ABNORMAL HIGH (ref 0.0–100.0)

## 2016-03-18 LAB — PROCALCITONIN

## 2016-03-18 MED ORDER — FUROSEMIDE 10 MG/ML IJ SOLN
20.0000 mg | Freq: Two times a day (BID) | INTRAMUSCULAR | Status: DC
  Administered 2016-03-18 – 2016-03-20 (×4): 20 mg via INTRAVENOUS
  Filled 2016-03-18 (×4): qty 2

## 2016-03-18 MED ORDER — DEXTROSE-NACL 5-0.45 % IV SOLN
INTRAVENOUS | Status: DC
  Administered 2016-03-18: 02:00:00 via INTRAVENOUS

## 2016-03-18 MED ORDER — METHYLPREDNISOLONE SODIUM SUCC 125 MG IJ SOLR
125.0000 mg | Freq: Once | INTRAMUSCULAR | Status: AC
  Administered 2016-03-18: 125 mg via INTRAVENOUS
  Filled 2016-03-18: qty 2

## 2016-03-18 MED ORDER — MORPHINE SULFATE (PF) 4 MG/ML IV SOLN: 0.5000 mg | Freq: Three times a day (TID) | INTRAVENOUS | Status: DC | PRN

## 2016-03-18 MED ORDER — FUROSEMIDE 10 MG/ML IJ SOLN
40.0000 mg | Freq: Once | INTRAMUSCULAR | Status: AC
  Administered 2016-03-18: 40 mg via INTRAVENOUS
  Filled 2016-03-18: qty 4

## 2016-03-18 MED ORDER — IPRATROPIUM-ALBUTEROL 0.5-2.5 (3) MG/3ML IN SOLN
3.0000 mL | RESPIRATORY_TRACT | Status: DC
  Administered 2016-03-18: 3 mL via RESPIRATORY_TRACT
  Filled 2016-03-18 (×3): qty 3

## 2016-03-18 MED ORDER — METHYLPREDNISOLONE SODIUM SUCC 40 MG IJ SOLR
40.0000 mg | Freq: Two times a day (BID) | INTRAMUSCULAR | Status: DC
  Administered 2016-03-19: 40 mg via INTRAVENOUS
  Filled 2016-03-18: qty 1

## 2016-03-18 MED ORDER — IOPAMIDOL (ISOVUE-370) INJECTION 76%
75.0000 mL | Freq: Once | INTRAVENOUS | Status: AC | PRN
Start: 2016-03-18 — End: 2016-03-18
  Administered 2016-03-18: 75 mL via INTRAVENOUS

## 2016-03-18 MED ORDER — ALPRAZOLAM 0.25 MG PO TABS
0.2500 mg | ORAL_TABLET | Freq: Two times a day (BID) | ORAL | Status: DC | PRN
  Administered 2016-03-18: 0.25 mg via ORAL
  Filled 2016-03-18: qty 1

## 2016-03-18 NOTE — Progress Notes (Signed)
Earlier in shift patient had orders to be transferred to the floor, family and patient made aware. Family asked if MD would round on patient before being transferred to floor, I stated it would be possible but not for certain. Family became anxious and demanded to see MD before being transferred. I attempted to page Sheila FinnerSona Patel, MD (patients attending MD) multiple times with no response. Secretary attempted to page Sheila FinnerSona Patel, MD multiple times with no response. Charge RN made aware and went to visit patient and family, family remains anxious, questioning the charge nurse and I, stated, "he found an article on the internet that bed bugs cause MRSA pneumonia and he knew that his mother had this and was questioning why we were not testing her for this." Charge RN and I tried to explain to family that we were doing everything possible and his mother was our priority. Son further questioned Diplomatic Services operational officersecretary for patient relations but was not satisfied that someone would not come and talk to him right away so we contacted house supervisor.  At this time I reached out to [name], ICU Director and made him aware of the situation. Sheila FinnerSona Patel, MD finally rounded on patient, ordered medications and CTA.  During this time patient became increasingly anxious requiring anti-anxiety medication. Respiratory status remained stable.  After [name] rounded, I assessed patient and found her tachypneic and pulling with her accessory muscles to breathe. Placed patient on bi-pap and had charge RN come assess patient with me to see changes in condition from earlier.  MD [name]- ICU MD made aware and consult placed.  Will continue to monitor patient.

## 2016-03-18 NOTE — Consult Note (Signed)
[facility] [place] Pulmonary Medicine Consultation      Date: 03/18/2016,   MRN# [id number] MAIRE GOVAN February 05, 1936 Code Status:     Code Status Orders        Start     Ordered   03/16/16 2304  Full code  Continuous     03/16/16 2303    Code Status History    Date Active Date Inactive Code Status Order ID Comments User Context   This patient has a current code status but no historical code status.     Hosp day:@LENGTHOFSTAYDAYS @ Referring MD: @ATDPROV @     PCP:      AdmissionWeight: 140 lb (63.5 kg)                 CurrentWeight: 140 lb (63.5 kg) SAMRA PESCH is a 80 y.o. old female seen in consultation for resp distress at the request of Dr. Allena Katz.     CHIEF COMPLAINT:   I can't breathe   HISTORY OF PRESENT ILLNESS   80 y.o. female with a known history of HTN, HLD DM, OA  presents to the emergency department complaining of cough and shortness of breath.  - Patient was in a usual state of health until About 2 weeks ago when she developed a chest cold as she describes it with chest congestion - nonproductive cough, subjective fevers - She did not seek medical attention for her initial illness, did not take any medications for it.  -on day of admission,  she describes sudden onset of shortness of breath with severe cough which is nonproductive.  -She reports chest tightness secondary to coughing..  Patient reports a history of bronchial asthma but denies any history of COPD.  Otherwise there has been no change in status. Patient has been taking medication as prescribed and there has been no recent change in medication or diet.  There has been no recent illness, travel or sick contacts.    Patient denies fevers/chills (currently), weakness, dizziness, chest pain, , N/V/C/D, abdominal pain, dysuria/frequency, changes in mental status.   [facility] PCCM consulted for acute SOB, patient placed on biPAP Acute resp distress  Ct chest shows extensive Pulm edema and  Pleural effusions  I was conulted for p  PAST MEDICAL HISTORY   Past Medical History:  Diagnosis Date  . Asthma    childhood  . Depression   . Diabetes mellitus    Type II  . Foot fracture, right 3/13  . H/O scoliosis   . HLD (hyperlipidemia)   . HTN (hypertension)   . Osteoarthritis    hands,elbow,knees  . Osteoporosis   . Rhinitis, allergic   . Scoliosis    chronic back pain     SURGICAL HISTORY   Past Surgical History:  Procedure Laterality Date  . bladder tack    . TUBAL LIGATION     bilateral     FAMILY HISTORY   Family History  Problem Relation Age of Onset  . Coronary artery disease Mother   . Cancer Mother     thyroid  . Heart attack Mother   . Coronary artery disease Father   . Heart attack Father   . Multiple sclerosis Sister      SOCIAL HISTORY   Social History  Substance Use Topics  . Smoking status: Never Smoker  . Smokeless tobacco: Never Used  . Alcohol use No     MEDICATIONS    Home Medication:    Current Medication:  Current Facility-Administered Medications:  .  ALPRAZolam (XANAX) tablet 0.25 mg, 0.25 mg, Oral, BID PRN, Enedina Finner, MD, 0.25 mg at 03/18/16 1330 .  amitriptyline (ELAVIL) tablet 25 mg, 25 mg, Oral, QHS, Alexis Hugelmeyer, DO, 25 mg at 03/17/16 0135 .  aspirin EC tablet 81 mg, 81 mg, Oral, Daily, Alexis Hugelmeyer, DO, 81 mg at 03/18/16 0909 .  azithromycin (ZITHROMAX) tablet 500 mg, 500 mg, Oral, q1800, Cindi Carbon, RPH .  bisacodyl (DULCOLAX) EC tablet 5 mg, 5 mg, Oral, Daily PRN, Alexis Hugelmeyer, DO .  calcium-vitamin D (OSCAL WITH D) 500-200 MG-UNIT per tablet 1 tablet, 1 tablet, Oral, Daily, Alexis Hugelmeyer, DO, 1 tablet at 03/18/16 0908 .  cefTRIAXone (ROCEPHIN) IVPB 1 g, 1 g, Intravenous, Q24H, Alexis Hugelmeyer, DO, 1 g at 03/17/16 2052 .  chlorhexidine (PERIDEX) 0.12 % solution 15 mL, 15 mL, Mouth Rinse, BID, Shane Crutch, MD .  guaiFENesin (MUCINEX) 12 hr tablet 600 mg, 600 mg, Oral,  BID, 600 mg at 03/18/16 0908 **AND** dextromethorphan (DELSYM) 30 MG/5ML liquid 30 mg, 30 mg, Oral, BID, Alexis Hugelmeyer, DO, 30 mg at 03/17/16 1310 .  enoxaparin (LOVENOX) injection 40 mg, 40 mg, Subcutaneous, QHS, Alexis Hugelmeyer, DO, 40 mg at 03/17/16 2151 .  insulin aspart (novoLOG) injection 0-15 Units, 0-15 Units, Subcutaneous, TID WC, Alexis Hugelmeyer, DO, 8 Units at 03/18/16 1120 .  insulin aspart (novoLOG) injection 0-5 Units, 0-5 Units, Subcutaneous, QHS, Alexis Hugelmeyer, DO .  ipratropium-albuterol (DUONEB) 0.5-2.5 (3) MG/3ML nebulizer solution 3 mL, 3 mL, Nebulization, Q4H, Erin Fulling, MD, 3 mL at 03/18/16 1612 .  lisinopril (PRINIVIL,ZESTRIL) tablet 40 mg, 40 mg, Oral, QHS, Alexis Hugelmeyer, DO, 40 mg at 03/17/16 0136 .  magnesium citrate solution 1 Bottle, 1 Bottle, Oral, Once PRN, Alexis Hugelmeyer, DO .  meclizine (ANTIVERT) tablet 25 mg, 25 mg, Oral, BID PRN, Alexis Hugelmeyer, DO .  MEDLINE mouth rinse, 15 mL, Mouth Rinse, q12n4p, Shane Crutch, MD, 15 mL at 03/18/16 1200 .  methylPREDNISolone sodium succinate (SOLU-MEDROL) 40 mg/mL injection 40 mg, 40 mg, Intravenous, Q12H, Erin Fulling, MD .  metoprolol tartrate (LOPRESSOR) tablet 25 mg, 25 mg, Oral, BID, Alexis Hugelmeyer, DO, 25 mg at 03/18/16 0909 .  morphine 4 MG/ML injection 0.52 mg, 0.52 mg, Intravenous, TID PRN, Enedina Finner, MD .  multivitamin with minerals tablet 1 tablet, 1 tablet, Oral, Daily, Alexis Hugelmeyer, DO, 1 tablet at 03/18/16 0908 .  omega-3 acid ethyl esters (LOVAZA) capsule 1 g, 1 capsule, Oral, BID, Alexis Hugelmeyer, DO, 1 g at 03/18/16 0908 .  ondansetron (ZOFRAN) tablet 4 mg, 4 mg, Oral, Q6H PRN **OR** ondansetron (ZOFRAN) injection 4 mg, 4 mg, Intravenous, Q6H PRN, Alexis Hugelmeyer, DO .  pantoprazole (PROTONIX) EC tablet 40 mg, 40 mg, Oral, QAC breakfast, Alexis Hugelmeyer, DO, 40 mg at 03/18/16 0802 .  senna-docusate (Senokot-S) tablet 1 tablet, 1 tablet, Oral, QHS PRN, Alexis  Hugelmeyer, DO .  zolpidem (AMBIEN) tablet 5 mg, 5 mg, Oral, QHS PRN, Alexis Hugelmeyer, DO    ALLERGIES   Acetaminophen; Codeine; Fluticasone propionate; Fosamax [alendronate sodium]; Naproxen; Nsaids; and Penicillins     REVIEW OF SYSTEMS   Review of Systems  Constitutional: Positive for malaise/fatigue. Negative for chills, diaphoresis, fever and weight loss.  HENT: Negative for congestion and hearing loss.   Eyes: Negative for blurred vision and double vision.  Respiratory: Positive for cough, shortness of breath and wheezing. Negative for hemoptysis and sputum production.   Cardiovascular: Negative for chest pain, palpitations and orthopnea.  Gastrointestinal: Negative for abdominal pain, heartburn, nausea  and vomiting.  Genitourinary: Negative for dysuria and urgency.  Musculoskeletal: Negative for back pain, myalgias and neck pain.  Skin: Negative for rash.  Neurological: Negative for dizziness, tingling, tremors, weakness and headaches.  Endo/Heme/Allergies: Does not bruise/bleed easily.  Psychiatric/Behavioral: Negative for depression, substance abuse and suicidal ideas.  All other systems reviewed and are negative.    VS: BP (!) 146/127   Pulse (!) 132   Temp 97.6 F (36.4 C) (Oral)   Resp (!) 28   Ht 4\' 11"  (1.499 m)   Wt 140 lb (63.5 kg)   LMP 04/22/1985   SpO2 97%   BMI 28.28 kg/m      PHYSICAL EXAM  Physical Exam  Constitutional: She is oriented to person, place, and time. She appears well-developed and well-nourished. She appears distressed.  HENT:  Head: Normocephalic and atraumatic.  Mouth/Throat: No oropharyngeal exudate.  Eyes: EOM are normal. Pupils are equal, round, and reactive to light. No scleral icterus.  Neck: Normal range of motion. Neck supple.  Cardiovascular: Normal rate, regular rhythm and normal heart sounds.   No murmur heard. Pulmonary/Chest: No stridor. She is in respiratory distress. She has wheezes. She has rales.    Abdominal: Soft. Bowel sounds are normal.  Musculoskeletal: Normal range of motion. She exhibits no edema.  Neurological: She is alert and oriented to person, place, and time. No cranial nerve deficit.  Skin: Skin is warm. She is not diaphoretic.  Psychiatric: She has a normal mood and affect.        LABS    Recent Labs     03/16/16  1842  03/17/16  0729  HGB  11.2*  9.8*  HCT  32.6*  29.2*  MCV  89.1  90.4  WBC  8.2  7.6  BUN  11  10  CREATININE  1.35*  1.02*  GLUCOSE  197*  163*  CALCIUM  9.6  9.2  ,    No results for input(s): PH in the last 72 hours.  Invalid input(s): PCO2, PO2, BASEEXCESS, BASEDEFICITE, TFT    CULTURE RESULTS   Recent Results (from the past 240 hour(s))  Blood culture (routine x 2)     Status: None (Preliminary result)   Collection Time: 03/16/16  9:52 PM  Result Value Ref Range Status   Specimen Description BLOOD RIGHT ASSIST CONTROL  Final   Special Requests BOTTLES DRAWN AEROBIC AND ANAEROBIC 7CCAERO,8CCANA  Final   Culture NO GROWTH 2 DAYS  Final   Report Status PENDING  Incomplete  Blood culture (routine x 2)     Status: None (Preliminary result)   Collection Time: 03/16/16  9:52 PM  Result Value Ref Range Status   Specimen Description BLOOD LEFT ASSIST CONTROL  Final   Special Requests   Final    BOTTLES DRAWN AEROBIC AND ANAEROBIC 11CCAERO,12CCANA   Culture NO GROWTH 2 DAYS  Final   Report Status PENDING  Incomplete          IMAGING    Dg Chest 2 View  Result Date: 03/16/2016 CLINICAL DATA:  Patient with a virus for multiple days. Fever and cough. EXAM: CHEST  2 VIEW COMPARISON:  None. FINDINGS: Normal cardiac and mediastinal contours. Heterogeneous opacities within the left mid and lower lung and right lower lung. Small bilateral pleural effusions. No pneumothorax. Mid thoracic spine degenerative changes. IMPRESSION: Heterogeneous opacities within the left mid and lower lung as well as right lower lung which are  nonspecific given lack of prior exams. In the  acute setting these are concerning for infectious process. Followup PA and lateral chest X-ray is recommended in 3-4 weeks following trial of antibiotic therapy to ensure resolution and exclude underlying malignancy. Small bilateral pleural effusions. Electronically Signed   By: Annia Beltrew  Davis M.D.   On: 03/16/2016 19:26   Ct Angio Chest Pe W Or Wo Contrast  Result Date: 03/18/2016 CLINICAL DATA:  Cough and shortness of breath. Acute hypoxic respiratory failure. EXAM: CT ANGIOGRAPHY CHEST WITH CONTRAST TECHNIQUE: Multidetector CT imaging of the chest was performed using the standard protocol during bolus administration of intravenous contrast. Multiplanar CT image reconstructions and MIPs were obtained to evaluate the vascular anatomy. CONTRAST:  75 mL of Isovue 370 COMPARISON:  Chest x-ray March 16, 2016 FINDINGS: Cardiovascular: Coronary artery calcifications identified. The heart size is borderline but not grossly enlarged. The thoracic aorta is normal in caliber. The aorta is poorly enhanced but no evidence of dissection is identified. Evaluation of the lower lobe pulmonary arteries is limited due to significant compressive atelectasis associated with the lower lobe effusions. Within these limitations, no pulmonary emboli identified. Mediastinum/Nodes: No adenopathy in the chest. The esophagus is normal. No pericardial effusion. Lungs/Pleura: Bilateral moderate-sized pleural effusions with atelectasis are identified, much larger in the 2 day interval. Mild interlobular septal thickening. Tiny ground-glass nodule in the anterior left lung measuring 3 mm on series 6, image 35. No other abnormalities within the lung parenchyma. Scattered atelectasis. Central airways are unremarkable. No pneumothorax. Upper Abdomen: Limited views of the upper abdomen demonstrated a few mildly prominent porta hepatis nodes such as on series 4, image 90 measuring 11 mm in short axis.  No other abnormalities. Musculoskeletal: Degenerative changes in the spine. Review of the MIP images confirms the above findings. IMPRESSION: 1. Moderate bilateral pleural effusions, significantly larger in the 2 day interval. Interlobular septal thickening. Volume overload should be considered. However, the heart is borderline but not grossly enlarged. Recommend clinical correlation. 2. No pulmonary emboli identified. 3. 3 mm ground-glass nodule in the left lung. No follow-up recommended. This recommendation follows the consensus statement: Guidelines for Management of Incidental Pulmonary Nodules Detected on CT Images: From the Fleischner Society 2017; Radiology 2017; 284:228-243. 4. Mildly prominent porta hepatis nodes measuring up to 11 mm. These are nonspecific and incompletely evaluated. 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  The Patient requires high complexity decision making for assessment and support, frequent evaluation and titration of therapies, application of advanced monitoring technologies and extensive interpretation of multiple databases.   Patient/Family are satisfied with Plan of action and management.  All questions answered  Lucie LeatherKurian David Enez Monahan, M.D.  [facility]  Medical Director [facility] Medical Director [facility]

## 2016-03-18 NOTE — Progress Notes (Signed)
[name] is a 80 y.o. female  [id number]  Primary Cardiologist: Neoma Laming Reason for Consultation: Abnormal EKG and shortness of breath  HPI: This is a 80 year old female presented to the hospital with community-acquired pneumonia and was placed on BiPAP because of severe shortness of breath. She became very tachycardic prior to BiPAP and after discontinuation of BiPAP. I was asked to evaluate the patient because of abnormal EKG.   Review of Systems: Unable to obtain   Past Medical History:  Diagnosis Date  . Asthma    childhood  . Depression   . Diabetes mellitus    Type II  . Foot fracture, right 3/13  . H/O scoliosis   . HLD (hyperlipidemia)   . HTN (hypertension)   . Osteoarthritis    hands,elbow,knees  . Osteoporosis   . Rhinitis, allergic   . Scoliosis    chronic back pain    Medications Prior to Admission  Medication Sig Dispense Refill  . albuterol (PROVENTIL HFA;VENTOLIN HFA) 108 (90 BASE) MCG/ACT inhaler Inhale 2 puffs into the lungs every 4 (four) hours as needed for wheezing. 1 Inhaler 3  . amitriptyline (ELAVIL) 25 MG tablet Take 1 tablet (25 mg total) by mouth at bedtime. 90 tablet 0  . aspirin 81 MG tablet Take 81 mg by mouth daily.      . Blood Glucose Monitoring Suppl (ONETOUCH VERIO FLEX SYSTEM) W/DEVICE KIT 1 Device by Other route daily. Check blood sugar once daily and as directed. Dx E11.9 1 kit 0  . Calcium Carbonate-Vitamin D (CALCIUM 600+D) 600-400 MG-UNIT per tablet Take 2 tablets by mouth daily.      . fish oil-omega-3 fatty acids 1000 MG capsule Take 1 capsule by mouth 2 (two) times daily.     Marland Kitchen glipiZIDE (GLUCOTROL XL) 5 MG 24 hr tablet Take 1 tablet (5 mg total) by mouth daily. 60 tablet 3  . Ibuprofen-Diphenhydramine Cit (ADVIL PM PO) Take by mouth as needed.     . meclizine (ANTIVERT) 25 MG tablet Take 25 mg by mouth as needed.    . metFORMIN (GLUCOPHAGE) 500 MG tablet TAKE 2 TABLETS BY MOUTH EVERY MORNING, 1 TAB AT LUNCH AND 1  TAB AT SUPPER 360 tablet 0  . metoprolol tartrate (LOPRESSOR) 25 MG tablet TAKE 1 TABLET (25 MG TOTAL) BY MOUTH 2 (TWO) TIMES DAILY. 180 tablet 3  . Multiple Vitamin (MULTIVITAMIN) tablet Take 1 tablet by mouth daily.      Marland Kitchen omeprazole (PRILOSEC) 20 MG capsule Take 20 mg by mouth daily.    . ONE TOUCH LANCETS MISC Check blood sugar once daily and as directed. Dx E11.9 200 each 3  . ONETOUCH VERIO test strip CHECK BLOOD SUGAR ONCE DAILY AND AS DIRECTED. DX E11.9 100 each 0  . quinapril (ACCUPRIL) 40 MG tablet TAKE 1 TABLET AT BEDTIME 90 tablet 1  . atorvastatin (LIPITOR) 40 MG tablet TAKE 1 TABLET BY MOUTH DAILY (Patient not taking: Reported on 03/16/2016) 90 tablet 3  . cyanocobalamin (,VITAMIN B-12,) 1000 MCG/ML injection Inject 1 mL (1,000 mcg total) into the muscle every 6 (six) weeks. (Patient not taking: Reported on 03/16/2016) 1 mL 11     . amitriptyline  25 mg Oral QHS  . aspirin EC  81 mg Oral Daily  . azithromycin  500 mg Oral q1800  . calcium-vitamin D  1 tablet Oral Daily  . cefTRIAXone  1 g Intravenous Q24H  . chlorhexidine  15 mL Mouth Rinse BID  .  guaiFENesin  600 mg Oral BID   And  . dextromethorphan  30 mg Oral BID  . enoxaparin (LOVENOX) injection  40 mg Subcutaneous QHS  . insulin aspart  0-15 Units Subcutaneous TID WC  . insulin aspart  0-5 Units Subcutaneous QHS  . ipratropium-albuterol  3 mL Nebulization Q4H  . lisinopril  40 mg Oral QHS  . mouth rinse  15 mL Mouth Rinse q12n4p  . methylPREDNISolone (SOLU-MEDROL) injection  40 mg Intravenous Q12H  . metoprolol tartrate  25 mg Oral BID  . multivitamin with minerals  1 tablet Oral Daily  . omega-3 acid ethyl esters  1 capsule Oral BID  . pantoprazole  40 mg Oral QAC breakfast    Infusions:   Allergies  Allergen Reactions  . Acetaminophen     REACTION: nausea  . Codeine     REACTION: itching  . Fluticasone Propionate     REACTION: palpitations  . Fosamax [Alendronate Sodium]     Difficulty swallowing  and fever/ joint pain   . Naproxen     REACTION: GI  . Nsaids     REACTION: elevated LFT's  . Penicillins     REACTION: swelling at inj site    Social History   Social History  . Marital status: Widowed    Spouse name: N/A  . Number of children: N/A  . Years of education: N/A   Occupational History  . Not on file.   Social History Main Topics  . Smoking status: Never Smoker  . Smokeless tobacco: Never Used  . Alcohol use No  . Drug use: No  . Sexual activity: No   Other Topics Concern  . Not on file   Social History Narrative  . No narrative on file    Family History  Problem Relation Age of Onset  . Coronary artery disease Mother   . Cancer Mother     thyroid  . Heart attack Mother   . Coronary artery disease Father   . Heart attack Father   . Multiple sclerosis Sister     PHYSICAL EXAM: Vitals:   03/18/16 1400 03/18/16 1500  BP: 136/83 (!) 146/127  Pulse: (!) 116 (!) 132  Resp: (!) 31 (!) 28  Temp:       Intake/Output Summary (Last 24 hours) at 03/18/16 1558 Last data filed at 03/18/16 1558  Gross per 24 hour  Intake           543.33 ml  Output             1050 ml  Net          -506.67 ml    General:  Well appearing. No respiratory difficulty HEENT: normal Neck: supple. no JVD. Carotids 2+ bilat; no bruits. No lymphadenopathy or thryomegaly appreciated. Cor: PMI nondisplaced. Regular rate & rhythm. No rubs, gallops or murmurs. Lungs: clear Abdomen: soft, nontender, nondistended. No hepatosplenomegaly. No bruits or masses. Good bowel sounds. Extremities: no cyanosis, clubbing, rash, edema Neuro: alert & oriented x 3, cranial nerves grossly intact. moves all 4 extremities w/o difficulty. Affect pleasant.  ECG: Sinus tachycardia with left bundle branch block and nonspecific ST-T changes  Results for orders placed or performed during the hospital encounter of 03/16/16 (from the past 24 hour(s))  Glucose, capillary     Status: Abnormal    Collection Time: 03/17/16  4:44 PM  Result Value Ref Range   Glucose-Capillary 252 (H) 65 - 99 mg/dL  Glucose, capillary  Status: Abnormal   Collection Time: 03/17/16  9:26 PM  Result Value Ref Range   Glucose-Capillary 103 (H) 65 - 99 mg/dL  Glucose, capillary     Status: None   Collection Time: 03/18/16  1:23 AM  Result Value Ref Range   Glucose-Capillary 69 65 - 99 mg/dL  Glucose, capillary     Status: None   Collection Time: 03/18/16  1:25 AM  Result Value Ref Range   Glucose-Capillary 78 65 - 99 mg/dL  Glucose, capillary     Status: None   Collection Time: 03/18/16  3:18 AM  Result Value Ref Range   Glucose-Capillary 89 65 - 99 mg/dL  Urinalysis complete, with microscopic ([facility] only)     Status: Abnormal   Collection Time: 03/18/16  5:10 AM  Result Value Ref Range   Color, Urine YELLOW (A) YELLOW   APPearance CLEAR (A) CLEAR   Glucose, UA NEGATIVE NEGATIVE mg/dL   Bilirubin Urine NEGATIVE NEGATIVE   Ketones, ur NEGATIVE NEGATIVE mg/dL   Specific Gravity, Urine 1.010 1.005 - 1.030   Hgb urine dipstick 1+ (A) NEGATIVE   pH 5.0 5.0 - 8.0   Protein, ur NEGATIVE NEGATIVE mg/dL   Nitrite NEGATIVE NEGATIVE   Leukocytes, UA NEGATIVE NEGATIVE   RBC / HPF 6-30 0 - 5 RBC/hpf   WBC, UA 0-5 0 - 5 WBC/hpf   Bacteria, UA NONE SEEN NONE SEEN   Squamous Epithelial / LPF NONE SEEN NONE SEEN   Mucous PRESENT    Hyaline Casts, UA PRESENT   Glucose, capillary     Status: None   Collection Time: 03/18/16  7:20 AM  Result Value Ref Range   Glucose-Capillary 83 65 - 99 mg/dL  Glucose, capillary     Status: Abnormal   Collection Time: 03/18/16 11:12 AM  Result Value Ref Range   Glucose-Capillary 293 (H) 65 - 99 mg/dL   Dg Chest 2 View  Result Date: 03/16/2016 CLINICAL DATA:  Patient with a virus for multiple days. Fever and cough. EXAM: CHEST  2 VIEW COMPARISON:  None. FINDINGS: Normal cardiac and mediastinal contours. Heterogeneous opacities within the left mid and lower lung and  right lower lung. Small bilateral pleural effusions. No pneumothorax. Mid thoracic spine degenerative changes. IMPRESSION: Heterogeneous opacities within the left mid and lower lung as well as right lower lung which are nonspecific given lack of prior exams. In the acute setting these are concerning for infectious process. Followup PA and lateral chest X-ray is recommended in 3-4 weeks following trial of antibiotic therapy to ensure resolution and exclude underlying malignancy. Small bilateral pleural effusions. Electronically Signed   By: Lovey Newcomer M.D.   On: 03/16/2016 19:26   Ct Angio Chest Pe W Or Wo Contrast  Result Date: 03/18/2016 CLINICAL DATA:  Cough and shortness of breath. Acute hypoxic respiratory failure. EXAM: CT ANGIOGRAPHY CHEST WITH CONTRAST TECHNIQUE: Multidetector CT imaging of the chest was performed using the standard protocol during bolus administration of intravenous contrast. Multiplanar CT image reconstructions and MIPs were obtained to evaluate the vascular anatomy. CONTRAST:  75 mL of Isovue 370 COMPARISON:  Chest x-ray March 16, 2016 FINDINGS: Cardiovascular: Coronary artery calcifications identified. The heart size is borderline but not grossly enlarged. The thoracic aorta is normal in caliber. The aorta is poorly enhanced but no evidence of dissection is identified. Evaluation of the lower lobe pulmonary arteries is limited due to significant compressive atelectasis associated with the lower lobe effusions. Within these limitations, no pulmonary emboli identified.  Mediastinum/Nodes: No adenopathy in the chest. The esophagus is normal. No pericardial effusion. Lungs/Pleura: Bilateral moderate-sized pleural effusions with atelectasis are identified, much larger in the 2 day interval. Mild interlobular septal thickening. Tiny ground-glass nodule in the anterior left lung measuring 3 mm on series 6, image 35. No other abnormalities within the lung parenchyma. Scattered  atelectasis. Central airways are unremarkable. No pneumothorax. Upper Abdomen: Limited views of the upper abdomen demonstrated a few mildly prominent porta hepatis nodes such as on series 4, image 90 measuring 11 mm in short axis. No other abnormalities. Musculoskeletal: Degenerative changes in the spine. Review of the MIP images confirms the above findings. IMPRESSION: 1. Moderate bilateral pleural effusions, significantly larger in the 2 day interval. Interlobular septal thickening. Volume overload should be considered. However, the heart is borderline but not grossly enlarged. Recommend clinical correlation. 2. No pulmonary emboli identified. 3. 3 mm ground-glass nodule in the left lung. No follow-up recommended. This recommendation follows the consensus statement: Guidelines for Management of Incidental Pulmonary Nodules Detected on CT Images: From the Fleischner Society 2017; Radiology 2017; 284:228-243. 4. Mildly prominent porta hepatis nodes measuring up to 11 mm. These are nonspecific and incompletely evaluated. Electronically Signed   By: Dorise Bullion III M.D   On: 03/18/2016 15:53     ASSESSMENT AND PLAN: Shortness of breath with community-acquired pneumonia and no evidence of pulmonary embolism with sinus tachycardia and bundle branch block. Advise getting echocardiogram and will make further recommendation after looking at echocardiogram.  Daquavion Catala A

## 2016-03-18 NOTE — Progress Notes (Signed)
Paged Dr. Emmit PomfretHugelmeyer concerning pt's blood sugar levels.  Blood sugar checked because of prior drop from 252 to 103 at the beginning of my shift.  When rechecked, blood sugar was 69.  I did a second recheck and blood sugar was at 75.  Per Dr. Emmit PomfretHugelmeyer, stop the NS drip and begin D5 1/2NS at 50 ml/hr.  Pt remains lethargic and on bipap at 40%.

## 2016-03-18 NOTE — Progress Notes (Deleted)
Follow-up Outpatient Visit Date: 03/19/2016  Chief Complaint: Follow-up palpitations  HPI:  [name] is a 80 y.o. year-old female with history of tachycardia, hypertension, hyperlipidemia, type 2 diabetes mellitus, who presents for follow-up of palpitations. She was previously followed in our office by Dr. Acie Fredrickson.  --------------------------------------------------------------------------------------------------  Cardiovascular History & Procedures: Cardiovascular Problems:  Palpitations  Risk Factors:  Hypertension, hyperlipidemia, diabetes mellitus, and age > 36  Cath/PCI:  None  CV Surgery:  None  EP Procedures and Devices:  None  Non-Invasive Evaluation(s):  None  Recent CV Pertinent Labs: Lab Results  Component Value Date   CHOL 200 03/11/2016   HDL 47.80 03/11/2016   LDLCALC 124 (H) 03/11/2016   TRIG 138.0 03/11/2016   CHOLHDL 4 03/11/2016   K 4.0 03/17/2016   BUN 10 03/17/2016   CREATININE 1.02 (H) 03/17/2016   CREATININE 0.81 01/11/2011    Past medical and surgical history were reviewed and updated in EPIC.   Facility-Administered Encounter Medications as of 03/19/2016  Medication  . amitriptyline (ELAVIL) tablet 25 mg  . aspirin EC tablet 81 mg  . azithromycin (ZITHROMAX) tablet 500 mg  . bisacodyl (DULCOLAX) EC tablet 5 mg  . calcium-vitamin D (OSCAL WITH D) 500-200 MG-UNIT per tablet 1 tablet  . cefTRIAXone (ROCEPHIN) IVPB 1 g  . chlorhexidine (PERIDEX) 0.12 % solution 15 mL  . guaiFENesin (MUCINEX) 12 hr tablet 600 mg   And  . dextromethorphan (DELSYM) 30 MG/5ML liquid 30 mg  . dextrose 5 %-0.45 % sodium chloride infusion  . enoxaparin (LOVENOX) injection 40 mg  . insulin aspart (novoLOG) injection 0-15 Units  . insulin aspart (novoLOG) injection 0-5 Units  . ipratropium-albuterol (DUONEB) 0.5-2.5 (3) MG/3ML nebulizer solution 3 mL  . lisinopril (PRINIVIL,ZESTRIL) tablet 40 mg  . LORazepam (ATIVAN) injection 2 mg  . magnesium  citrate solution 1 Bottle  . meclizine (ANTIVERT) tablet 25 mg  . MEDLINE mouth rinse  . metoprolol tartrate (LOPRESSOR) tablet 25 mg  . multivitamin with minerals tablet 1 tablet  . omega-3 acid ethyl esters (LOVAZA) capsule 1 g  . ondansetron (ZOFRAN) tablet 4 mg   Or  . ondansetron (ZOFRAN) injection 4 mg  . pantoprazole (PROTONIX) EC tablet 40 mg  . senna-docusate (Senokot-S) tablet 1 tablet  . zolpidem (AMBIEN) tablet 5 mg   Outpatient Encounter Prescriptions as of 03/19/2016  Medication Sig  . albuterol (PROVENTIL HFA;VENTOLIN HFA) 108 (90 BASE) MCG/ACT inhaler Inhale 2 puffs into the lungs every 4 (four) hours as needed for wheezing.  Marland Kitchen amitriptyline (ELAVIL) 25 MG tablet Take 1 tablet (25 mg total) by mouth at bedtime.  Marland Kitchen aspirin 81 MG tablet Take 81 mg by mouth daily.    Marland Kitchen atorvastatin (LIPITOR) 40 MG tablet TAKE 1 TABLET BY MOUTH DAILY (Patient not taking: Reported on 03/16/2016)  . Blood Glucose Monitoring Suppl (ONETOUCH VERIO FLEX SYSTEM) W/DEVICE KIT 1 Device by Other route daily. Check blood sugar once daily and as directed. Dx E11.9  . Calcium Carbonate-Vitamin D (CALCIUM 600+D) 600-400 MG-UNIT per tablet Take 2 tablets by mouth daily.    . cyanocobalamin (,VITAMIN B-12,) 1000 MCG/ML injection Inject 1 mL (1,000 mcg total) into the muscle every 6 (six) weeks. (Patient not taking: Reported on 03/16/2016)  . fish oil-omega-3 fatty acids 1000 MG capsule Take 1 capsule by mouth 2 (two) times daily.   Marland Kitchen glipiZIDE (GLUCOTROL XL) 5 MG 24 hr tablet Take 1 tablet (5 mg total) by mouth daily.  . Ibuprofen-Diphenhydramine Cit (ADVIL PM PO)  Take by mouth as needed.   . meclizine (ANTIVERT) 25 MG tablet Take 25 mg by mouth as needed.  . metFORMIN (GLUCOPHAGE) 500 MG tablet TAKE 2 TABLETS BY MOUTH EVERY MORNING, 1 TAB AT LUNCH AND 1 TAB AT SUPPER  . metoprolol tartrate (LOPRESSOR) 25 MG tablet TAKE 1 TABLET (25 MG TOTAL) BY MOUTH 2 (TWO) TIMES DAILY.  . Multiple Vitamin (MULTIVITAMIN)  tablet Take 1 tablet by mouth daily.    Marland Kitchen omeprazole (PRILOSEC) 20 MG capsule Take 20 mg by mouth daily.  . ONE TOUCH LANCETS MISC Check blood sugar once daily and as directed. Dx E11.9  . ONETOUCH VERIO test strip CHECK BLOOD SUGAR ONCE DAILY AND AS DIRECTED. DX E11.9  . quinapril (ACCUPRIL) 40 MG tablet TAKE 1 TABLET AT BEDTIME    Allergies: Acetaminophen; Codeine; Fluticasone propionate; Fosamax [alendronate sodium]; Naproxen; Nsaids; and Penicillins  Social History   Social History  . Marital status: Widowed    Spouse name: N/A  . Number of children: N/A  . Years of education: N/A   Occupational History  . Not on file.   Social History Main Topics  . Smoking status: Never Smoker  . Smokeless tobacco: Never Used  . Alcohol use No  . Drug use: No  . Sexual activity: No   Other Topics Concern  . Not on file   Social History Narrative  . No narrative on file    Family History  Problem Relation Age of Onset  . Coronary artery disease Mother   . Cancer Mother     thyroid  . Heart attack Mother   . Coronary artery disease Father   . Heart attack Father   . Multiple sclerosis Sister     Review of Systems: A 12-system review of systems was performed and was negative except as noted in the HPI.  --------------------------------------------------------------------------------------------------  Physical Exam: LMP 04/22/1985   General:  *** HEENT: No conjunctival pallor or scleral icterus.  Moist mucous membranes.  OP clear. Neck: Supple without lymphadenopathy, thyromegaly, JVD, or HJR.  No carotid bruit. Lungs: Normal work of breathing.  Clear to auscultation bilaterally without wheezes or crackles. Heart: Regular rate and rhythm without murmurs, rubs, or gallops.  Non-displaced PMI. Abd: Bowel sounds present.  Soft, NT/ND without hepatosplenomegaly Ext: No lower extremity edema.  Radial, PT, and DP pulses are 2+ bilaterally. Skin: warm and dry without  rash  EKG:  ***  Lab Results  Component Value Date   WBC 7.6 03/17/2016   HGB 9.8 (L) 03/17/2016   HCT 29.2 (L) 03/17/2016   MCV 90.4 03/17/2016   PLT 184 03/17/2016    Lab Results  Component Value Date   NA 139 03/17/2016   K 4.0 03/17/2016   CL 110 03/17/2016   CO2 23 03/17/2016   BUN 10 03/17/2016   CREATININE 1.02 (H) 03/17/2016   GLUCOSE 163 (H) 03/17/2016   ALT 26 03/16/2016    Lab Results  Component Value Date   CHOL 200 03/11/2016   HDL 47.80 03/11/2016   LDLCALC 124 (H) 03/11/2016   TRIG 138.0 03/11/2016   CHOLHDL 4 03/11/2016    --------------------------------------------------------------------------------------------------  ASSESSMENT AND PLAN: Harrell Gave Taedyn Glasscock, MD 03/18/2016 8:39 AM

## 2016-03-18 NOTE — Progress Notes (Signed)
[facility] - [place] at [facility]   PATIENT NAME: [name]    MR#:  [id number]  DATE OF BIRTH:  10-Oct-1935  SUBJECTIVE:    REVIEW OF SYSTEMS:   ROS Tolerating Diet: Tolerating PT:   DRUG ALLERGIES:   Allergies  Allergen Reactions  . Acetaminophen     REACTION: nausea  . Codeine     REACTION: itching  . Fluticasone Propionate     REACTION: palpitations  . Fosamax [Alendronate Sodium]     Difficulty swallowing and fever/ joint pain   . Naproxen     REACTION: GI  . Nsaids     REACTION: elevated LFT's  . Penicillins     REACTION: swelling at inj site    VITALS:  Blood pressure (!) 96/49, pulse (!) 108, temperature 97.6 F (36.4 C), temperature source Oral, resp. rate (!) 30, height 4\' 11"  (1.499 m), weight 63.5 kg (140 lb), last menstrual period 04/22/1985, SpO2 98 %.  PHYSICAL EXAMINATION:   Physical Exam  GENERAL:  80 y.o.-year-old patient lying in the bed with no acute distress.  EYES: Pupils equal, round, reactive to light and accommodation. No scleral icterus. Extraocular muscles intact.  HEENT: Head atraumatic, normocephalic. Oropharynx and nasopharynx clear.  NECK:  Supple, no jugular venous distention. No thyroid enlargement, no tenderness.  LUNGS: Normal breath sounds bilaterally, no wheezing, rales, rhonchi. No use of accessory muscles of respiration.  CARDIOVASCULAR: S1, S2 normal. No murmurs, rubs, or gallops.  ABDOMEN: Soft, nontender, nondistended. Bowel sounds present. No organomegaly or mass.  EXTREMITIES: No cyanosis, clubbing or edema b/l.    NEUROLOGIC: Cranial nerves II through XII are intact. No focal Motor or sensory deficits b/l.   PSYCHIATRIC:  patient is alert and oriented x 3.  SKIN: No obvious rash, lesion, or ulcer.   LABORATORY PANEL:  CBC  Recent Labs Lab 03/17/16 0729  WBC 7.6  HGB 9.8*  HCT 29.2*  PLT 184    Chemistries   Recent Labs Lab 03/16/16 1842 03/17/16 0729  NA 136 139  K 4.0  4.0  CL 105 110  CO2 22 23  GLUCOSE 197* 163*  BUN 11 10  CREATININE 1.35* 1.02*  CALCIUM 9.6 9.2  AST 39  --   ALT 26  --   ALKPHOS 49  --   BILITOT 0.5  --    Cardiac Enzymes  Recent Labs Lab 03/16/16 1842  TROPONINI <0.03   RADIOLOGY:  Dg Chest 2 View  Result Date: 03/16/2016 CLINICAL DATA:  Patient with a virus for multiple days. Fever and cough. EXAM: CHEST  2 VIEW COMPARISON:  None. FINDINGS: Normal cardiac and mediastinal contours. Heterogeneous opacities within the left mid and lower lung and right lower lung. Small bilateral pleural effusions. No pneumothorax. Mid thoracic spine degenerative changes. IMPRESSION: Heterogeneous opacities within the left mid and lower lung as well as right lower lung which are nonspecific given lack of prior exams. In the acute setting these are concerning for infectious process. Followup PA and lateral chest X-ray is recommended in 3-4 weeks following trial of antibiotic therapy to ensure resolution and exclude underlying malignancy. Small bilateral pleural effusions. Electronically Signed   By: Annia Beltrew  Davis M.D.   On: 03/16/2016 19:26   Ct Angio Chest Pe W Or Wo Contrast  Result Date: 03/18/2016 CLINICAL DATA:  Cough and shortness of breath. Acute hypoxic respiratory failure. EXAM: CT ANGIOGRAPHY CHEST WITH CONTRAST TECHNIQUE: Multidetector CT imaging of the chest was performed using the standard  protocol during bolus administration of intravenous contrast. Multiplanar CT image reconstructions and MIPs were obtained to evaluate the vascular anatomy. CONTRAST:  75 mL of Isovue 370 COMPARISON:  Chest x-ray March 16, 2016 FINDINGS: Cardiovascular: Coronary artery calcifications identified. The heart size is borderline but not grossly enlarged. The thoracic aorta is normal in caliber. The aorta is poorly enhanced but no evidence of dissection is identified. Evaluation of the lower lobe pulmonary arteries is limited due to significant compressive  atelectasis associated with the lower lobe effusions. Within these limitations, no pulmonary emboli identified. Mediastinum/Nodes: No adenopathy in the chest. The esophagus is normal. No pericardial effusion. Lungs/Pleura: Bilateral moderate-sized pleural effusions with atelectasis are identified, much larger in the 2 day interval. Mild interlobular septal thickening. Tiny ground-glass nodule in the anterior left lung measuring 3 mm on series 6, image 35. No other abnormalities within the lung parenchyma. Scattered atelectasis. Central airways are unremarkable. No pneumothorax. Upper Abdomen: Limited views of the upper abdomen demonstrated a few mildly prominent porta hepatis nodes such as on series 4, image 90 measuring 11 mm in short axis. No other abnormalities. Musculoskeletal: Degenerative changes in the spine. Review of the MIP images confirms the above findings. IMPRESSION: 1. Moderate bilateral pleural effusions, significantly larger in the 2 day interval. Interlobular septal thickening. Volume overload should be considered. However, the heart is borderline but not grossly enlarged. Recommend clinical correlation. 2. No pulmonary emboli identified. 3. 3 mm ground-glass nodule in the left lung. No follow-up recommended. This recommendation follows the consensus statement: Guidelines for Management of Incidental Pulmonary Nodules Detected on CT Images: From the Fleischner Society 2017; Radiology 2017; 284:228-243. 4. Mildly prominent porta hepatis nodes measuring up to 11 mm. These are nonspecific and incompletely evaluated. Electronically Signed   By: Gerome Samavid  Williams III M.D   On: 03/18/2016 15:53   ASSESSMENT AND PLAN:   80 y.o.femalewith a known history of HTN, HLD DM, OA presents to the emergency department complaining of cough and shortness of breath.  -Patient was in a usual state of health until About 2 weeks ago when she developed a chest cold as she describes it with chest congestion -  nonproductive cough, subjective fevers  1. Acute hypoxic respiratory failure due to Pleural effusion and pulmonary edema,new onset ans asthma exacerbation -CT chest negative for PE, has bilateral effusions and pulm edema -appreciate Dr Clovis FredricksonKasa's input -IV lasix bid and solumederol, nebs, BiPAP and inhalers -Echo pending -seen by dr Welton Flakeskhan  2. Fever with sepsis and possible pneumonia -cont IV rocephin and zithromax  3. Tachycardia due to 1 and 2  4.HTN  5. DVT prophylaxis on lovenox  6. DM_2 cont meds and SSI  D/w son mike on the phone Case discussed with Care Management/Social Worker. Management plans discussed with the patient, family and they are in agreement.  CODE STATUS:full  DVT Prophylaxis: lovenox  TOTAL critical TIME TAKING CARE OF THIS PATIENT: 40 minutes.  >50% time spent on counselling and coordination of care  POSSIBLE D/C IN 2-3 DAYS, DEPENDING ON CLINICAL CONDITION.  Note: This dictation was prepared with Dragon dictation along with smaller phrase technology. Any transcriptional errors that result from this process are unintentional.  Nkechi Linehan M.D on 03/18/2016 at 5:30 PM  Between 7am to 6pm - Pager - [phone]  After 6pm go to [email or web address] - password [email or web address] [facility]  [facility]  Office  [phone]  CC: Primary care physician; Roxy MannsMarne Tower, MD

## 2016-03-19 ENCOUNTER — Inpatient Hospital Stay: Payer: Medicare Other

## 2016-03-19 ENCOUNTER — Ambulatory Visit: Payer: Medicare Other | Admitting: Internal Medicine

## 2016-03-19 ENCOUNTER — Encounter: Payer: Self-pay | Admitting: Cardiovascular Disease

## 2016-03-19 ENCOUNTER — Encounter
Admission: EM | Disposition: A | Discharge status: Home / Self Care | Payer: Self-pay | Source: Home / Self Care | Attending: Internal Medicine

## 2016-03-19 DIAGNOSIS — I5021 Acute systolic (congestive) heart failure: Secondary | ICD-10-CM

## 2016-03-19 HISTORY — PX: CARDIAC CATHETERIZATION: SHX172

## 2016-03-19 LAB — BODY FLUID CELL COUNT WITH DIFFERENTIAL
Eos, Fluid: 0 %
Lymphs, Fluid: 34 %
Monocyte-Macrophage-Serous Fluid: 53 %
Neutrophil Count, Fluid: 13 %
Total Nucleated Cell Count, Fluid: 397 cu mm

## 2016-03-19 LAB — ECHOCARDIOGRAM COMPLETE
HEIGHTINCHES: 59 in
WEIGHTICAEL: 2240 [oz_av]

## 2016-03-19 LAB — PROTEIN, BODY FLUID

## 2016-03-19 LAB — LACTATE DEHYDROGENASE, PLEURAL OR PERITONEAL FLUID: LD, Fluid: 70 U/L — ABNORMAL HIGH (ref 3–23)

## 2016-03-19 LAB — PROCALCITONIN: PROCALCITONIN: 0.11 ng/mL

## 2016-03-19 LAB — GLUCOSE, CAPILLARY
GLUCOSE-CAPILLARY: 162 mg/dL — AB (ref 65–99)
GLUCOSE-CAPILLARY: 192 mg/dL — AB (ref 65–99)
GLUCOSE-CAPILLARY: 220 mg/dL — AB (ref 65–99)
Glucose-Capillary: 225 mg/dL — ABNORMAL HIGH (ref 65–99)

## 2016-03-19 LAB — MRSA PCR SCREENING: MRSA by PCR: NEGATIVE

## 2016-03-19 SURGERY — RIGHT/LEFT HEART CATH AND CORONARY ANGIOGRAPHY
Anesthesia: Moderate Sedation | Laterality: Right

## 2016-03-19 MED ORDER — SODIUM CHLORIDE 0.9% FLUSH
3.0000 mL | Freq: Two times a day (BID) | INTRAVENOUS | Status: DC
  Administered 2016-03-19 – 2016-03-20 (×3): 3 mL via INTRAVENOUS

## 2016-03-19 MED ORDER — IOPAMIDOL (ISOVUE-300) INJECTION 61%
INTRAVENOUS | Status: DC | PRN
  Administered 2016-03-19: 75 mL via INTRA_ARTERIAL

## 2016-03-19 MED ORDER — SODIUM CHLORIDE 0.9 % IV SOLN
250.0000 mL | INTRAVENOUS | Status: DC | PRN
Start: 2016-03-19 — End: 2016-03-21

## 2016-03-19 MED ORDER — MIDAZOLAM HCL 2 MG/2ML IJ SOLN
INTRAMUSCULAR | Status: DC | PRN
  Administered 2016-03-19: 2 mg via INTRAVENOUS

## 2016-03-19 MED ORDER — FENTANYL CITRATE (PF) 100 MCG/2ML IJ SOLN
INTRAMUSCULAR | Status: AC
  Filled 2016-03-19: qty 2

## 2016-03-19 MED ORDER — ONDANSETRON HCL 4 MG/2ML IJ SOLN
4.0000 mg | Freq: Four times a day (QID) | INTRAMUSCULAR | Status: DC | PRN
  Administered 2016-03-19: 4 mg via INTRAVENOUS

## 2016-03-19 MED ORDER — HEPARIN (PORCINE) IN NACL 2-0.9 UNIT/ML-% IJ SOLN
INTRAMUSCULAR | Status: AC
  Filled 2016-03-19: qty 500

## 2016-03-19 MED ORDER — SODIUM CHLORIDE 0.9 % WEIGHT BASED INFUSION: 1.0000 mL/kg/h | INTRAVENOUS | Status: DC

## 2016-03-19 MED ORDER — SODIUM CHLORIDE 0.9 % IV SOLN: 250.0000 mL | INTRAVENOUS | Status: DC | PRN

## 2016-03-19 MED ORDER — BUPIVACAINE HCL (PF) 0.5 % IJ SOLN
INTRAMUSCULAR | Status: AC
  Filled 2016-03-19: qty 30

## 2016-03-19 MED ORDER — TRAMADOL HCL 50 MG PO TABS: 50.0000 mg | ORAL_TABLET | Freq: Three times a day (TID) | ORAL | Status: DC | PRN

## 2016-03-19 MED ORDER — SODIUM CHLORIDE 0.9 % WEIGHT BASED INFUSION: 3.0000 mL/kg/h | INTRAVENOUS | Status: DC

## 2016-03-19 MED ORDER — SODIUM CHLORIDE 0.9% FLUSH: 3.0000 mL | Freq: Two times a day (BID) | INTRAVENOUS | Status: DC

## 2016-03-19 MED ORDER — FENTANYL CITRATE (PF) 100 MCG/2ML IJ SOLN
INTRAMUSCULAR | Status: DC | PRN
  Administered 2016-03-19: 25 ug via INTRAVENOUS

## 2016-03-19 MED ORDER — SODIUM CHLORIDE 0.45 % IV SOLN
INTRAVENOUS | Status: DC
  Administered 2016-03-19: 11:00:00 via INTRAVENOUS

## 2016-03-19 MED ORDER — DIGOXIN 250 MCG PO TABS
0.2500 mg | ORAL_TABLET | Freq: Every day | ORAL | Status: DC
  Administered 2016-03-19 – 2016-03-21 (×3): 0.25 mg via ORAL
  Filled 2016-03-19: qty 1
  Filled 2016-03-19: qty 2
  Filled 2016-03-19: qty 1

## 2016-03-19 MED ORDER — ACETAMINOPHEN 325 MG PO TABS: 650.0000 mg | ORAL_TABLET | ORAL | Status: DC | PRN

## 2016-03-19 MED ORDER — MIDAZOLAM HCL 2 MG/2ML IJ SOLN
INTRAMUSCULAR | Status: AC
  Filled 2016-03-19: qty 2

## 2016-03-19 MED ORDER — SODIUM CHLORIDE 0.9 % WEIGHT BASED INFUSION
1.0000 mL/kg/h | INTRAVENOUS | Status: AC
  Administered 2016-03-19: 1 mL/kg/h via INTRAVENOUS

## 2016-03-19 MED ORDER — SACUBITRIL-VALSARTAN 24-26 MG PO TABS: 1.0000 | ORAL_TABLET | Freq: Two times a day (BID) | ORAL | Status: DC

## 2016-03-19 MED ORDER — SODIUM CHLORIDE 0.9% FLUSH: 3.0000 mL | INTRAVENOUS | Status: DC | PRN

## 2016-03-19 MED ORDER — ALUM & MAG HYDROXIDE-SIMETH 200-200-20 MG/5ML PO SUSP: 10.0000 mL | Freq: Four times a day (QID) | ORAL | Status: DC | PRN

## 2016-03-19 MED ORDER — SPIRONOLACTONE 25 MG PO TABS
25.0000 mg | ORAL_TABLET | Freq: Every day | ORAL | Status: DC
  Administered 2016-03-19: 25 mg via ORAL
  Filled 2016-03-19: qty 1

## 2016-03-19 MED ORDER — CARVEDILOL 12.5 MG PO TABS
12.5000 mg | ORAL_TABLET | Freq: Two times a day (BID) | ORAL | Status: DC
  Administered 2016-03-19 – 2016-03-21 (×4): 12.5 mg via ORAL
  Filled 2016-03-19 (×4): qty 1

## 2016-03-19 MED ORDER — ASPIRIN 81 MG PO CHEW: 81.0000 mg | CHEWABLE_TABLET | ORAL | Status: DC

## 2016-03-19 SURGICAL SUPPLY — 12 items
CATH 5FR JL4 DIAGNOSTIC (CATHETERS) ×3 IMPLANT
CATH 5FR JR4 DIAGNOSTIC (CATHETERS) ×1 IMPLANT
CATH 5FR PIGTAIL DIAGNOSTIC (CATHETERS) ×1 IMPLANT
CATH SWANZ 7F THERMO (CATHETERS) ×2 IMPLANT
KIT MANI 3VAL PERCEP (MISCELLANEOUS) ×3 IMPLANT
KIT RIGHT HEART (MISCELLANEOUS) ×3 IMPLANT
NDL PERC 18GX7CM (NEEDLE) IMPLANT
NEEDLE PERC 18GX7CM (NEEDLE) ×3 IMPLANT
PACK CARDIAC CATH (CUSTOM PROCEDURE TRAY) ×3 IMPLANT
SHEATH PINNACLE 5F 10CM (SHEATH) ×2 IMPLANT
SHEATH PINNACLE 7F 10CM (SHEATH) ×2 IMPLANT
WIRE EMERALD 3MM-J .035X150CM (WIRE) ×3 IMPLANT

## 2016-03-19 NOTE — Progress Notes (Signed)
[facility] [place] at [facility]   PATIENT NAME: [name]    MR#:  [id number]  DATE OF BIRTH:  28-Dec-1935  SUBJECTIVE:  Breathing much improving. S/p left sided thoracentesis and heart cath today HR today in the 100's sats 100% on 2 liters  REVIEW OF SYSTEMS:   Review of Systems  Constitutional: Negative for chills, fever and weight loss.  HENT: Negative for ear discharge, ear pain and nosebleeds.   Eyes: Negative for blurred vision, pain and discharge.  Respiratory: Positive for shortness of breath. Negative for sputum production, wheezing and stridor.   Cardiovascular: Negative for chest pain, palpitations, orthopnea and PND.  Gastrointestinal: Positive for heartburn. Negative for abdominal pain, diarrhea, nausea and vomiting.  Genitourinary: Negative for frequency and urgency.  Musculoskeletal: Negative for back pain and joint pain.  Neurological: Positive for weakness. Negative for sensory change, speech change and focal weakness.  Psychiatric/Behavioral: Negative for depression and hallucinations. The patient is not nervous/anxious.    Tolerating Diet:yes Tolerating PT: pending  DRUG ALLERGIES:   Allergies  Allergen Reactions  . Acetaminophen     REACTION: nausea  . Codeine     REACTION: itching  . Fluticasone Propionate     REACTION: palpitations  . Fosamax [Alendronate Sodium]     Difficulty swallowing and fever/ joint pain   . Lidocaine Nausea And Vomiting    Increases heart rate, N/V per pt  . Naproxen     REACTION: GI  . Nsaids     REACTION: elevated LFT's  . Penicillins     REACTION: swelling at inj site    VITALS:  Blood pressure (!) 133/93, pulse (!) 104, temperature 98 F (36.7 C), temperature source Oral, resp. rate 17, height 4\' 11"  (1.499 m), weight 63.5 kg (140 lb), last menstrual period 04/22/1985, SpO2 100 %.  PHYSICAL EXAMINATION:   Physical Exam  GENERAL:  80 y.o.-year-old patient lying in the bed with  no acute distress.  EYES: Pupils equal, round, reactive to light and accommodation. No scleral icterus. Extraocular muscles intact.  HEENT: Head atraumatic, normocephalic. Oropharynx and nasopharynx clear.  NECK:  Supple, no jugular venous distention. No thyroid enlargement, no tenderness.  LUNGS: decreased breath sounds bilaterally, no wheezing, rales, rhonchi. No use of accessory muscles of respiration.   CARDIOVASCULAR: S1, S2 normal. No murmurs, rubs, or gallops. Mild tachy ABDOMEN: Soft, nontender, nondistended. Bowel sounds present. No organomegaly or mass.  EXTREMITIES: No cyanosis, clubbing or edema b/l.    NEUROLOGIC: Cranial nerves II through XII are intact. No focal Motor or sensory deficits b/l.   PSYCHIATRIC:  patient is alert and oriented x 3.  SKIN: No obvious rash, lesion, or ulcer.   LABORATORY PANEL:  CBC  Recent Labs Lab 03/17/16 0729  WBC 7.6  HGB 9.8*  HCT 29.2*  PLT 184    Chemistries   Recent Labs Lab 03/16/16 1842 03/17/16 0729  NA 136 139  K 4.0 4.0  CL 105 110  CO2 22 23  GLUCOSE 197* 163*  BUN 11 10  CREATININE 1.35* 1.02*  CALCIUM 9.6 9.2  AST 39  --   ALT 26  --   ALKPHOS 49  --   BILITOT 0.5  --    Cardiac Enzymes  Recent Labs Lab 03/16/16 1842  TROPONINI <0.03   RADIOLOGY:  Dg Chest 1 View  Result Date: 03/19/2016 CLINICAL DATA:  Status post thoracentesis EXAM: CHEST 1 VIEW COMPARISON:  March 19, 2016 study obtained earlier in the  day FINDINGS: There is no evident pneumothorax. There is a small pleural effusion on the right. There is patchy consolidation in both medial lung bases as well as patchy atelectasis in both mid and lower lung zones. Heart is upper normal in size with pulmonary vascularity within normal limits. There is atherosclerotic calcification in the aorta. No adenopathy. There is degenerative change in the thoracic spine. IMPRESSION: No evident pneumothorax. Patchy consolidation in each medial lung base,  concerning for pneumonia. Patchy atelectasis bilaterally. Small residual right pleural effusion. Stable cardiac prominence. Aortic atherosclerosis. Electronically Signed   By: Bretta Bang III M.D.   On: 03/19/2016 09:40   Ct Angio Chest Pe W Or Wo Contrast  Result Date: 03/18/2016 CLINICAL DATA:  Cough and shortness of breath. Acute hypoxic respiratory failure. EXAM: CT ANGIOGRAPHY CHEST WITH CONTRAST TECHNIQUE: Multidetector CT imaging of the chest was performed using the standard protocol during bolus administration of intravenous contrast. Multiplanar CT image reconstructions and MIPs were obtained to evaluate the vascular anatomy. CONTRAST:  75 mL of Isovue 370 COMPARISON:  Chest x-ray March 16, 2016 FINDINGS: Cardiovascular: Coronary artery calcifications identified. The heart size is borderline but not grossly enlarged. The thoracic aorta is normal in caliber. The aorta is poorly enhanced but no evidence of dissection is identified. Evaluation of the lower lobe pulmonary arteries is limited due to significant compressive atelectasis associated with the lower lobe effusions. Within these limitations, no pulmonary emboli identified. Mediastinum/Nodes: No adenopathy in the chest. The esophagus is normal. No pericardial effusion. Lungs/Pleura: Bilateral moderate-sized pleural effusions with atelectasis are identified, much larger in the 2 day interval. Mild interlobular septal thickening. Tiny ground-glass nodule in the anterior left lung measuring 3 mm on series 6, image 35. No other abnormalities within the lung parenchyma. Scattered atelectasis. Central airways are unremarkable. No pneumothorax. Upper Abdomen: Limited views of the upper abdomen demonstrated a few mildly prominent porta hepatis nodes such as on series 4, image 90 measuring 11 mm in short axis. No other abnormalities. Musculoskeletal: Degenerative changes in the spine. Review of the MIP images confirms the above findings.  IMPRESSION: 1. Moderate bilateral pleural effusions, significantly larger in the 2 day interval. Interlobular septal thickening. Volume overload should be considered. However, the heart is borderline but not grossly enlarged. Recommend clinical correlation. 2. No pulmonary emboli identified. 3. 3 mm ground-glass nodule in the left lung. No follow-up recommended. This recommendation follows the consensus statement: Guidelines for Management of Incidental Pulmonary Nodules Detected on CT Images: From the Fleischner Society 2017; Radiology 2017; 284:228-243. 4. Mildly prominent porta hepatis nodes measuring up to 11 mm. These are nonspecific and incompletely evaluated. Electronically Signed   By: Gerome Sam III M.D   On: 03/18/2016 15:53   Dg Chest Port 1 View  Result Date: 03/19/2016 CLINICAL DATA:  Pleural effusion EXAM: PORTABLE CHEST 1 VIEW COMPARISON:  03/16/2016 FINDINGS: Small bilateral pleural effusions. Bibasilar opacities, likely atelectasis. Heart is upper limits normal in size. No acute bony abnormality. IMPRESSION: Small bilateral pleural effusions with bibasilar atelectasis. Electronically Signed   By: Charlett Nose M.D.   On: 03/19/2016 07:23   US Thoracentesis Asp Pleural Space W/img Guide  Result Date: 03/19/2016 INDICATION: 80 year old female with shortness of breath and heart disease. Bilateral pleural effusions. Request for a thoracentesis. EXAM: ULTRASOUND GUIDED LEFT THORACENTESIS MEDICATIONS: None. COMPLICATIONS: None immediate. PROCEDURE: An ultrasound guided thoracentesis was thoroughly discussed with the patient and questions answered. The benefits, risks, alternatives and complications were also discussed. The patient understands and  wishes to proceed with the procedure. Written consent was obtained. Ultrasound was performed to localize and mark an adequate pocket of fluid in the left chest. Patient has allergies to local anesthetics. Therefore, the marked area was numbed using  an ice pack prior to the puncture. The area was then prepped and draped in the normal sterile fashion. Under ultrasound guidance a 6 Fr Safe-T-Centesis catheter was introduced. Thoracentesis was performed. The catheter was removed and a dressing applied. FINDINGS: A total of approximately 600 mL of yellow fluid was removed. Samples were sent to the laboratory as requested by the clinical team. IMPRESSION: Successful ultrasound guided left thoracentesis yielding 600 mL of pleural fluid. Electronically Signed   By: Richarda OverlieAdam  Henn M.D.   On: 03/19/2016 10:39   ASSESSMENT AND PLAN:   80 y.o.femalewith a known history of HTN, HLD DM, OA presents to the emergency department complaining of cough and shortness of breath.  Patient was in a usual state of health until About 2 weeks ago when she developed a chest cold as she describes it with chest congestion nonproductive cough  1. Acute hypoxic respiratory failure due to Pleural effusion and pulmonary edema,new onset and asthma exacerbation-Acute systolic CHF with severe Idiopathic cardiomyopathy -s/p left side US guided thoracentesis with 600 cc removed -CT chest negative for PE, has bilateral effusions and pulm edema -appreciate Dr Clovis FredricksonKasa's input -IV lasix bid, po spironolactone, ace, coreg per dr Welton Flakeskhan --Echo showed EF 10% with severe MR and severe Pulm HTN -Cath on 03/19/16 showed insignificant CAD and severe pulm HTN with EF 10% -seen by dr Welton Flakeskhan  2.Acute sob due to cardiac issues -wbc normal, no fever -D/c abxs and solumederol -cont inhalers and prn nebs  3. Tachycardia due to 1 and 2  4.HTN -coreg and lisinopril  5. DVT prophylaxis on lovenox  6. DM_2 cont meds and SSI  D/w son mike on the phone Spoke with pt's boyfriend Case discussed with Care Management/Social Worker. Management plans discussed with the patient, family and they are in agreement.  CODE STATUS:full  DVT Prophylaxis: lovenox  TOTAL critical TIME TAKING CARE OF THIS  PATIENT: 30 minutes.  >50% time spent on counselling and coordination of care  POSSIBLE D/C IN 2-3 DAYS, DEPENDING ON CLINICAL CONDITION.  Note: This dictation was prepared with Dragon dictation along with smaller phrase technology. Any transcriptional errors that result from this process are unintentional.  Afifa Truax M.D on 03/19/2016 at 5:38 PM  Between 7am to 6pm - Pager - [phone]  After 6pm go to [email or web address] - password [email or web address] [facility]  [facility]  Office  [phone]  CC: Primary care physician; Roxy MannsMarne Tower, MD

## 2016-03-19 NOTE — Progress Notes (Signed)
SUBJECTIVE: Patient is having thoracocentesis and is still short of breath   Vitals:   03/19/16 0700 03/19/16 0800 03/19/16 0805 03/19/16 0835  BP: (!) 100/56  128/68 129/67  Pulse: 82  97 98  Resp: 14  17 20   Temp:  97.6 F (36.4 C)    TempSrc:  Oral    SpO2: 99%  98% 96%  Weight:      Height:        Intake/Output Summary (Last 24 hours) at 03/19/16 0858 Last data filed at 03/19/16 0800  Gross per 24 hour  Intake            227.5 ml  Output             3250 ml  Net          -3022.5 ml    LABS: Basic Metabolic Panel:  Recent Labs  16/01/9610/25/17 1842 03/17/16 0729  NA 136 139  K 4.0 4.0  CL 105 110  CO2 22 23  GLUCOSE 197* 163*  BUN 11 10  CREATININE 1.35* 1.02*  CALCIUM 9.6 9.2   Liver Function Tests:  Recent Labs  03/16/16 1842  AST 39  ALT 26  ALKPHOS 49  BILITOT 0.5  PROT 7.3  ALBUMIN 3.8   No results for input(s): LIPASE, AMYLASE in the last 72 hours. CBC:  Recent Labs  03/16/16 1842 03/17/16 0729  WBC 8.2 7.6  HGB 11.2* 9.8*  HCT 32.6* 29.2*  MCV 89.1 90.4  PLT 244 184   Cardiac Enzymes:  Recent Labs  03/16/16 1842  TROPONINI <0.03   BNP: Invalid input(s): POCBNP D-Dimer: No results for input(s): DDIMER in the last 72 hours. Hemoglobin A1C: No results for input(s): HGBA1C in the last 72 hours. Fasting Lipid Panel: No results for input(s): CHOL, HDL, LDLCALC, TRIG, CHOLHDL, LDLDIRECT in the last 72 hours. Thyroid Function Tests: No results for input(s): TSH, T4TOTAL, T3FREE, THYROIDAB in the last 72 hours.  Invalid input(s): FREET3 Anemia Panel: No results for input(s): VITAMINB12, FOLATE, FERRITIN, TIBC, IRON, RETICCTPCT in the last 72 hours.   PHYSICAL EXAM General: Well developed, well nourished, in no acute distress HEENT:  Normocephalic and atramatic Neck:  No JVD.  Lungs: Clear bilaterally to auscultation and percussion. Heart: HRRR . Normal S1 and S2 without gallops or murmurs.  Abdomen: Bowel sounds are positive,  abdomen soft and non-tender  Msk:  Back normal, normal gait. Normal strength and tone for age. Extremities: No clubbing, cyanosis or edema.   Neuro: Alert and oriented X 3. Psych:  Good affect, responds appropriately  TELEMETRY: Sinus rhythm  ASSESSMENT AND PLAN: Patient has left pleural effusion with left ventricular ejection fraction 10% and severe mitral regurgitation. Patient had sinus tachycardia probably due to cardiomyopathy and severe MR and left pleural effusion is probably related to congestive heart failure. Discussed with Dr. Allena KatzPatel and will do a right and left heart catheterization.  Active Problems:   Community acquired pneumonia    Laurier NancyKHAN,Diandra Cimini A, MD, [facility] 03/19/2016 8:58 AM

## 2016-03-19 NOTE — Progress Notes (Signed)
RN spoke with Dr. Belia HemanKasa and asked MD about patient traveling to [place] for thoracentesis without RN and MD gave order that patient could go without RN.

## 2016-03-19 NOTE — Procedures (Signed)
US guided left thoracentesis.  Removed 600 ml of yellow fluid.  No immediate complication.  CXR pending.

## 2016-03-19 NOTE — Progress Notes (Signed)
Patient transferred to cath lab by bed with Marijean NiemannJaime, orderly.  Report called to Jasmine DecemberSharon, RN prior to transport.  Patient A&Ox4 with no distress noted when leaving ICU.  Son and boyfriend going to cath lab waiting room.

## 2016-03-19 NOTE — Consult Note (Signed)
[facility] Pulmonary Medicine Consultation      Date: 03/19/2016,   MRN# [id number] [name] [birth year] Code Status:     Code Status Orders        Start     Ordered   03/16/16 2304  Full code  Continuous     03/16/16 2303    Code Status History    Date Active Date Inactive Code Status Order ID Comments User Context   This patient has a current code status but no historical code status.     Hosp day:@LENGTHOFSTAYDAYS @ Referring MD: @ATDPROV @     PCP:      AdmissionWeight: 140 lb (63.5 kg)                 CurrentWeight: 140 lb (63.5 kg) [name] is a 80 y.o. old female seen in consultation for resp distress at the request of Dr. Allena Katz.     CHIEF COMPLAINT:   SOB   HISTORY OF PRESENT ILLNESS     Patient denies fevers/chills (currently), weakness, dizziness, chest pain, , N/V/C/D, abdominal pain, dysuria/frequency, changes in mental status.   [facility] PCCM consulted for acute SOB, patient placed on biPAP Acute resp distress   CT chest shows extensive Pulm edema and Pleural effusions Plan for Thoracentesis today  biPAP last night, 3 l  now  I was coPlannulted for p   Current Medication:  Current Facility-Administered Medications:  .  ALPRAZolam (XANAX) tablet 0.25 mg, 0.25 mg, Oral, BID PRN, Enedina Finner, MD, 0.25 mg at 03/18/16 1330 .  amitriptyline (ELAVIL) tablet 25 mg, 25 mg, Oral, QHS, Alexis Hugelmeyer, DO, 25 mg at 03/18/16 2226 .  azithromycin [facility]) tablet 500 mg, 500 mg, Oral, q1800, Cindi Carbon, RPH, 500 mg at 03/18/16 1712 .  bisacodyl (DULCOLAX) EC tablet 5 mg, 5 mg, Oral, Daily PRN, Alexis Hugelmeyer, DO .  calcium-vitamin D (OSCAL WITH D) 500-200 MG-UNIT per tablet 1 tablet, 1 tablet, Oral, Daily, Alexis Hugelmeyer, DO, 1 tablet at 03/18/16 0908 .  cefTRIAXone (ROCEPHIN) IVPB 1 g, 1 g, Intravenous, Q24H, Alexis Hugelmeyer, DO, 1 g at 03/18/16 2126 .  chlorhexidine (PERIDEX) 0.12 % solution 15 mL, 15 mL, Mouth Rinse,  BID, Shane Crutch, MD .  guaiFENesin (MUCINEX) 12 hr tablet 600 mg, 600 mg, Oral, BID, 600 mg at 03/18/16 2226 **AND** dextromethorphan (DELSYM) 30 MG/5ML liquid 30 mg, 30 mg, Oral, BID, Alexis Hugelmeyer, DO, 30 mg at 03/18/16 2229 .  enoxaparin (LOVENOX) injection 40 mg, 40 mg, Subcutaneous, QHS, Alexis Hugelmeyer, DO, 40 mg at 03/18/16 2227 .  furosemide (LASIX) injection 20 mg, 20 mg, Intravenous, Q12H, Enedina Finner, MD, 20 mg at 03/19/16 0618 .  insulin aspart (novoLOG) injection 0-15 Units, 0-15 Units, Subcutaneous, TID WC, Alexis Hugelmeyer, DO, 5 Units at 03/19/16 0801 .  insulin aspart (novoLOG) injection 0-5 Units, 0-5 Units, Subcutaneous, QHS, Alexis Hugelmeyer, DO, 2 Units at 03/18/16 2240 .  ipratropium-albuterol (DUONEB) 0.5-2.5 (3) MG/3ML nebulizer solution 3 mL, 3 mL, Nebulization, Q4H, Erin Fulling, MD, 3 mL at 03/18/16 1612 .  lisinopril (PRINIVIL,ZESTRIL) tablet 40 mg, 40 mg, Oral, QHS, Alexis Hugelmeyer, DO, 40 mg at 03/18/16 2225 .  magnesium citrate solution 1 Bottle, 1 Bottle, Oral, Once PRN, Alexis Hugelmeyer, DO .  meclizine (ANTIVERT) tablet 25 mg, 25 mg, Oral, BID PRN, Alexis Hugelmeyer, DO .  MEDLINE mouth rinse, 15 mL, Mouth Rinse, q12n4p, Shane Crutch, MD, 15 mL at 03/18/16 1600 .  methylPREDNISolone sodium succinate (SOLU-MEDROL) 40 mg/mL injection 40  mg, 40 mg, Intravenous, Q12H, Erin FullingKurian Rosalynd Mcwright, MD, 40 mg at 03/19/16 0243 .  metoprolol tartrate (LOPRESSOR) tablet 25 mg, 25 mg, Oral, BID, Alexis Hugelmeyer, DO, 25 mg at 03/18/16 2238 .  morphine 4 MG/ML injection 0.52 mg, 0.52 mg, Intravenous, TID PRN, Enedina FinnerSona Patel, MD .  multivitamin with minerals tablet 1 tablet, 1 tablet, Oral, Daily, Alexis Hugelmeyer, DO, 1 tablet at 03/18/16 0908 .  omega-3 acid ethyl esters (LOVAZA) capsule 1 g, 1 capsule, Oral, BID, Alexis Hugelmeyer, DO, 1 g at 03/18/16 2226 .  ondansetron (ZOFRAN) tablet 4 mg, 4 mg, Oral, Q6H PRN **OR** ondansetron (ZOFRAN) injection 4 mg, 4 mg,  Intravenous, Q6H PRN, Alexis Hugelmeyer, DO .  pantoprazole (PROTONIX) EC tablet 40 mg, 40 mg, Oral, QAC breakfast, Alexis Hugelmeyer, DO, 40 mg at 03/19/16 0803 .  senna-docusate (Senokot-S) tablet 1 tablet, 1 tablet, Oral, QHS PRN, Alexis Hugelmeyer, DO .  zolpidem (AMBIEN) tablet 5 mg, 5 mg, Oral, QHS PRN, Alexis Hugelmeyer, DO, 5 mg at 03/18/16 2239    ALLERGIES   Acetaminophen; Codeine; Fluticasone propionate; Fosamax [alendronate sodium]; Naproxen; Nsaids; and Penicillins     REVIEW OF SYSTEMS   Review of Systems  Constitutional: Positive for malaise/fatigue. Negative for chills, diaphoresis, fever and weight loss.  HENT: Negative for congestion and hearing loss.   Eyes: Negative for blurred vision and double vision.  Respiratory: Positive for cough, shortness of breath and wheezing. Negative for hemoptysis and sputum production.   Cardiovascular: Negative for chest pain, palpitations, orthopnea and leg swelling.  Gastrointestinal: Negative for abdominal pain, heartburn, nausea and vomiting.  Musculoskeletal: Negative for neck pain.  Skin: Negative for rash.  Neurological: Negative for weakness.  All other systems reviewed and are negative.    VS: BP 128/68   Pulse 97   Temp 97.6 F (36.4 C) (Oral)   Resp 17   Ht 4\' 11"  (1.499 m)   Wt 140 lb (63.5 kg)   LMP 04/22/1985   SpO2 98%   BMI 28.28 kg/m      PHYSICAL EXAM  Physical Exam  Constitutional: She is oriented to person, place, and time. She appears well-developed and well-nourished. No distress.  Eyes: EOM are normal. No scleral icterus.  Neck: Neck supple.  Cardiovascular: Normal rate, regular rhythm and normal heart sounds.   No murmur heard. Pulmonary/Chest: No stridor. No respiratory distress. She has no wheezes. She has rales.  Abdominal: Soft. Bowel sounds are normal.  Musculoskeletal: Normal range of motion. She exhibits no edema.  Neurological: She is alert and oriented to person, place, and time.  No cranial nerve deficit.  Skin: Skin is warm. She is not diaphoretic.  Psychiatric: She has a normal mood and affect.        LABS    Recent Labs     03/16/16  1842  03/17/16  0729  HGB  11.2*  9.8*  HCT  32.6*  29.2*  MCV  89.1  90.4  WBC  8.2  7.6  BUN  11  10  CREATININE  1.35*  1.02*  GLUCOSE  197*  163*  CALCIUM  9.6  9.2  ,    No results for input(s): PH in the last 72 hours.  Invalid input(s): PCO2, PO2, BASEEXCESS, BASEDEFICITE, TFT    CULTURE RESULTS   Recent Results (from the past 240 hour(s))  Blood culture (routine x 2)     Status: None (Preliminary result)   Collection Time: 03/16/16  9:52 PM  Result Value Ref Range  Status   Specimen Description BLOOD RIGHT ASSIST CONTROL  Final   Special Requests BOTTLES DRAWN AEROBIC AND ANAEROBIC 7CCAERO,8CCANA  Final   Culture NO GROWTH 3 DAYS  Final   Report Status PENDING  Incomplete  Blood culture (routine x 2)     Status: None (Preliminary result)   Collection Time: 03/16/16  9:52 PM  Result Value Ref Range Status   Specimen Description BLOOD LEFT ASSIST CONTROL  Final   Special Requests   Final    BOTTLES DRAWN AEROBIC AND ANAEROBIC 11CCAERO,12CCANA   Culture NO GROWTH 3 DAYS  Final   Report Status PENDING  Incomplete          IMAGING    Dg Chest 2 View  Result Date: 03/16/2016 CLINICAL DATA:  Patient with a virus for multiple days. Fever and cough. EXAM: CHEST  2 VIEW COMPARISON:  None. FINDINGS: Normal cardiac and mediastinal contours. Heterogeneous opacities within the left mid and lower lung and right lower lung. Small bilateral pleural effusions. No pneumothorax. Mid thoracic spine degenerative changes. IMPRESSION: Heterogeneous opacities within the left mid and lower lung as well as right lower lung which are nonspecific given lack of prior exams. In the acute setting these are concerning for infectious process. Followup PA and lateral chest X-ray is recommended in 3-4 weeks following trial of  antibiotic therapy to ensure resolution and exclude underlying malignancy. Small bilateral pleural effusions. Electronically Signed   By: Annia Beltrew  Davis M.D.   On: 03/16/2016 19:26   Ct Angio Chest Pe W Or Wo Contrast  Result Date: 03/18/2016 CLINICAL DATA:  Cough and shortness of breath. Acute hypoxic respiratory failure. EXAM: CT ANGIOGRAPHY CHEST WITH CONTRAST TECHNIQUE: Multidetector CT imaging of the chest was performed using the standard protocol during bolus administration of intravenous contrast. Multiplanar CT image reconstructions and MIPs were obtained to evaluate the vascular anatomy. CONTRAST:  75 mL of Isovue 370 COMPARISON:  Chest x-ray March 16, 2016 FINDINGS: Cardiovascular: Coronary artery calcifications identified. The heart size is borderline but not grossly enlarged. The thoracic aorta is normal in caliber. The aorta is poorly enhanced but no evidence of dissection is identified. Evaluation of the lower lobe pulmonary arteries is limited due to significant compressive atelectasis associated with the lower lobe effusions. Within these limitations, no pulmonary emboli identified. Mediastinum/Nodes: No adenopathy in the chest. The esophagus is normal. No pericardial effusion. Lungs/Pleura: Bilateral moderate-sized pleural effusions with atelectasis are identified, much larger in the 2 day interval. Mild interlobular septal thickening. Tiny ground-glass nodule in the anterior left lung measuring 3 mm on series 6, image 35. No other abnormalities within the lung parenchyma. Scattered atelectasis. Central airways are unremarkable. No pneumothorax. Upper Abdomen: Limited views of the upper abdomen demonstrated a few mildly prominent porta hepatis nodes such as on series 4, image 90 measuring 11 mm in short axis. No other abnormalities. Musculoskeletal: Degenerative changes in the spine. Review of the MIP images confirms the above findings. IMPRESSION: 1. Moderate bilateral pleural effusions,  significantly larger in the 2 day interval. Interlobular septal thickening. Volume overload should be considered. However, the heart is borderline but not grossly enlarged. Recommend clinical correlation. 2. No pulmonary emboli identified. 3. 3 mm ground-glass nodule in the left lung. No follow-up recommended. This recommendation follows the consensus statement: Guidelines for Management of Incidental Pulmonary Nodules Detected on CT Images: From the Fleischner Society 2017; Radiology 2017; 284:228-243. 4. Mildly prominent porta hepatis nodes measuring up to 11 mm. These are nonspecific and incompletely evaluated.  Electronically Signed   By: Gerome Sam III M.D   On: 03/18/2016 15:53   Dg Chest Port 1 View  Result Date: 03/19/2016 CLINICAL DATA:  Pleural effusion EXAM: PORTABLE CHEST 1 VIEW COMPARISON:  03/16/2016 FINDINGS: Small bilateral pleural effusions. Bibasilar opacities, likely atelectasis. Heart is upper limits normal in size. No acute bony abnormality. IMPRESSION: Small bilateral pleural effusions with bibasilar atelectasis. Electronically Signed   By: Charlett Nose M.D.   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 Pro calcitonin levels WNL stop ABX 5.wean off BiPAP and oxygen as needed 6.plan for US guided Thoracentesis today via IR  I have personally obtained a history, examined the patient, evaluated laboratory and independently reviewed imaging results, formulated the assessment and plan and placed orders.  The Patient requires high complexity decision making for  assessment and support, frequent evaluation and titration of therapies, application of advanced monitoring technologies and extensive interpretation of multiple databases.   Patient/Family are satisfied with Plan of action and management. All questions answered  Lucie Leather, M.D.  [facility]  Medical Director [facility] Medical Director [facility] Cardio-Pulmonary Department

## 2016-03-19 NOTE — Progress Notes (Signed)
[facility].  Check right groin for bleeding or hematoma.  Patient will be on bedrest for 2 hours post sheath pull---out of bed at 15:30.  Bilateral pulses are 2's DP's

## 2016-03-19 NOTE — Progress Notes (Signed)
Report called to accepting nurse on 2A.  Pt is alert and oriented.  No complaints of pain.  Foley remains in place.  Right fem. Site shows no signs of bleeding, no hematoma.  Left back has bandaid in place, no signs of bleeding noted.

## 2016-03-19 NOTE — Progress Notes (Signed)
Pt has remained alert and oriented.  Pt is sinus rhythm with a bundle branch block on the cardiac monitor.  Foley remains in place with more than adequate urine output.  Pt placed on bipap 28% while she slept.  Pt now on nasal canula at 3L, sats are in the 90's.  Pt rested comfortably during the night.

## 2016-03-19 NOTE — Progress Notes (Signed)
Inpatient Diabetes Program Recommendations  AACE/ADA: New Consensus Statement on Inpatient Glycemic Control (2015)  Target Ranges:  Prepandial:   less than 140 mg/dL      Peak postprandial:   less than 180 mg/dL (1-2 hours)      Critically ill patients:  140 - 180 mg/dL   Results for [name], [name] (MRN [id number]) as of 03/19/2016 08:04  Ref. Range 03/18/2016 07:20 03/18/2016 11:12 03/18/2016 16:23 03/18/2016 21:46  Glucose-Capillary Latest Ref Range: 65 - 99 mg/dL 83 474293 (H) 259110 (H) 563225 (H)   Results for [name], [name] (MRN [id number]) as of 03/19/2016 08:04  Ref. Range 03/19/2016 07:39  Glucose-Capillary Latest Ref Range: 65 - 99 mg/dL 518225 (H)    Admit with: SOB/ Cough  History: DM2  Home DM Meds: Glipizide 5 mg daily       Metformin 1000 mg AM/ 500 mg Lunch/ 500 mg Dinner  Current Insulin Orders: Novolog Moderate Correction Scale/ SSI (0-15 units) TID AC + HS        -Note patient given 125 mg Solumedrol yesterday at 2pm and then started on Solumedrol 40 mg BID.  -Glucose levels elevated this AM likely due to IV steroids.     MD- If patient remains on IV steroids and continues to have elevated fasting glucose levels, please consider starting low dose basal insulin:  Lantus 9 units daily (0.15 units/kg dosing based on weight of 63 kg)     --Will follow patient during hospitalization--  Ambrose FinlandJeannine Johnston Khamani Daniely RN, MSN, CDE Diabetes Coordinator Inpatient Glycemic Control Team Team Pager: [phone] (8a-5p)

## 2016-03-19 NOTE — Progress Notes (Signed)
Report given to Adelina MingsKelsey, RN who will take over patient's care when she returns from cath lab.

## 2016-03-19 NOTE — Progress Notes (Signed)
RN spoke with Dr. Welton FlakesKhan and asked about pre cath IVF.  MD gave order for 1/2 NS @ 75cc/H.

## 2016-03-20 ENCOUNTER — Telehealth: Payer: Self-pay

## 2016-03-20 ENCOUNTER — Encounter: Payer: Medicare Other | Admitting: Family Medicine

## 2016-03-20 LAB — GLUCOSE, CAPILLARY
GLUCOSE-CAPILLARY: 170 mg/dL — AB (ref 65–99)
GLUCOSE-CAPILLARY: 298 mg/dL — AB (ref 65–99)
GLUCOSE-CAPILLARY: 122 mg/dL — AB (ref 65–99)
GLUCOSE-CAPILLARY: 265 mg/dL — AB (ref 65–99)

## 2016-03-20 LAB — BASIC METABOLIC PANEL
Anion gap: 12 (ref 5–15)
BUN: 19 mg/dL (ref 6–20)
CHLORIDE: 99 mmol/L — AB (ref 101–111)
CO2: 25 mmol/L (ref 22–32)
CREATININE: 1.36 mg/dL — AB (ref 0.44–1.00)
Calcium: 9.1 mg/dL (ref 8.9–10.3)
GFR calc non Af Amer: 36 mL/min — ABNORMAL LOW (ref >60)
GFR, EST AFRICAN AMERICAN: 42 mL/min — AB (ref >60)
Glucose, Bld: 159 mg/dL — ABNORMAL HIGH (ref 65–99)
POTASSIUM: 3.1 mmol/L — AB (ref 3.5–5.1)
Sodium: 136 mmol/L (ref 135–145)

## 2016-03-20 LAB — MISC LABCORP TEST (SEND OUT): Labcorp test code: 19588

## 2016-03-20 LAB — PROCALCITONIN: Procalcitonin: <0.1 ng/mL

## 2016-03-20 LAB — LACTATE DEHYDROGENASE: LDH: 182 U/L (ref 98–192)

## 2016-03-20 LAB — MAGNESIUM: Magnesium: 1.4 mg/dL — ABNORMAL LOW (ref 1.7–2.4)

## 2016-03-20 MED ORDER — ALUM & MAG HYDROXIDE-SIMETH 200-200-20 MG/5ML PO SUSP
15.0000 mL | Freq: Once | ORAL | Status: AC
  Administered 2016-03-20: 15 mL via ORAL
  Filled 2016-03-20: qty 30

## 2016-03-20 MED ORDER — FUROSEMIDE 20 MG PO TABS
20.0000 mg | ORAL_TABLET | Freq: Every day | ORAL | Status: DC
  Administered 2016-03-20 – 2016-03-21 (×2): 20 mg via ORAL
  Filled 2016-03-20 (×2): qty 1

## 2016-03-20 MED ORDER — NITROGLYCERIN 0.4 MG SL SUBL
0.4000 mg | SUBLINGUAL_TABLET | SUBLINGUAL | Status: DC | PRN
  Administered 2016-03-20: 0.4 mg via SUBLINGUAL
  Filled 2016-03-20: qty 1

## 2016-03-20 MED ORDER — ISOSORBIDE MONONITRATE ER 30 MG PO TB24
30.0000 mg | ORAL_TABLET | Freq: Every day | ORAL | Status: DC
  Administered 2016-03-20 – 2016-03-21 (×2): 30 mg via ORAL
  Filled 2016-03-20 (×2): qty 1

## 2016-03-20 MED ORDER — FUROSEMIDE 20 MG PO TABS: 20.0000 mg | ORAL_TABLET | Freq: Every day | ORAL | Status: DC

## 2016-03-20 MED ORDER — MAGNESIUM SULFATE 2 GM/50ML IV SOLN
2.0000 g | Freq: Once | INTRAVENOUS | Status: AC
  Administered 2016-03-20: 2 g via INTRAVENOUS
  Filled 2016-03-20: qty 50

## 2016-03-20 MED ORDER — NITROGLYCERIN 0.4 MG SL SUBL
SUBLINGUAL_TABLET | SUBLINGUAL | Status: AC
  Administered 2016-03-20: 0.4 mg
  Filled 2016-03-20: qty 1

## 2016-03-20 MED ORDER — SPIRONOLACTONE 25 MG PO TABS
25.0000 mg | ORAL_TABLET | Freq: Every day | ORAL | Status: DC
  Administered 2016-03-20 – 2016-03-21 (×2): 25 mg via ORAL
  Filled 2016-03-20 (×2): qty 1

## 2016-03-20 MED ORDER — PANTOPRAZOLE SODIUM 40 MG PO TBEC
40.0000 mg | DELAYED_RELEASE_TABLET | Freq: Two times a day (BID) | ORAL | Status: DC
  Administered 2016-03-20 – 2016-03-21 (×2): 40 mg via ORAL
  Filled 2016-03-20 (×2): qty 1

## 2016-03-20 MED ORDER — POTASSIUM CHLORIDE CRYS ER 20 MEQ PO TBCR
40.0000 meq | EXTENDED_RELEASE_TABLET | Freq: Once | ORAL | Status: AC
  Administered 2016-03-20: 40 meq via ORAL
  Filled 2016-03-20: qty 2

## 2016-03-20 MED ORDER — SACUBITRIL-VALSARTAN 24-26 MG PO TABS
1.0000 | ORAL_TABLET | Freq: Two times a day (BID) | ORAL | Status: DC
  Administered 2016-03-20 – 2016-03-21 (×3): 1 via ORAL
  Filled 2016-03-20 (×3): qty 1

## 2016-03-20 MED ORDER — ENOXAPARIN SODIUM 30 MG/0.3ML ~~LOC~~ SOLN
30.0000 mg | Freq: Every day | SUBCUTANEOUS | Status: DC
  Administered 2016-03-20: 30 mg via SUBCUTANEOUS
  Filled 2016-03-20: qty 0.3

## 2016-03-20 NOTE — Progress Notes (Signed)
Pr crcl is <5530ml/min, therefore lovenox was changed to 30mg  q 24hr per protocol.  Olene FlossMelissa D Wandalee Klang, Pharm.D Clinical Pharmacist

## 2016-03-20 NOTE — Care Management Important Message (Signed)
Important Message  Patient Details  Name: [name] MRN: [id number] Date of Birth: 1935/09/19   Medicare Important Message Given:  Yes    Eber HongGreene, Neve Branscomb R, RN 03/20/2016, 3:18 PM

## 2016-03-20 NOTE — Progress Notes (Addendum)
SUBJECTIVE: Pt feeling well at present. Had episode of chest pain described as burning across chest this morning that resolved with SL nitroglycerin X2. Currently no chest pain, shortness of breath, orthopnea or dizziness.   Vitals:   03/20/16 0645 03/20/16 0653 03/20/16 0700 03/20/16 0812  BP: (!) 147/70 104/60 (!) 106/52 133/60  Pulse: 98   91  Resp: 20     Temp: 98.4 F (36.9 C)     TempSrc: Oral     SpO2: 99%   97%  Weight:      Height:        Intake/Output Summary (Last 24 hours) at 03/20/16 0907 Last data filed at 03/20/16 0710  Gross per 24 hour  Intake          1515.05 ml  Output             3225 ml  Net         -1709.95 ml    LABS: Basic Metabolic Panel:  Recent Labs  16/01/9610/29/17 0540  NA 136  K 3.1*  CL 99*  CO2 25  GLUCOSE 159*  BUN 19  CREATININE 1.36*  CALCIUM 9.1   Liver Function Tests: No results for input(s): AST, ALT, ALKPHOS, BILITOT, PROT, ALBUMIN in the last 72 hours. No results for input(s): LIPASE, AMYLASE in the last 72 hours. CBC: No results for input(s): WBC, NEUTROABS, HGB, HCT, MCV, PLT in the last 72 hours. Cardiac Enzymes: No results for input(s): CKTOTAL, CKMB, CKMBINDEX, TROPONINI in the last 72 hours. BNP: Invalid input(s): POCBNP D-Dimer: No results for input(s): DDIMER in the last 72 hours. Hemoglobin A1C: No results for input(s): HGBA1C in the last 72 hours. Fasting Lipid Panel: No results for input(s): CHOL, HDL, LDLCALC, TRIG, CHOLHDL, LDLDIRECT in the last 72 hours. Thyroid Function Tests: No results for input(s): TSH, T4TOTAL, T3FREE, THYROIDAB in the last 72 hours.  Invalid input(s): FREET3 Anemia Panel: No results for input(s): VITAMINB12, FOLATE, FERRITIN, TIBC, IRON, RETICCTPCT in the last 72 hours.   PHYSICAL EXAM General: Well developed, well nourished, in no acute distress HEENT:  Normocephalic and atramatic Neck:  No JVD.  Lungs: Clear bilaterally to auscultation and percussion. Heart: HRRR . Normal S1  and S2 without gallops or murmurs.  Abdomen: Bowel sounds are positive, abdomen soft and non-tender  Msk:  Back normal, normal gait. Normal strength and tone for age. Extremities: No clubbing, cyanosis or edema.   Neuro: Alert and oriented X 3. Psych:  Good affect, responds appropriately  TELEMETRY: Sinus rhythm, rates in the 70s and 80s  ASSESSMENT AND PLAN: Acute respiratory difficulty and found to have severe systolic dysfunction with EF 10% and severe pulmonary hypertension and severe mitral regurgitation. She also had bilateral pleural effusions of which the left was tapped for 600 mL. A cardiac cath was performed revealing only mild coronary artery disease and moderate functional mitral regurgitation. The patient is being treated with aggressive medical therapy for heart failure with beta blocker, Aldactone, digoxin, Lasix and Entresto. Heart rate has improved. Chest pain this morning is unclear whether cardiac in origin or related to GERD. Will add isosorbide and increase Protonix to twice daily. A LifeVest referral has been placed.  I have discussed heart failure with the patient including medical and lifestyle management and signs and symptoms to report. Her son was present in the room. We will referred to phase II cardiac rehabilitation on an outpatient basis. Patient is aware that she will need close follow-up and medication adherence and  she has been given an appointment in our office with Dr. Welton FlakesKhan for Monday at 10:00.  Active Problems:   Community acquired pneumonia    Berton BonJanine Aleka Twitty, NP 03/20/2016 9:07 AM

## 2016-03-20 NOTE — Progress Notes (Addendum)
Family Meeting Note  Advance Directive:yes  Today a meeting took place with the Patient.  The following clinical team members were present during this meeting:MD and RN  The following were discussed:Patient's diagnosis: , Patient's progosis: > 12 months and Goals for treatment: DNR  Additional follow-up to be provided: outpt palliative care at home  Time spent during discussion:30 minutes  Delfino LovettVipul Ernestine Rohman, MD

## 2016-03-20 NOTE — Progress Notes (Signed)
Initial Heart Failure Clinic appointment scheduled on April 05, 2016 at 10:00am. Thank you for the referral.

## 2016-03-20 NOTE — Care Management (Signed)
Have initiated a [facility] referral and have confirmed receipt of referral information and followed up with note from today's cariology visit. Informed patient most likely will discharge on Entresto.  Will investigate need for prior auth for the medication.  Will anticipate need for home health nurse and palliative care in the home.  No agency preference.

## 2016-03-20 NOTE — Care Management (Signed)
Patient has declined any home health services and outpatient palliative care.  She is agreeable for CM to continue with [facility] Life Vest referral and assess whether Sherryll Burgerntresto will require prior authorization

## 2016-03-20 NOTE — Progress Notes (Signed)
New referral for Home palliative services received from  [facility] Ermalene SearingNann Greene. [facility] referral made aware. Thank you. Dayna BarkerKaren Robertson RN, BSN, [facility], [facility] Liaison [phone] c

## 2016-03-20 NOTE — Progress Notes (Addendum)
Patient complaining of chest pain. Pt reports it feels "like someone is sitting on my chest" Rates it 7/10. Nitroglycerin given x 2, and EKG performed. Patient reports that her pain was relieved with nitro, ekg unremarkable. VSS. MD notified. Awaiting page back.    Update 0713: MD Sherryll BurgerShah made aware. No new orders at this time.   Mayra NeerNesbitt, Glenda Spelman M

## 2016-03-20 NOTE — Progress Notes (Signed)
Patient with severe CHF exacerbation EF 10%  No other recs form Pulmonary point of view.  Will sign off. Please call back with any questions.    Lucie LeatherKurian David Mekiyah Gladwell, M.D.  [facility]  Medical Director [facility] Medical Director [facility] Cardio[facility] Department

## 2016-03-20 NOTE — Progress Notes (Signed)
[facility] [place] at [facility]   PATIENT NAME: [name]    MR#:  [id number]  DATE OF BIRTH:  [birth year]  SUBJECTIVE:  Earlier today complaining of chest pain. Pt reported it feels "like someone is sitting on my chest" Rates it 7/10. Nitroglycerin given x 2, and EKG performed. Patient reports that her pain was relieved with nitro, ekg unremarkable. VSS.  Feels fine at this time.  Potassium 3.1, creatinine 1.36 REVIEW OF SYSTEMS:   Review of Systems  Constitutional: Negative for chills, fever and weight loss.  HENT: Negative for ear discharge, ear pain and nosebleeds.   Eyes: Negative for blurred vision, pain and discharge.  Respiratory: Negative for sputum production, wheezing and stridor.   Cardiovascular: Positive for chest pain. Negative for palpitations, orthopnea and PND.  Gastrointestinal: Positive for heartburn. Negative for abdominal pain, diarrhea, nausea and vomiting.  Genitourinary: Negative for frequency and urgency.  Musculoskeletal: Negative for back pain and joint pain.  Neurological: Positive for weakness. Negative for sensory change, speech change and focal weakness.  Psychiatric/Behavioral: Negative for depression and hallucinations. The patient is not nervous/anxious.    Tolerating Diet:yes Tolerating PT: pending  DRUG ALLERGIES:   Allergies  Allergen Reactions  . Acetaminophen     REACTION: nausea  . Codeine     REACTION: itching  . Fluticasone Propionate     REACTION: palpitations  . Fosamax [Alendronate Sodium]     Difficulty swallowing and fever/ joint pain   . Lidocaine Nausea And Vomiting    Increases heart rate, N/V per pt  . Naproxen     REACTION: GI  . Nsaids     REACTION: elevated LFT's  . Penicillins     REACTION: swelling at inj site    VITALS:  Blood pressure 133/60, pulse 91, temperature 98.4 F (36.9 C), temperature source Oral, resp. rate 20, height 4\' 11"  (1.499 m), weight 61.1 kg (134 lb 12.8 oz),  last menstrual period 04/22/1985, SpO2 97 %. PHYSICAL EXAMINATION:   Physical Exam  GENERAL:  80 y.o.-year-old patient lying in the bed with no acute distress.  EYES: Pupils equal, round, reactive to light and accommodation. No scleral icterus. Extraocular muscles intact.  HEENT: Head atraumatic, normocephalic. Oropharynx and nasopharynx clear.  NECK:  Supple, no jugular venous distention. No thyroid enlargement, no tenderness.  LUNGS: decreased breath sounds bilaterally, no wheezing, rales, rhonchi. No use of accessory muscles of respiration.   CARDIOVASCULAR: S1, S2 normal. No murmurs, rubs, or gallops. Mild tachy ABDOMEN: Soft, nontender, nondistended. Bowel sounds present. No organomegaly or mass.  EXTREMITIES: No cyanosis, clubbing or edema b/l.    NEUROLOGIC: Cranial nerves II through XII are intact. No focal Motor or sensory deficits b/l.   PSYCHIATRIC:  patient is alert and oriented x 3.  SKIN: No obvious rash, lesion, or ulcer.  LABORATORY PANEL:  CBC  Recent Labs Lab 03/17/16 0729  WBC 7.6  HGB 9.8*  HCT 29.2*  PLT 184    Chemistries   Recent Labs Lab 03/16/16 1842  03/20/16 0540  NA 136  < > 136  K 4.0  < > 3.1*  CL 105  < > 99*  CO2 22  < > 25  GLUCOSE 197*  < > 159*  BUN 11  < > 19  CREATININE 1.35*  < > 1.36*  CALCIUM 9.6  < > 9.1  AST 39  --   --   ALT 26  --   --   ALKPHOS 49  --   --  BILITOT 0.5  --   --   < > = values in this interval not displayed. Cardiac Enzymes  Recent Labs Lab 03/16/16 1842  TROPONINI <0.03   RADIOLOGY:  Dg Chest 1 View  Result Date: 03/19/2016 CLINICAL DATA:  Status post thoracentesis EXAM: CHEST 1 VIEW COMPARISON:  March 19, 2016 study obtained earlier in the day FINDINGS: There is no evident pneumothorax. There is a small pleural effusion on the right. There is patchy consolidation in both medial lung bases as well as patchy atelectasis in both mid and lower lung zones. Heart is upper normal in size with  pulmonary vascularity within normal limits. There is atherosclerotic calcification in the aorta. No adenopathy. There is degenerative change in the thoracic spine. IMPRESSION: No evident pneumothorax. Patchy consolidation in each medial lung base, concerning for pneumonia. Patchy atelectasis bilaterally. Small residual right pleural effusion. Stable cardiac prominence. Aortic atherosclerosis. Electronically Signed   By: Bretta Bang III M.D.   On: 03/19/2016 09:40   Ct Angio Chest Pe W Or Wo Contrast  Result Date: 03/18/2016 CLINICAL DATA:  Cough and shortness of breath. Acute hypoxic respiratory failure. EXAM: CT ANGIOGRAPHY CHEST WITH CONTRAST TECHNIQUE: Multidetector CT imaging of the chest was performed using the standard protocol during bolus administration of intravenous contrast. Multiplanar CT image reconstructions and MIPs were obtained to evaluate the vascular anatomy. CONTRAST:  75 mL of Isovue 370 COMPARISON:  Chest x-ray March 16, 2016 FINDINGS: Cardiovascular: Coronary artery calcifications identified. The heart size is borderline but not grossly enlarged. The thoracic aorta is normal in caliber. The aorta is poorly enhanced but no evidence of dissection is identified. Evaluation of the lower lobe pulmonary arteries is limited due to significant compressive atelectasis associated with the lower lobe effusions. Within these limitations, no pulmonary emboli identified. Mediastinum/Nodes: No adenopathy in the chest. The esophagus is normal. No pericardial effusion. Lungs/Pleura: Bilateral moderate-sized pleural effusions with atelectasis are identified, much larger in the 2 day interval. Mild interlobular septal thickening. Tiny ground-glass nodule in the anterior left lung measuring 3 mm on series 6, image 35. No other abnormalities within the lung parenchyma. Scattered atelectasis. Central airways are unremarkable. No pneumothorax. Upper Abdomen: Limited views of the upper abdomen  demonstrated a few mildly prominent porta hepatis nodes such as on series 4, image 90 measuring 11 mm in short axis. No other abnormalities. Musculoskeletal: Degenerative changes in the spine. Review of the MIP images confirms the above findings. IMPRESSION: 1. Moderate bilateral pleural effusions, significantly larger in the 2 day interval. Interlobular septal thickening. Volume overload should be considered. However, the heart is borderline but not grossly enlarged. Recommend clinical correlation. 2. No pulmonary emboli identified. 3. 3 mm ground-glass nodule in the left lung. No follow-up recommended. This recommendation follows the consensus statement: Guidelines for Management of Incidental Pulmonary Nodules Detected on CT Images: From the Fleischner Society 2017; Radiology 2017; 284:228-243. 4. Mildly prominent porta hepatis nodes measuring up to 11 mm. These are nonspecific and incompletely evaluated. Electronically Signed   By: Gerome Sam III M.D   On: 03/18/2016 15:53   Dg Chest Port 1 View  Result Date: 03/19/2016 CLINICAL DATA:  Pleural effusion EXAM: PORTABLE CHEST 1 VIEW COMPARISON:  03/16/2016 FINDINGS: Small bilateral pleural effusions. Bibasilar opacities, likely atelectasis. Heart is upper limits normal in size. No acute bony abnormality. IMPRESSION: Small bilateral pleural effusions with bibasilar atelectasis. Electronically Signed   By: Charlett Nose M.D.   On: 03/19/2016 07:23   US Thoracentesis Asp Pleural  Space W/img Guide  Result Date: 03/19/2016 INDICATION: 80 year old female with shortness of breath and heart disease. Bilateral pleural effusions. Request for a thoracentesis. EXAM: ULTRASOUND GUIDED LEFT THORACENTESIS MEDICATIONS: None. COMPLICATIONS: None immediate. PROCEDURE: An ultrasound guided thoracentesis was thoroughly discussed with the patient and questions answered. The benefits, risks, alternatives and complications were also discussed. The patient understands and  wishes to proceed with the procedure. Written consent was obtained. Ultrasound was performed to localize and mark an adequate pocket of fluid in the left chest. Patient has allergies to local anesthetics. Therefore, the marked area was numbed using an ice pack prior to the puncture. The area was then prepped and draped in the normal sterile fashion. Under ultrasound guidance a 6 Fr Safe-T-Centesis catheter was introduced. Thoracentesis was performed. The catheter was removed and a dressing applied. FINDINGS: A total of approximately 600 mL of yellow fluid was removed. Samples were sent to the laboratory as requested by the clinical team. IMPRESSION: Successful ultrasound guided left thoracentesis yielding 600 mL of pleural fluid. Electronically Signed   By: Richarda OverlieAdam  Henn M.D.   On: 03/19/2016 10:39   ASSESSMENT AND PLAN:  80 y.o.femalewith a known history of HTN, HLD DM, OA presents to the emergency department complaining of cough and shortness of breath. Patient was in a usual state of health until About 2 weeks ago when she developed a chest cold as she describes it with chest congestion nonproductive cough  1. Acute hypoxic respiratory failure due to Pleural effusion and pulmonary edema,new onset and asthma exacerbation-Acute systolic CHF with severe Idiopathic cardiomyopathy - EF of 10% -s/p left side US guided thoracentesis with 600 cc removed, will order serum LDH -CT chest negative for PE, has bilateral effusions and pulm edema - Pulmonary signed off - .  Switch Lasix to 20 mg once daily.  Continue po spironolactone, ace, coreg per dr Welton Flakeskhan --Echo showed EF 10% with severe MR and severe Pulm HTN - may benefit from life vest as an outpatient -Cath on 03/19/16 showed insignificant CAD and severe pulm HTN with EF 10% - followed by dr Welton Flakeskhan  2.Acute sob/chest pain due to underlying severe idiopathic cardiomyopathy -wbc normal, no fever - stopped abxs and solumederol -cont inhalers and prn  nebs  3. Tachycardia due to 1 and 2  4.HTN -coreg and lisinopril  5.  Hypokalemia - Replete and recheck - Check magnesium  6. DM_2 cont meds and SSI  DVT prophylaxis on lovenox   Tried calling son mike on the phone but no luck thus far.   Case discussed with Care Management/Social Worker. - Patient will benefit from palliative care evaluation at home Management plans discussed with the patient, nursing at bedside and they are in agreement.  CODE STATUS: DO NOT RESUSCITATE  DVT Prophylaxis: lovenox  TOTAL critical TIME TAKING CARE OF THIS PATIENT: 30 minutes.  >50% time spent on counselling and coordination of care  POSSIBLE D/C IN 1-2 DAYS, DEPENDING ON CLINICAL CONDITION.  Note: This dictation was prepared with Dragon dictation along with smaller phrase technology. Any transcriptional errors that result from this process are unintentional.  Delfino LovettVipul Leyton Brownlee M.D on 03/20/2016 at 8:23 AM  Between 7am to 6pm - Pager - [phone]  After 6pm go to [email or web address] - password [email or web address] [facility]  [facility]  Office  [phone]  CC: Primary care physician; Roxy MannsMarne Tower, MD

## 2016-03-20 NOTE — Telephone Encounter (Signed)
[name] with [facility] left v/m; pt is being discharged from hospital on 03/21/16 with recommendation of in home palliative consult for symptom management and goals of care. [facility] has faxed over the request and request order faxed back to [phone].

## 2016-03-21 LAB — GLUCOSE, CAPILLARY
GLUCOSE-CAPILLARY: 358 mg/dL — AB (ref 65–99)
Glucose-Capillary: 175 mg/dL — ABNORMAL HIGH (ref 65–99)
Glucose-Capillary: 169 mg/dL — ABNORMAL HIGH (ref 65–99)

## 2016-03-21 LAB — PATHOLOGIST SMEAR REVIEW

## 2016-03-21 LAB — BASIC METABOLIC PANEL
Anion gap: 9 (ref 5–15)
BUN: 29 mg/dL — AB (ref 6–20)
CHLORIDE: 103 mmol/L (ref 101–111)
CO2: 27 mmol/L (ref 22–32)
CREATININE: 1.5 mg/dL — AB (ref 0.44–1.00)
Calcium: 8.9 mg/dL (ref 8.9–10.3)
GFR calc Af Amer: 37 mL/min — ABNORMAL LOW (ref >60)
GFR calc non Af Amer: 32 mL/min — ABNORMAL LOW (ref >60)
GLUCOSE: 159 mg/dL — AB (ref 65–99)
POTASSIUM: 3.6 mmol/L (ref 3.5–5.1)
Sodium: 139 mmol/L (ref 135–145)

## 2016-03-21 LAB — CBC
HCT: 29.4 % — ABNORMAL LOW (ref 35.0–47.0)
HEMOGLOBIN: 10.2 g/dL — AB (ref 12.0–16.0)
MCH: 30.6 pg (ref 26.0–34.0)
MCHC: 34.6 g/dL (ref 32.0–36.0)
MCV: 88.3 fL (ref 80.0–100.0)
Platelets: 227 10*3/uL (ref 150–440)
RBC: 3.33 MIL/uL — AB (ref 3.80–5.20)
RDW: 14.3 % (ref 11.5–14.5)
WBC: 9.4 10*3/uL (ref 3.6–11.0)

## 2016-03-21 LAB — CULTURE, BLOOD (ROUTINE X 2)
CULTURE: NO GROWTH
Culture: NO GROWTH

## 2016-03-21 LAB — MAGNESIUM: Magnesium: 2 mg/dL (ref 1.7–2.4)

## 2016-03-21 LAB — TSH: TSH: 0.513 u[IU]/mL (ref 0.350–4.500)

## 2016-03-21 MED ORDER — ZOLPIDEM TARTRATE 5 MG PO TABS: 5.0000 mg | ORAL_TABLET | Freq: Every evening | ORAL | 0 refills | Status: DC | PRN

## 2016-03-21 MED ORDER — ISOSORBIDE MONONITRATE ER 30 MG PO TB24: 30.0000 mg | ORAL_TABLET | Freq: Every day | ORAL | 0 refills | Status: DC

## 2016-03-21 MED ORDER — CARVEDILOL 12.5 MG PO TABS: 12.5000 mg | ORAL_TABLET | Freq: Two times a day (BID) | ORAL | 0 refills | Status: DC

## 2016-03-21 MED ORDER — DIGOXIN 250 MCG PO TABS: 0.2500 mg | ORAL_TABLET | Freq: Every day | ORAL | 0 refills | Status: DC

## 2016-03-21 MED ORDER — NITROGLYCERIN 0.4 MG SL SUBL: 0.4000 mg | SUBLINGUAL_TABLET | SUBLINGUAL | 0 refills | Status: DC | PRN

## 2016-03-21 MED ORDER — INSULIN ASPART 100 UNIT/ML ~~LOC~~ SOLN: 3.0000 [IU] | Freq: Three times a day (TID) | SUBCUTANEOUS | Status: DC

## 2016-03-21 MED ORDER — CARVEDILOL 25 MG PO TABS: 25.0000 mg | ORAL_TABLET | Freq: Two times a day (BID) | ORAL | Status: DC

## 2016-03-21 MED ORDER — GLIPIZIDE ER 5 MG PO TB24: 10.0000 mg | ORAL_TABLET | Freq: Every day | ORAL | 0 refills | Status: DC

## 2016-03-21 MED ORDER — DIGOXIN 62.5 MCG PO TABS: 0.0625 mg | ORAL_TABLET | Freq: Every day | ORAL | 0 refills | Status: DC

## 2016-03-21 MED ORDER — SACUBITRIL-VALSARTAN 24-26 MG PO TABS: 1.0000 | ORAL_TABLET | Freq: Two times a day (BID) | ORAL | 0 refills | Status: DC

## 2016-03-21 NOTE — Progress Notes (Signed)
Will check TSH, increase coreg and stop metoprolol.

## 2016-03-21 NOTE — Progress Notes (Signed)
Not[facility] Ermalene SearingNann Greene that patient no longer wishes to receive Palliative Services at home following discharge. Referral cancelled. Referral intake notified. Victorino DikeKaren Robertosn RN, BSN, [facility] [place], [facility] L[facility]ison [phone] c

## 2016-03-21 NOTE — Care Management (Signed)
[name]  Call reference [phone].  Sheila Burgerntresto does not require prior authorization and it is a Tier 3 drug.  Patient's copay per month is 45 dollars. Patient has a 30 day coupon and says is able to manage the copay.  Patient in the presence of her daughter informed CM that she  declined the Zoll Vest when delivered last night.  She is very firm that she does not want it. Says she informed cardiology this morning during rounds.  Patient says that her heart has never made her pass out.  Discussed with patient and her daughter that the life Vest is a life saving treatment. She says that she knows she will not wear it so there is no need to keep it.  Discussed that if she changes her mind, to notify her cardiologist.  [facility] [address] [facility] [place]. She has again declined she does not want to have a home health nurse and not interested in palliative care services in the home

## 2016-03-21 NOTE — Telephone Encounter (Signed)
Called and let Gavin PoundDeborah know Dr. Milinda Antisower will fill out the form tomorrow and she advised me that pt is refusing a palliative care consult right now so she doesn't need you to fill out the form but if pt changes her mind and agrees with the consultation they will let us know

## 2016-03-21 NOTE — Telephone Encounter (Signed)
I am out of the office and will do it tomorrow when I come back tomorrow-please remind me

## 2016-03-21 NOTE — Progress Notes (Signed)
Patient slept well with no incident. Very complaint with care. Will continue to monitor.

## 2016-03-21 NOTE — Progress Notes (Signed)
SUBJECTIVE: Patient is feeling well this morning with no further chest pain. Currently no shortness of breath.   Vitals:   03/20/16 1931 03/20/16 1934 03/21/16 0444 03/21/16 0818  BP: (!) 105/42  (!) 95/41 (!) 129/53  Pulse: 92 91 76 89  Resp: 16  14   Temp: 98.8 F (37.1 C)  98.2 F (36.8 C)   TempSrc: Oral  Oral   SpO2: 94% 95% 95% 95%  Weight:      Height:        Intake/Output Summary (Last 24 hours) at 03/21/16 0847 Last data filed at 03/20/16 1929  Gross per 24 hour  Intake              480 ml  Output                0 ml  Net              480 ml    LABS: Basic Metabolic Panel:  Recent Labs  09/81/1911/29/17 0540 03/20/16 0829 03/21/16 0422  NA 136  --  139  K 3.1*  --  3.6  CL 99*  --  103  CO2 25  --  27  GLUCOSE 159*  --  159*  BUN 19  --  29*  CREATININE 1.36*  --  1.50*  CALCIUM 9.1  --  8.9  MG  --  1.4* 2.0   Liver Function Tests: No results for input(s): AST, ALT, ALKPHOS, BILITOT, PROT, ALBUMIN in the last 72 hours. No results for input(s): LIPASE, AMYLASE in the last 72 hours. CBC:  Recent Labs  03/21/16 0422  WBC 9.4  HGB 10.2*  HCT 29.4*  MCV 88.3  PLT 227   Cardiac Enzymes: No results for input(s): CKTOTAL, CKMB, CKMBINDEX, TROPONINI in the last 72 hours. BNP: Invalid input(s): POCBNP D-Dimer: No results for input(s): DDIMER in the last 72 hours. Hemoglobin A1C: No results for input(s): HGBA1C in the last 72 hours. Fasting Lipid Panel: No results for input(s): CHOL, HDL, LDLCALC, TRIG, CHOLHDL, LDLDIRECT in the last 72 hours. Thyroid Function Tests: No results for input(s): TSH, T4TOTAL, T3FREE, THYROIDAB in the last 72 hours.  Invalid input(s): FREET3 Anemia Panel: No results for input(s): VITAMINB12, FOLATE, FERRITIN, TIBC, IRON, RETICCTPCT in the last 72 hours.   PHYSICAL EXAM General: Well developed, well nourished, in no acute distress HEENT:  Normocephalic and atramatic Neck:  No JVD.  Lungs: Clear bilaterally to  auscultation and percussion. Heart: HRRR . Normal S1 and S2 without gallops or murmurs.  Abdomen: Bowel sounds are positive, abdomen soft and non-tender  Msk:  Back normal, normal gait. Normal strength and tone for age. Extremities: No clubbing, cyanosis or edema.   Neuro: Alert and oriented X 3. Psych:  Good affect, responds appropriately  TELEMETRY: Sinus rhythm with rates in the 80s  ASSESSMENT AND PLAN: Newly diagnosed dilated heart failure with systolic ejection fraction of 14%10% and severe pulmonary hypertension with mitral valve disease. Only mild coronary artery disease per cardiac catheterization. Will treat patient with aggressive medical therapy including beta blocker, Aldactone, digoxin, Lasix and Entresto. Zoll this referral has been initiated and discussed with the patient and her family. They understand the LifeVest is to prevent sudden cardiac death related to decreased heart function and this does not lock or into an ICD in the future. This will be discussed at follow-up. The family has plans to take the patient home with them to provide a better environment for her recovery.  The patient is stable for discharge with follow-up in the office on Tuesday at 10:00.  Active Problems:   Community acquired pneumonia    Berton BonJanine Rakia Frayne, NP 03/21/2016 8:47 AM

## 2016-03-21 NOTE — Telephone Encounter (Signed)
Aware, thanks!

## 2016-03-21 NOTE — Progress Notes (Signed)
Nutrition Follow-up  DOCUMENTATION CODES:   Not applicable  INTERVENTION:  1. Continue Snacks  NUTRITION DIAGNOSIS:   Inadequate oral intake related to poor appetite as evidenced by per patient/family report. -resolving  GOAL:   Patient will meet greater than or equal to 90% of their needs -meeting  MONITOR:   PO intake, I & O's, Labs, Weight trends  REASON FOR ASSESSMENT:   Malnutrition Screening Tool    ASSESSMENT:   [name] is a 80 y.o. female with a known history of HTN, HLD DM, OA  presents to the emergency department complaining of cough and shortness of breath.  She is doing well. Refusing Palliative at this time. PO is good, - 100% at last 4 meals. Had 2 pancakes and toast this morning. Grilled chicken with a baked potato and ice tea for dinner yesterday.  Seems to be consuming adequate protein. Labs and medications reviewed: CBGs 122-298 Ca-Vit D,  Lasix, MVI w/ minerals, Omega-3's  Diet Order:  Diet Carb Modified Fluid consistency: Thin; Room service appropriate? Yes  Skin:  Reviewed, no issues  Last BM:  03/17/2016  Height:   Ht Readings from Last 1 Encounters:  03/16/16 4\' 11"  (1.499 m)    Weight:   Wt Readings from Last 1 Encounters:  03/19/16 134 lb 12.8 oz (61.1 kg)    Ideal Body Weight:  43.18 kg  BMI:  Body mass index is 27.23 kg/m.  Estimated Nutritional Needs:   Kcal:  1270-1500 calories  Protein:  63-76 gm  Fluid:  >/= 1.3L  EDUCATION NEEDS:   No education needs identified at this time  Dionne AnoWilliam M. Kathy Wares, MS, RD LDN Inpatient Clinical Dietitian Pager [phone]

## 2016-03-21 NOTE — Discharge Instructions (Signed)
Heart Failure Clinic appointment on April 05, 2016 at 10:00am with Clarisa Kindredina Hackney, FNP. Please call [phone] to reschedule.     Keep right wrist elevated on pillow above the heart for today.  Watch right wrist for evidence of bleeding or hematoma.. If bleeding or hematoma noted, hold pressure over the site for at least 15 minutes and notify the physician.  No blending or flexing of the wrist--no lifting for the remainder of the day or for 2 weeks after your procedure. Notify the physician for evidence of infection or if you get a temperature. Keep right wrist elevated on pillow above the heart for today.  Watch right wrist for evidence of bleeding or hematoma.. If bleeding or hematoma noted, hold pressure over the site for at least 15 minutes and notify the physician.  No blending or flexing of the wrist--no lifting for the remainder of the day or for 2 weeks after your procedure. Notify the physician for evidence of infection or if you get a temperature. Groin Insertion Instructions-If you lose feeling or develop tingling or pain in your leg or foot after the procedure, please walk around first.  If the discomfort does not improve , contact your physician and proceed to the nearest emergency room.  Loss of feeling in your leg might mean that a blockage has formed in the artery and this can be appropriately treated.  Limit your activity for the next two days after your procedure.  Avoid stooping, bending, heavy lifting or exertion as this may put pressure on the insertion site.  Resume normal activities in 48 hours.  You may shower after 24 hours but avoid excessive warm water and do not scrub the site.  Remove clear dressing in 48 hours.  If you have had a closure device inserted, do not soak in a tub bath or a hot tub for at least one week.  No driving for 48 hours after discharge.  After the procedure, check the insertion site occasionally.  If any oozing occurs or there is apparent swelling, firm  pressure over the site will prevent a bruise from forming.  You can not hurt anything by pressing directly on the site.  The pressure stops the bleeding by allowing a small clot to form.  If the bleeding continues after the pressure has been applied for more than 15 minutes, call 911 or go to the nearest emergency room.    The x-ray dye causes you to pass a considerate amount of urine.  For this reason, you will be asked to drink plenty of liquids after the procedure to prevent dehydration.  You may resume you regular diet.  Avoid caffeine products.    For pain at the site of your procedure, take non-aspirin medicines such as Tylenol.  Medications: A. Hold Metformin for 48 hours if applicable.  B. Continue taking all your present medications at home unless your doctor prescribes any changes.    Heart Failure Heart failure means your heart has trouble pumping blood. This makes it hard for your body to work well. Heart failure is usually a long-term (chronic) condition. You must take good care of yourself and follow your doctor's treatment plan. HOME CARE  Take your heart medicine as told by your doctor.  Do not stop taking medicine unless your doctor tells you to.  Do not skip any dose of medicine.  Refill your medicines before they run out.  Take other medicines only as told by your doctor or pharmacist.  Stay active  if told by your doctor. The elderly and people with severe heart failure should talk with a doctor about physical activity.  Eat heart-healthy foods. Choose foods that are without trans fat and are low in saturated fat, cholesterol, and salt (sodium). This includes fresh or frozen fruits and vegetables, fish, lean meats, fat-free or low-fat dairy foods, whole grains, and high-fiber foods. Lentils and dried peas and beans (legumes) are also good choices.  Limit salt if told by your doctor.  Cook in a healthy way. Roast, grill, broil, bake, poach, steam, or stir-fry  foods.  Limit fluids as told by your doctor.  Weigh yourself every morning. Do this after you pee (urinate) and before you eat breakfast. Write down your weight to give to your doctor.  Take your blood pressure and write it down if your doctor tells you to.  Ask your doctor how to check your pulse. Check your pulse as told.  Lose weight if told by your doctor.  Stop smoking or chewing tobacco. Do not use gum or patches that help you quit without your doctor's approval.  Schedule and go to doctor visits as told.  Nonpregnant women should have no more than 1 drink a day. Men should have no more than 2 drinks a day. Talk to your doctor about drinking alcohol.  Stop illegal drug use.  Stay current with shots (immunizations).  Manage your health conditions as told by your doctor.  Learn to manage your stress.  Rest when you are tired.  If it is really hot outside:  Avoid intense activities.  Use air conditioning or fans, or get in a cooler place.  Avoid caffeine and alcohol.  Wear loose-fitting, lightweight, and light-colored clothing.  If it is really cold outside:  Avoid intense activities.  Layer your clothing.  Wear mittens or gloves, a hat, and a scarf when going outside.  Avoid alcohol.  Learn about heart failure and get support as needed.  Get help to maintain or improve your quality of life and your ability to care for yourself as needed. GET HELP IF:   You gain weight quickly.  You are more short of breath than usual.  You cannot do your normal activities.  You tire easily.  You cough more than normal, especially with activity.  You have any or more puffiness (swelling) in areas such as your hands, feet, ankles, or belly (abdomen).  You cannot sleep because it is hard to breathe.  You feel like your heart is beating fast (palpitations).  You get dizzy or light-headed when you stand up. GET HELP RIGHT AWAY IF:   You have trouble  breathing.  There is a change in mental status, such as becoming less alert or not being able to focus.  You have chest pain or discomfort.  You faint. MAKE SURE YOU:   Understand these instructions.  Will watch your condition.  Will get help right away if you are not doing well or get worse. This information is not intended to replace advice given to you by your health care provider. Make sure you discuss any questions you have with your health care provider. Document Released: 01/16/2008 Document Revised: 04/29/2014 Document Reviewed: 05/25/2012 Elsevier Interactive Patient Education  2017 [employer]

## 2016-03-21 NOTE — Progress Notes (Signed)
Inpatient Diabetes Program Recommendations  AACE/ADA: New Consensus Statement on Inpatient Glycemic Control (2015)  Target Ranges:  Prepandial:   less than 140 mg/dL      Peak postprandial:   less than 180 mg/dL (1-2 hours)      Critically ill patients:  140 - 180 mg/dL   Results for [name], [name] (MRN [id number]) as of 03/21/2016 10:01  Ref. Range 03/20/2016 07:50 03/20/2016 12:07 03/20/2016 16:48 03/20/2016 21:38 03/21/2016 07:42  Glucose-Capillary Latest Ref Range: 65 - 99 mg/dL 409170 (H) 811298 (H) 914122 (H) 265 (H) 175 (H)   Review of Glycemic Control  Current orders for Inpatient glycemic control: Novolog 0-15 units TID with meals, Novolog 0-5 units QHS  Inpatient Diabetes Program Recommendations: Insulin - Meal Coverage: If patient eats at least 50% of meals, please consider ordering Novolog 3 units TID with meals for meal coverage.  Thanks, Orlando PennerMarie Marqueze Ramcharan, RN, MSN, CDE Diabetes Coordinator Inpatient Diabetes Program [phone] (Team Pager from 8am to 5pm)

## 2016-03-22 NOTE — Discharge Summary (Signed)
[facility] NAME: [name]    MR#:  [id number]  DATE OF BIRTH:  18-May-1935  DATE OF ADMISSION:  03/16/2016   ADMITTING PHYSICIAN: Harvie Bridge, DO  DATE OF DISCHARGE: 03/21/2016  6:19 PM  PRIMARY CARE PHYSICIAN: Loura Pardon, MD   ADMISSION DIAGNOSIS:  Community acquired pneumonia, unspecified laterality [J18.9] DISCHARGE DIAGNOSIS:  Active Problems:   Community acquired pneumonia  SECONDARY DIAGNOSIS:   Past Medical History:  Diagnosis Date  . Asthma    childhood  . Depression   . Diabetes mellitus    Type II  . Foot fracture, right 3/13  . H/O scoliosis   . HLD (hyperlipidemia)   . HTN (hypertension)   . Osteoarthritis    hands,elbow,knees  . Osteoporosis   . Rhinitis, allergic   . Scoliosis    chronic back pain   HOSPITAL COURSE:  80 y.o.femalewith a known history of HTN, HLD DM, OA admitted for cough and shortness of breath. Patient was in a usual state of health until About 2 weeks ago when she developed a chest cold as she describes it with chest congestionnonproductive cough  1. Acute hypoxic respiratory failure due to Pleural effusion and pulmonary edema,new onset and asthma exacerbation-Acute systolic CHF with severe Idiopathic cardiomyopathy - EF of 10% -s/p left side US guided thoracentesis with 600 cc removed,  -CT chest negative for PE, has bilateral effusions and pulm edema --Echo showed EF 10% with severe MR and severe Pulm HTN - may benefit from life vest as an outpatient -Cath on 03/19/16 showed insignificant CAD and severe pulm HTN with EF 10%  2. Acute systolic CHF with severe Idiopathic cardiomyopathy - EF of 10% Echo showed EF 10% with severe MR and severe Pulm HTN - may benefit from life vest as an outpatient -Cath on 03/19/16 showed insignificant CAD and severe pulm HTN with EF 10%  3. Tachycardia due to 1 and 2  4.HTN -coreg and lisinopril  5.  Hypokalemia -  Repleted  6. ARF Could be multifactorial - cath dye, ace-I, metformin NSAIDs. All have been stopped. She has been requested to take lots of liquids and f/up BMP with Dr Humphrey Rolls next Tuesday DISCHARGE CONDITIONS:  stable CONSULTS OBTAINED:   DRUG ALLERGIES:   Allergies  Allergen Reactions  . Acetaminophen     REACTION: nausea  . Codeine     REACTION: itching  . Fluticasone Propionate     REACTION: palpitations  . Fosamax [Alendronate Sodium]     Difficulty swallowing and fever/ joint pain   . Lidocaine Nausea And Vomiting    Increases heart rate, N/V per pt  . Naproxen     REACTION: GI  . Nsaids     REACTION: elevated LFT's  . Penicillins     REACTION: swelling at inj site   DISCHARGE MEDICATIONS:     Medication List    STOP taking these medications   ADVIL PM PO   metFORMIN 500 MG tablet Commonly known as:  GLUCOPHAGE   metoprolol tartrate 25 MG tablet Commonly known as:  LOPRESSOR   quinapril 40 MG tablet Commonly known as:  ACCUPRIL     TAKE these medications   albuterol 108 (90 Base) MCG/ACT inhaler Commonly known as:  PROVENTIL HFA;VENTOLIN HFA Inhale 2 puffs into the lungs every 4 (four) hours as needed for wheezing.   amitriptyline 25 MG tablet Commonly known as:  ELAVIL Take 1 tablet (25 mg total) by mouth  at bedtime.   aspirin 81 MG tablet Take 81 mg by mouth daily.   atorvastatin 40 MG tablet Commonly known as:  LIPITOR TAKE 1 TABLET BY MOUTH DAILY   CALCIUM 600+D 600-400 MG-UNIT tablet Generic drug:  Calcium Carbonate-Vitamin D Take 2 tablets by mouth daily.   carvedilol 12.5 MG tablet Commonly known as:  COREG Take 1 tablet (12.5 mg total) by mouth 2 (two) times daily with a meal.   cyanocobalamin 1000 MCG/ML injection Commonly known as:  (VITAMIN B-12) Inject 1 mL (1,000 mcg total) into the muscle every 6 (six) weeks.   Digoxin 62.5 MCG Tabs Take 0.0625 mg by mouth daily.   fish oil-omega-3 fatty acids 1000 MG capsule Take 1  capsule by mouth 2 (two) times daily.   glipiZIDE 5 MG 24 hr tablet Commonly known as:  GLUCOTROL XL Take 2 tablets (10 mg total) by mouth daily. What changed:  how much to take   isosorbide mononitrate 30 MG 24 hr tablet Commonly known as:  IMDUR Take 1 tablet (30 mg total) by mouth daily.   meclizine 25 MG tablet Commonly known as:  ANTIVERT Take 25 mg by mouth as needed.   multivitamin tablet Take 1 tablet by mouth daily.   nitroGLYCERIN 0.4 MG SL tablet Commonly known as:  NITROSTAT Place 1 tablet (0.4 mg total) under the tongue every 5 (five) minutes as needed for chest pain.   omeprazole 20 MG capsule Commonly known as:  PRILOSEC Take 20 mg by mouth daily.   ONE TOUCH LANCETS Misc Check blood sugar once daily and as directed. Dx E11.9   ONETOUCH VERIO FLEX SYSTEM w/Device Kit 1 Device by Other route daily. Check blood sugar once daily and as directed. Dx E11.9   ONETOUCH VERIO test strip Generic drug:  glucose blood CHECK BLOOD SUGAR ONCE DAILY AND AS DIRECTED. DX E11.9   sacubitril-valsartan 24-26 MG Commonly known as:  ENTRESTO Take 1 tablet by mouth 2 (two) times daily.   zolpidem 5 MG tablet Commonly known as:  AMBIEN Take 1 tablet (5 mg total) by mouth at bedtime as needed for sleep.        DISCHARGE INSTRUCTIONS:   f/up BMP with Dr Humphrey Rolls next Tuesday 12/5 with results to PCP for monitoring of renal function DIET:  Regular diet DISCHARGE CONDITION:  Good ACTIVITY:  Activity as tolerated OXYGEN:  Home Oxygen: No.  Oxygen Delivery: room air DISCHARGE LOCATION:  home   If you experience worsening of your admission symptoms, develop shortness of breath, life threatening emergency, suicidal or homicidal thoughts you must seek medical attention immediately by calling 911 or calling your MD immediately  if symptoms less severe.  You Must read complete instructions/literature along with all the possible adverse reactions/side effects for all the  Medicines you take and that have been prescribed to you. Take any new Medicines after you have completely understood and accpet all the possible adverse reactions/side effects.   Please note  You were cared for by a hospitalist during your hospital stay. If you have any questions about your discharge medications or the care you received while you were in the hospital after you are discharged, you can call the unit and asked to speak with the hospitalist on call if the hospitalist that took care of you is not available. Once you are discharged, your primary care physician will handle any further medical issues. Please note that NO REFILLS for any discharge medications will be authorized once you are discharged,  as it is imperative that you return to your primary care physician (or establish a relationship with a primary care physician if you do not have one) for your aftercare needs so that they can reassess your need for medications and monitor your lab values.    On the day of Discharge:  VITAL SIGNS:  Blood pressure (!) 104/53, pulse 67, temperature 98.2 F (36.8 C), temperature source Oral, resp. rate 14, height '4\' 11"'$  (1.499 m), weight 61.1 kg (134 lb 12.8 oz), last menstrual period 04/22/1985, SpO2 96 %. PHYSICAL EXAMINATION:  GENERAL:  80 y.o.-year-old patient lying in the bed with no acute distress.  EYES: Pupils equal, round, reactive to light and accommodation. No scleral icterus. Extraocular muscles intact.  HEENT: Head atraumatic, normocephalic. Oropharynx and nasopharynx clear.  NECK:  Supple, no jugular venous distention. No thyroid enlargement, no tenderness.  LUNGS: Normal breath sounds bilaterally, no wheezing, rales,rhonchi or crepitation. No use of accessory muscles of respiration.  CARDIOVASCULAR: S1, S2 normal. No murmurs, rubs, or gallops.  ABDOMEN: Soft, non-tender, non-distended. Bowel sounds present. No organomegaly or mass.  EXTREMITIES: No pedal edema, cyanosis, or  clubbing.  NEUROLOGIC: Cranial nerves II through XII are intact. Muscle strength 5/5 in all extremities. Sensation intact. Gait not checked.  PSYCHIATRIC: The patient is alert and oriented x 3.  SKIN: No obvious rash, lesion, or ulcer.  DATA REVIEW:   CBC  Recent Labs Lab 03/21/16 0422  WBC 9.4  HGB 10.2*  HCT 29.4*  PLT 227    Chemistries   Recent Labs Lab 03/16/16 1842  03/21/16 0422  NA 136  < > 139  K 4.0  < > 3.6  CL 105  < > 103  CO2 22  < > 27  GLUCOSE 197*  < > 159*  BUN 11  < > 29*  CREATININE 1.35*  < > 1.50*  CALCIUM 9.6  < > 8.9  MG  --   < > 2.0  AST 39  --   --   ALT 26  --   --   ALKPHOS 49  --   --   BILITOT 0.5  --   --   < > = values in this interval not displayed.  Follow-up Information    Dionisio David, MD. Go on 03/26/2016.   Specialty:  Cardiology Why:  Time: 10:00 a.m. Contact information: [place] [address] [phone]        Alisa Graff, [place]. Go on 04/05/2016.   Specialty:  Family Medicine Why:  at 10:00am to the Heart Failure Clinic Contact information: [address] [place] [address] [phone]        Perrin Maltese, MD. Go on 04/19/2016.   Specialty:  Internal Medicine Why:  Time:9:15 a.m.Please bring insurance card, picture ID, and current medications. Contact information: [place] [address] [phone]           Management plans discussed with the patient, family and they are in agreement.  CODE STATUS: DNR  TOTAL TIME TAKING CARE OF THIS PATIENT: 45 minutes.    Max Sane M.D on 03/22/2016 at 7:41 PM  Between 7am to 6pm - Pager - [phone]  After 6pm go to [email or web address] - Proofreader  [facility]  Office  [phone]  CC: Primary care physician; Loura Pardon, MD   Note: This dictation was prepared with Dragon dictation along with smaller phrase technology. Any transcriptional errors that  result from  this process are unintentional.

## 2016-03-26 ENCOUNTER — Other Ambulatory Visit: Payer: Self-pay | Admitting: Family Medicine

## 2016-03-26 DIAGNOSIS — K219 Gastro-esophageal reflux disease without esophagitis: Secondary | ICD-10-CM | POA: Diagnosis not present

## 2016-03-26 DIAGNOSIS — R0600 Dyspnea, unspecified: Secondary | ICD-10-CM | POA: Diagnosis not present

## 2016-03-26 DIAGNOSIS — I420 Dilated cardiomyopathy: Secondary | ICD-10-CM | POA: Diagnosis not present

## 2016-03-26 DIAGNOSIS — E119 Type 2 diabetes mellitus without complications: Secondary | ICD-10-CM | POA: Diagnosis not present

## 2016-03-26 DIAGNOSIS — R0602 Shortness of breath: Secondary | ICD-10-CM | POA: Diagnosis not present

## 2016-03-26 DIAGNOSIS — E785 Hyperlipidemia, unspecified: Secondary | ICD-10-CM | POA: Diagnosis not present

## 2016-03-26 DIAGNOSIS — I509 Heart failure, unspecified: Secondary | ICD-10-CM | POA: Diagnosis not present

## 2016-03-26 NOTE — Telephone Encounter (Signed)
It was d/c 'd -please take it off med list It was replaced with Sherryll BurgerEntresto  Thanks

## 2016-03-26 NOTE — Telephone Encounter (Signed)
Med declined.

## 2016-03-26 NOTE — Telephone Encounter (Signed)
?   If med was d/c while in the hospital, please advise if pt should still be on it

## 2016-04-01 ENCOUNTER — Encounter: Payer: Self-pay | Admitting: Family Medicine

## 2016-04-01 DIAGNOSIS — I5022 Chronic systolic (congestive) heart failure: Secondary | ICD-10-CM | POA: Insufficient documentation

## 2016-04-02 DIAGNOSIS — I10 Essential (primary) hypertension: Secondary | ICD-10-CM | POA: Diagnosis not present

## 2016-04-02 DIAGNOSIS — R5383 Other fatigue: Secondary | ICD-10-CM | POA: Diagnosis not present

## 2016-04-02 DIAGNOSIS — E119 Type 2 diabetes mellitus without complications: Secondary | ICD-10-CM | POA: Diagnosis not present

## 2016-04-02 DIAGNOSIS — E785 Hyperlipidemia, unspecified: Secondary | ICD-10-CM | POA: Diagnosis not present

## 2016-04-02 DIAGNOSIS — E559 Vitamin D deficiency, unspecified: Secondary | ICD-10-CM | POA: Diagnosis not present

## 2016-04-02 DIAGNOSIS — D509 Iron deficiency anemia, unspecified: Secondary | ICD-10-CM | POA: Diagnosis not present

## 2016-04-02 DIAGNOSIS — E539 Vitamin B deficiency, unspecified: Secondary | ICD-10-CM | POA: Diagnosis not present

## 2016-04-03 ENCOUNTER — Other Ambulatory Visit: Payer: Self-pay | Admitting: Nurse Practitioner

## 2016-04-03 DIAGNOSIS — Z1231 Encounter for screening mammogram for malignant neoplasm of breast: Secondary | ICD-10-CM

## 2016-04-05 ENCOUNTER — Telehealth: Payer: Self-pay | Admitting: Family

## 2016-04-05 ENCOUNTER — Ambulatory Visit: Payer: Medicare Other | Admitting: Family

## 2016-04-05 NOTE — Telephone Encounter (Signed)
Patient missed her initial appointment at the [facility] Failure Clinic on 04/05/16. Will attempt to reschedule.

## 2016-04-09 ENCOUNTER — Ambulatory Visit (INDEPENDENT_AMBULATORY_CARE_PROVIDER_SITE_OTHER): Payer: Medicare Other | Admitting: Podiatry

## 2016-04-09 ENCOUNTER — Other Ambulatory Visit: Payer: Self-pay | Admitting: *Deleted

## 2016-04-09 ENCOUNTER — Ambulatory Visit (INDEPENDENT_AMBULATORY_CARE_PROVIDER_SITE_OTHER): Payer: Medicare Other

## 2016-04-09 VITALS — BP 127/67 | HR 79 | Temp 97.6°F | Resp 16

## 2016-04-09 DIAGNOSIS — E0843 Diabetes mellitus due to underlying condition with diabetic autonomic (poly)neuropathy: Secondary | ICD-10-CM

## 2016-04-09 DIAGNOSIS — S92515A Nondisplaced fracture of proximal phalanx of left lesser toe(s), initial encounter for closed fracture: Secondary | ICD-10-CM | POA: Diagnosis not present

## 2016-04-09 DIAGNOSIS — R52 Pain, unspecified: Secondary | ICD-10-CM

## 2016-04-09 DIAGNOSIS — M79672 Pain in left foot: Secondary | ICD-10-CM

## 2016-04-09 DIAGNOSIS — M79676 Pain in unspecified toe(s): Secondary | ICD-10-CM

## 2016-04-09 DIAGNOSIS — M79609 Pain in unspecified limb: Secondary | ICD-10-CM | POA: Diagnosis not present

## 2016-04-09 DIAGNOSIS — L603 Nail dystrophy: Secondary | ICD-10-CM | POA: Diagnosis not present

## 2016-04-09 DIAGNOSIS — B351 Tinea unguium: Secondary | ICD-10-CM | POA: Diagnosis not present

## 2016-04-09 DIAGNOSIS — L608 Other nail disorders: Secondary | ICD-10-CM

## 2016-04-09 NOTE — Progress Notes (Signed)
   Subjective:    Patient ID: Junius FinnerPatricia A Corning, female    DOB: Apr 30, 1935, 80 y.o.   MRN: [id number]  HPI    Review of Systems  Neurological: Positive for weakness.  Hematological: Bruises/bleeds easily.  All other systems reviewed and are negative.      Objective:   Physical Exam        Assessment & Plan:

## 2016-04-11 DIAGNOSIS — E119 Type 2 diabetes mellitus without complications: Secondary | ICD-10-CM | POA: Diagnosis not present

## 2016-04-11 DIAGNOSIS — D509 Iron deficiency anemia, unspecified: Secondary | ICD-10-CM | POA: Diagnosis not present

## 2016-04-11 DIAGNOSIS — J159 Unspecified bacterial pneumonia: Secondary | ICD-10-CM | POA: Diagnosis not present

## 2016-04-11 DIAGNOSIS — I10 Essential (primary) hypertension: Secondary | ICD-10-CM | POA: Diagnosis not present

## 2016-04-11 DIAGNOSIS — I420 Dilated cardiomyopathy: Secondary | ICD-10-CM | POA: Diagnosis not present

## 2016-04-11 DIAGNOSIS — I509 Heart failure, unspecified: Secondary | ICD-10-CM | POA: Diagnosis not present

## 2016-04-20 ENCOUNTER — Other Ambulatory Visit: Payer: Self-pay | Admitting: Family Medicine

## 2016-04-20 NOTE — Progress Notes (Signed)
Patient ID: [name], female   DOB: [birth year], 80 y.o.   MRN: [id number] SUBJECTIVE Patient with a history of diabetes mellitus presents to office today complaining of elongated, thickened nails. Pain while ambulating in shoes. Patient is unable to trim their own nails.  Patient also has a complaint of pain to her fourth digit left foot. Patient states that she fell into an And hit her fourth toe approximately one week ago. Patient immediately noticed ecchymosis with swelling. Patient presents today for further treatment and evaluation  Allergies  Allergen Reactions  . Acetaminophen     REACTION: nausea  . Codeine     REACTION: itching  . Fluticasone Propionate     REACTION: palpitations  . Fosamax [Alendronate Sodium]     Difficulty swallowing and fever/ joint pain   . Lidocaine Nausea And Vomiting    Increases heart rate, N/V per pt  . Naproxen     REACTION: GI  . Nsaids     REACTION: elevated LFT's  . Penicillins     REACTION: swelling at inj site    OBJECTIVE General Patient is awake, alert, and oriented x 3 and in no acute distress. Derm Skin is dry and supple bilateral. Negative open lesions or macerations. Remaining integument unremarkable. Nails are tender, long, thickened and dystrophic with subungual debris, consistent with onychomycosis, 1-5 bilateral. No signs of infection noted. Vasc  DP and PT pedal pulses palpable bilaterally. Temperature gradient within normal limits.  Neuro Epicritic and protective threshold sensation diminished bilaterally.  Musculoskeletal Exam Moderate pain on palpation noted to the fourth digit left foot. No symptomatic pedal deformities noted bilateral. Muscular strength within normal limits. Radiographic Exam radiographic exam is positive for nondisplaced, closed fracture of the proximal phalanx fourth digit left foot. No other significant findings on x-ray.  ASSESSMENT 1. Diabetes Mellitus w/ peripheral neuropathy 2. Onychomycosis of  nail due to dermatophyte bilateral 3. Pain in foot bilateral 4. Fracture fourth digit left foot-nondisplaced, closed, initial encounter  PLAN OF CARE 1. Patient evaluated today. 2. Instructed to maintain good pedal hygiene and foot care. Stressed importance of controlling blood sugar.  3. Mechanical debridement of nails 1-5 bilaterally performed using a nail nipper. Filed with dremel without incident.  4. Today we discussed the conservative management of the toe fracture. Conservative management consisting of anti-inflammatory medication, compressive dressings. 5. Return to clinic in 3 mos.     [name], DPM [facility]  Dr. Felecia ShellingBrent M. [name], DPM   [address]                                        [place], [place] [address]                Office [phone]  Fax [phone]) [phone]

## 2016-04-24 NOTE — Telephone Encounter (Signed)
Med not on med list and it shows med was d/c in hospital in Nov 2017, please advise

## 2016-04-24 NOTE — Telephone Encounter (Signed)
It does look like this was stopped-please verify with her  Thanks

## 2016-04-24 NOTE — Telephone Encounter (Signed)
Pt said Dr. Romeo Appleonn is her new PCP and she has xfered care, Rx declined

## 2016-04-26 DIAGNOSIS — E119 Type 2 diabetes mellitus without complications: Secondary | ICD-10-CM | POA: Diagnosis not present

## 2016-04-26 DIAGNOSIS — J159 Unspecified bacterial pneumonia: Secondary | ICD-10-CM | POA: Diagnosis not present

## 2016-04-26 DIAGNOSIS — E785 Hyperlipidemia, unspecified: Secondary | ICD-10-CM | POA: Diagnosis not present

## 2016-04-26 DIAGNOSIS — F5101 Primary insomnia: Secondary | ICD-10-CM | POA: Diagnosis not present

## 2016-04-26 DIAGNOSIS — D509 Iron deficiency anemia, unspecified: Secondary | ICD-10-CM | POA: Diagnosis not present

## 2016-04-29 DIAGNOSIS — J159 Unspecified bacterial pneumonia: Secondary | ICD-10-CM | POA: Diagnosis not present

## 2016-04-29 DIAGNOSIS — E785 Hyperlipidemia, unspecified: Secondary | ICD-10-CM | POA: Diagnosis not present

## 2016-04-29 DIAGNOSIS — D509 Iron deficiency anemia, unspecified: Secondary | ICD-10-CM | POA: Diagnosis not present

## 2016-04-29 DIAGNOSIS — E119 Type 2 diabetes mellitus without complications: Secondary | ICD-10-CM | POA: Diagnosis not present

## 2016-04-29 DIAGNOSIS — F5101 Primary insomnia: Secondary | ICD-10-CM | POA: Diagnosis not present

## 2016-05-06 DIAGNOSIS — I10 Essential (primary) hypertension: Secondary | ICD-10-CM | POA: Diagnosis not present

## 2016-05-06 DIAGNOSIS — F5101 Primary insomnia: Secondary | ICD-10-CM | POA: Diagnosis not present

## 2016-05-06 DIAGNOSIS — E119 Type 2 diabetes mellitus without complications: Secondary | ICD-10-CM | POA: Diagnosis not present

## 2016-05-06 DIAGNOSIS — E785 Hyperlipidemia, unspecified: Secondary | ICD-10-CM | POA: Diagnosis not present

## 2016-05-08 ENCOUNTER — Ambulatory Visit: Payer: Medicare Other

## 2016-05-13 DIAGNOSIS — I10 Essential (primary) hypertension: Secondary | ICD-10-CM | POA: Diagnosis not present

## 2016-05-13 DIAGNOSIS — I509 Heart failure, unspecified: Secondary | ICD-10-CM | POA: Diagnosis not present

## 2016-05-13 DIAGNOSIS — I2510 Atherosclerotic heart disease of native coronary artery without angina pectoris: Secondary | ICD-10-CM | POA: Diagnosis not present

## 2016-05-13 DIAGNOSIS — I420 Dilated cardiomyopathy: Secondary | ICD-10-CM | POA: Diagnosis not present

## 2016-05-20 DIAGNOSIS — E119 Type 2 diabetes mellitus without complications: Secondary | ICD-10-CM | POA: Diagnosis not present

## 2016-05-20 DIAGNOSIS — M5431 Sciatica, right side: Secondary | ICD-10-CM | POA: Diagnosis not present

## 2016-05-26 ENCOUNTER — Other Ambulatory Visit: Payer: Self-pay | Admitting: Family Medicine

## 2016-06-04 DIAGNOSIS — I10 Essential (primary) hypertension: Secondary | ICD-10-CM | POA: Diagnosis not present

## 2016-06-04 DIAGNOSIS — E119 Type 2 diabetes mellitus without complications: Secondary | ICD-10-CM | POA: Diagnosis not present

## 2016-06-04 DIAGNOSIS — M25571 Pain in right ankle and joints of right foot: Secondary | ICD-10-CM | POA: Diagnosis not present

## 2016-06-10 DIAGNOSIS — E1122 Type 2 diabetes mellitus with diabetic chronic kidney disease: Secondary | ICD-10-CM | POA: Diagnosis not present

## 2016-06-10 DIAGNOSIS — I129 Hypertensive chronic kidney disease with stage 1 through stage 4 chronic kidney disease, or unspecified chronic kidney disease: Secondary | ICD-10-CM | POA: Diagnosis not present

## 2016-06-10 DIAGNOSIS — N183 Chronic kidney disease, stage 3 (moderate): Secondary | ICD-10-CM | POA: Diagnosis not present

## 2016-06-10 DIAGNOSIS — N179 Acute kidney failure, unspecified: Secondary | ICD-10-CM | POA: Diagnosis not present

## 2016-06-11 DIAGNOSIS — I2510 Atherosclerotic heart disease of native coronary artery without angina pectoris: Secondary | ICD-10-CM | POA: Diagnosis not present

## 2016-06-11 DIAGNOSIS — I420 Dilated cardiomyopathy: Secondary | ICD-10-CM | POA: Diagnosis not present

## 2016-06-11 DIAGNOSIS — I509 Heart failure, unspecified: Secondary | ICD-10-CM | POA: Diagnosis not present

## 2016-06-11 DIAGNOSIS — M25571 Pain in right ankle and joints of right foot: Secondary | ICD-10-CM | POA: Diagnosis not present

## 2016-06-12 DIAGNOSIS — I129 Hypertensive chronic kidney disease with stage 1 through stage 4 chronic kidney disease, or unspecified chronic kidney disease: Secondary | ICD-10-CM | POA: Diagnosis not present

## 2016-06-12 DIAGNOSIS — E1122 Type 2 diabetes mellitus with diabetic chronic kidney disease: Secondary | ICD-10-CM | POA: Diagnosis not present

## 2016-06-12 DIAGNOSIS — N183 Chronic kidney disease, stage 3 (moderate): Secondary | ICD-10-CM | POA: Diagnosis not present

## 2016-06-12 DIAGNOSIS — N179 Acute kidney failure, unspecified: Secondary | ICD-10-CM | POA: Diagnosis not present

## 2016-06-24 ENCOUNTER — Other Ambulatory Visit: Payer: Self-pay | Admitting: Family Medicine

## 2016-07-08 DIAGNOSIS — E785 Hyperlipidemia, unspecified: Secondary | ICD-10-CM | POA: Diagnosis not present

## 2016-07-08 DIAGNOSIS — I10 Essential (primary) hypertension: Secondary | ICD-10-CM | POA: Diagnosis not present

## 2016-07-10 DIAGNOSIS — F5101 Primary insomnia: Secondary | ICD-10-CM | POA: Diagnosis not present

## 2016-07-10 DIAGNOSIS — E119 Type 2 diabetes mellitus without complications: Secondary | ICD-10-CM | POA: Diagnosis not present

## 2016-07-10 DIAGNOSIS — M5441 Lumbago with sciatica, right side: Secondary | ICD-10-CM | POA: Diagnosis not present

## 2016-07-18 ENCOUNTER — Ambulatory Visit: Payer: Medicare Other | Admitting: Podiatry

## 2016-07-22 ENCOUNTER — Ambulatory Visit: Payer: Medicare Other | Admitting: Podiatry

## 2016-08-07 DIAGNOSIS — N183 Chronic kidney disease, stage 3 (moderate): Secondary | ICD-10-CM | POA: Diagnosis not present

## 2016-08-07 DIAGNOSIS — I129 Hypertensive chronic kidney disease with stage 1 through stage 4 chronic kidney disease, or unspecified chronic kidney disease: Secondary | ICD-10-CM | POA: Diagnosis not present

## 2016-08-07 DIAGNOSIS — D631 Anemia in chronic kidney disease: Secondary | ICD-10-CM | POA: Diagnosis not present

## 2016-08-07 DIAGNOSIS — E1122 Type 2 diabetes mellitus with diabetic chronic kidney disease: Secondary | ICD-10-CM | POA: Diagnosis not present

## 2016-08-13 DIAGNOSIS — I509 Heart failure, unspecified: Secondary | ICD-10-CM | POA: Diagnosis not present

## 2016-08-13 DIAGNOSIS — I2510 Atherosclerotic heart disease of native coronary artery without angina pectoris: Secondary | ICD-10-CM | POA: Diagnosis not present

## 2016-08-13 DIAGNOSIS — I420 Dilated cardiomyopathy: Secondary | ICD-10-CM | POA: Diagnosis not present

## 2016-08-13 DIAGNOSIS — I10 Essential (primary) hypertension: Secondary | ICD-10-CM | POA: Diagnosis not present

## 2016-08-13 DIAGNOSIS — R0602 Shortness of breath: Secondary | ICD-10-CM | POA: Diagnosis not present

## 2016-08-21 DIAGNOSIS — I420 Dilated cardiomyopathy: Secondary | ICD-10-CM | POA: Diagnosis not present

## 2016-08-21 DIAGNOSIS — E119 Type 2 diabetes mellitus without complications: Secondary | ICD-10-CM | POA: Diagnosis not present

## 2016-08-21 DIAGNOSIS — I509 Heart failure, unspecified: Secondary | ICD-10-CM | POA: Diagnosis not present

## 2016-08-21 DIAGNOSIS — E876 Hypokalemia: Secondary | ICD-10-CM | POA: Diagnosis not present

## 2016-08-21 DIAGNOSIS — R5383 Other fatigue: Secondary | ICD-10-CM | POA: Diagnosis not present

## 2016-08-21 DIAGNOSIS — M1990 Unspecified osteoarthritis, unspecified site: Secondary | ICD-10-CM | POA: Diagnosis not present

## 2016-08-26 DIAGNOSIS — E875 Hyperkalemia: Secondary | ICD-10-CM | POA: Diagnosis not present

## 2016-09-02 DIAGNOSIS — E875 Hyperkalemia: Secondary | ICD-10-CM | POA: Diagnosis not present

## 2016-09-03 ENCOUNTER — Other Ambulatory Visit: Payer: Self-pay | Admitting: Family Medicine

## 2016-09-03 NOTE — Telephone Encounter (Signed)
Received refill electronically Last refill 03/21/16 #60 Last office visit 09/28/14

## 2016-09-03 NOTE — Telephone Encounter (Signed)
Please schedule a June f/u and refill until then

## 2016-09-07 ENCOUNTER — Other Ambulatory Visit: Payer: Self-pay | Admitting: Family Medicine

## 2016-09-12 DIAGNOSIS — I10 Essential (primary) hypertension: Secondary | ICD-10-CM | POA: Diagnosis not present

## 2016-09-12 DIAGNOSIS — E785 Hyperlipidemia, unspecified: Secondary | ICD-10-CM | POA: Diagnosis not present

## 2016-09-12 DIAGNOSIS — R197 Diarrhea, unspecified: Secondary | ICD-10-CM | POA: Diagnosis not present

## 2016-09-12 DIAGNOSIS — E1165 Type 2 diabetes mellitus with hyperglycemia: Secondary | ICD-10-CM | POA: Diagnosis not present

## 2016-10-15 DIAGNOSIS — I509 Heart failure, unspecified: Secondary | ICD-10-CM | POA: Diagnosis not present

## 2016-10-15 DIAGNOSIS — I10 Essential (primary) hypertension: Secondary | ICD-10-CM | POA: Diagnosis not present

## 2016-10-15 DIAGNOSIS — E119 Type 2 diabetes mellitus without complications: Secondary | ICD-10-CM | POA: Diagnosis not present

## 2016-10-15 DIAGNOSIS — E785 Hyperlipidemia, unspecified: Secondary | ICD-10-CM | POA: Diagnosis not present

## 2016-10-15 DIAGNOSIS — I2510 Atherosclerotic heart disease of native coronary artery without angina pectoris: Secondary | ICD-10-CM | POA: Diagnosis not present

## 2016-10-28 DIAGNOSIS — E785 Hyperlipidemia, unspecified: Secondary | ICD-10-CM | POA: Diagnosis not present

## 2016-10-28 DIAGNOSIS — I509 Heart failure, unspecified: Secondary | ICD-10-CM | POA: Diagnosis not present

## 2016-10-28 DIAGNOSIS — E119 Type 2 diabetes mellitus without complications: Secondary | ICD-10-CM | POA: Diagnosis not present

## 2016-10-28 DIAGNOSIS — I10 Essential (primary) hypertension: Secondary | ICD-10-CM | POA: Diagnosis not present

## 2016-11-04 DIAGNOSIS — E119 Type 2 diabetes mellitus without complications: Secondary | ICD-10-CM | POA: Diagnosis not present

## 2016-11-04 DIAGNOSIS — M25561 Pain in right knee: Secondary | ICD-10-CM | POA: Diagnosis not present

## 2016-11-04 DIAGNOSIS — I509 Heart failure, unspecified: Secondary | ICD-10-CM | POA: Diagnosis not present

## 2016-11-14 DIAGNOSIS — E119 Type 2 diabetes mellitus without complications: Secondary | ICD-10-CM | POA: Diagnosis not present

## 2016-11-14 DIAGNOSIS — I739 Peripheral vascular disease, unspecified: Secondary | ICD-10-CM | POA: Diagnosis not present

## 2016-11-14 DIAGNOSIS — I10 Essential (primary) hypertension: Secondary | ICD-10-CM | POA: Diagnosis not present

## 2016-11-14 DIAGNOSIS — I420 Dilated cardiomyopathy: Secondary | ICD-10-CM | POA: Diagnosis not present

## 2016-11-14 DIAGNOSIS — I509 Heart failure, unspecified: Secondary | ICD-10-CM | POA: Diagnosis not present

## 2016-11-14 DIAGNOSIS — I2510 Atherosclerotic heart disease of native coronary artery without angina pectoris: Secondary | ICD-10-CM | POA: Diagnosis not present

## 2016-11-14 DIAGNOSIS — E785 Hyperlipidemia, unspecified: Secondary | ICD-10-CM | POA: Diagnosis not present

## 2016-11-14 DIAGNOSIS — R079 Chest pain, unspecified: Secondary | ICD-10-CM | POA: Diagnosis not present

## 2016-11-14 DIAGNOSIS — E559 Vitamin D deficiency, unspecified: Secondary | ICD-10-CM | POA: Diagnosis not present

## 2016-11-14 DIAGNOSIS — R5383 Other fatigue: Secondary | ICD-10-CM | POA: Diagnosis not present

## 2016-11-15 DIAGNOSIS — I2510 Atherosclerotic heart disease of native coronary artery without angina pectoris: Secondary | ICD-10-CM | POA: Diagnosis not present

## 2016-11-15 DIAGNOSIS — R079 Chest pain, unspecified: Secondary | ICD-10-CM | POA: Diagnosis not present

## 2016-11-15 DIAGNOSIS — I420 Dilated cardiomyopathy: Secondary | ICD-10-CM | POA: Diagnosis not present

## 2016-11-18 DIAGNOSIS — K210 Gastro-esophageal reflux disease with esophagitis: Secondary | ICD-10-CM | POA: Diagnosis not present

## 2016-11-18 DIAGNOSIS — I420 Dilated cardiomyopathy: Secondary | ICD-10-CM | POA: Diagnosis not present

## 2016-11-18 DIAGNOSIS — I509 Heart failure, unspecified: Secondary | ICD-10-CM | POA: Diagnosis not present

## 2016-11-18 DIAGNOSIS — I10 Essential (primary) hypertension: Secondary | ICD-10-CM | POA: Diagnosis not present

## 2016-11-21 DIAGNOSIS — I10 Essential (primary) hypertension: Secondary | ICD-10-CM | POA: Diagnosis not present

## 2016-11-21 DIAGNOSIS — I509 Heart failure, unspecified: Secondary | ICD-10-CM | POA: Diagnosis not present

## 2016-11-21 DIAGNOSIS — E1122 Type 2 diabetes mellitus with diabetic chronic kidney disease: Secondary | ICD-10-CM | POA: Diagnosis not present

## 2016-11-21 DIAGNOSIS — E785 Hyperlipidemia, unspecified: Secondary | ICD-10-CM | POA: Diagnosis not present

## 2016-11-26 DIAGNOSIS — D631 Anemia in chronic kidney disease: Secondary | ICD-10-CM | POA: Diagnosis not present

## 2016-11-26 DIAGNOSIS — N183 Chronic kidney disease, stage 3 (moderate): Secondary | ICD-10-CM | POA: Diagnosis not present

## 2016-11-26 DIAGNOSIS — E1122 Type 2 diabetes mellitus with diabetic chronic kidney disease: Secondary | ICD-10-CM | POA: Diagnosis not present

## 2016-11-26 DIAGNOSIS — I129 Hypertensive chronic kidney disease with stage 1 through stage 4 chronic kidney disease, or unspecified chronic kidney disease: Secondary | ICD-10-CM | POA: Diagnosis not present

## 2016-12-26 DIAGNOSIS — R197 Diarrhea, unspecified: Secondary | ICD-10-CM | POA: Diagnosis not present

## 2016-12-26 DIAGNOSIS — E1122 Type 2 diabetes mellitus with diabetic chronic kidney disease: Secondary | ICD-10-CM | POA: Diagnosis not present

## 2016-12-26 DIAGNOSIS — I10 Essential (primary) hypertension: Secondary | ICD-10-CM | POA: Diagnosis not present

## 2016-12-26 DIAGNOSIS — E785 Hyperlipidemia, unspecified: Secondary | ICD-10-CM | POA: Diagnosis not present

## 2017-01-01 DIAGNOSIS — R109 Unspecified abdominal pain: Secondary | ICD-10-CM | POA: Diagnosis not present

## 2017-01-07 DIAGNOSIS — E785 Hyperlipidemia, unspecified: Secondary | ICD-10-CM | POA: Diagnosis not present

## 2017-01-07 DIAGNOSIS — E1122 Type 2 diabetes mellitus with diabetic chronic kidney disease: Secondary | ICD-10-CM | POA: Diagnosis not present

## 2017-01-07 DIAGNOSIS — R197 Diarrhea, unspecified: Secondary | ICD-10-CM | POA: Diagnosis not present

## 2017-01-07 DIAGNOSIS — I10 Essential (primary) hypertension: Secondary | ICD-10-CM | POA: Diagnosis not present

## 2017-01-16 DIAGNOSIS — Z1212 Encounter for screening for malignant neoplasm of rectum: Secondary | ICD-10-CM | POA: Diagnosis not present

## 2017-01-16 DIAGNOSIS — Z1211 Encounter for screening for malignant neoplasm of colon: Secondary | ICD-10-CM | POA: Diagnosis not present

## 2017-04-16 ENCOUNTER — Other Ambulatory Visit: Payer: Self-pay | Admitting: *Deleted

## 2017-06-24 ENCOUNTER — Other Ambulatory Visit: Payer: Self-pay | Admitting: Internal Medicine

## 2017-06-24 DIAGNOSIS — Z1231 Encounter for screening mammogram for malignant neoplasm of breast: Secondary | ICD-10-CM

## 2017-08-28 IMAGING — CT CT ANGIO CHEST
2 of 6 series · 18 of 46 positions shown · IV contrast (APPLIED)
Comparison: Chest x-ray March 16, 2016

CLINICAL DATA: Cough and shortness of breath. Acute hypoxic
respiratory failure.

EXAM:
CT ANGIOGRAPHY CHEST WITH CONTRAST
TECHNIQUE: Multidetector CT imaging of the chest was performed using the
standard protocol during bolus administration of intravenous
contrast. Multiplanar CT image reconstructions and MIPs were
obtained to evaluate the vascular anatomy.
CONTRAST:  75 mL of Isovue 370

[Series 5: thins · axial · 0.70mm/px · z∈[-998,-736]mm · 16 of 288 slices shown]
[im 13/288  lung]
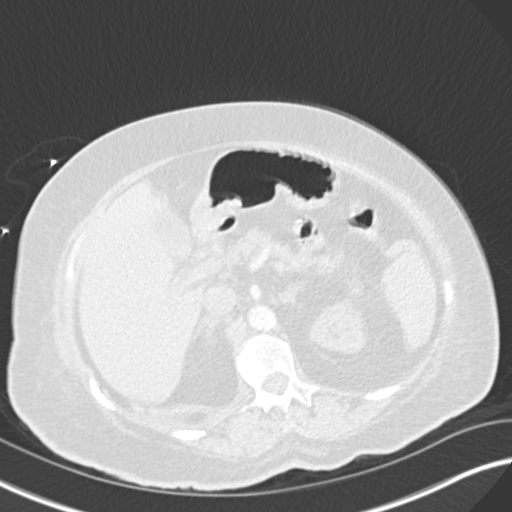
[im 38/288  soft-tissue]
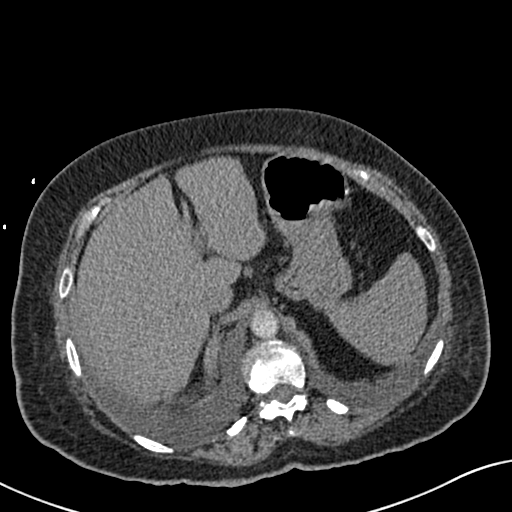
[im 50/288  lung]
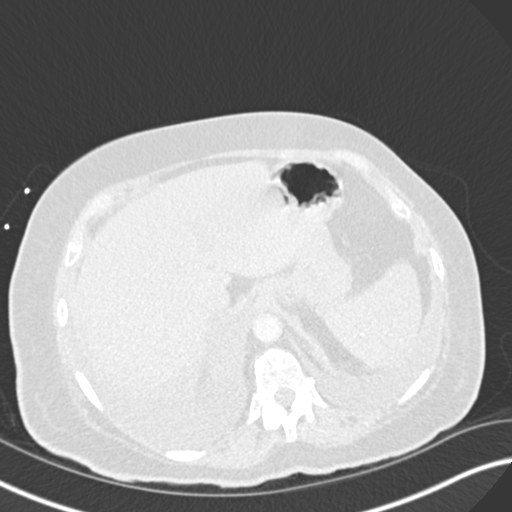
[im 63/288  soft-tissue]
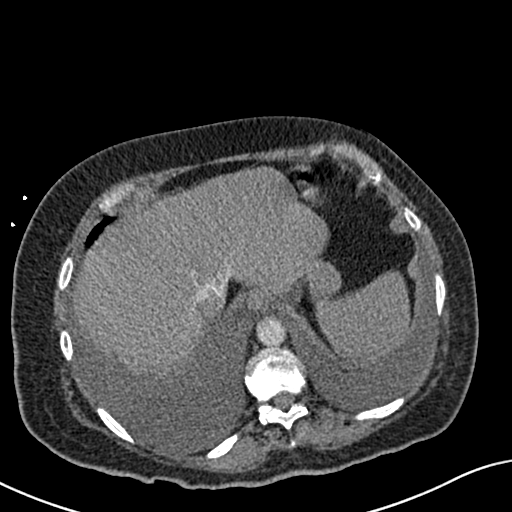
[im 88/288  lung]
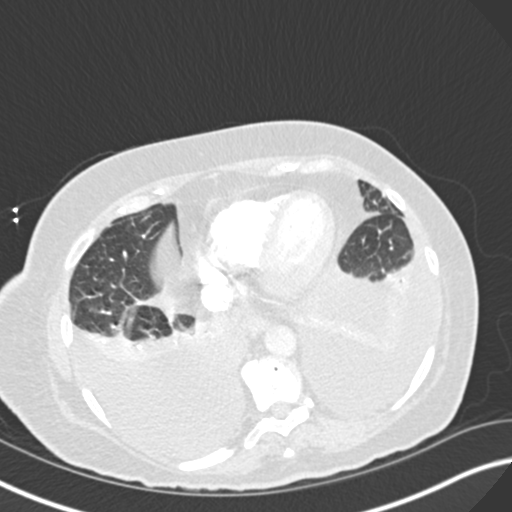
[im 100/288  soft-tissue]
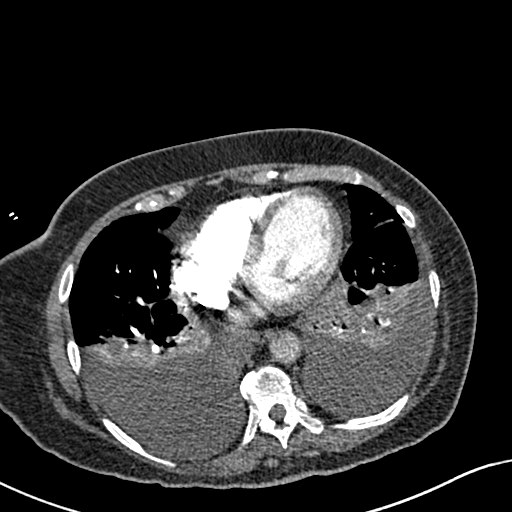
[im 113/288  lung]
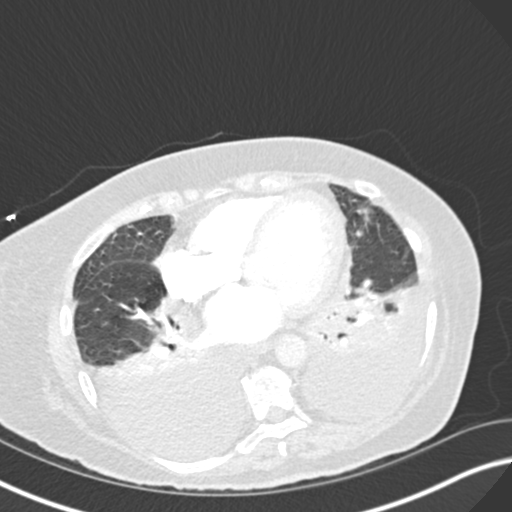
[im 138/288  soft-tissue]
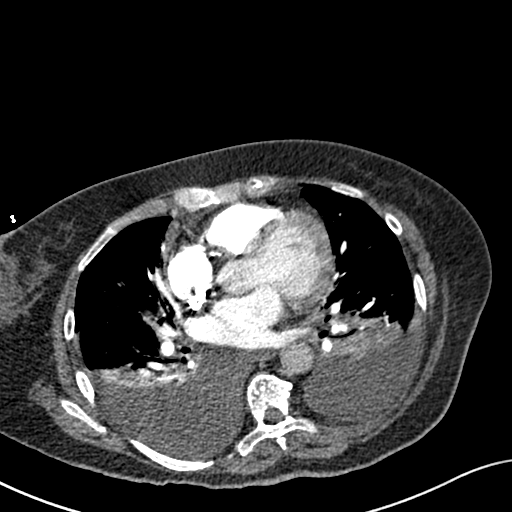
[im 150/288  lung]
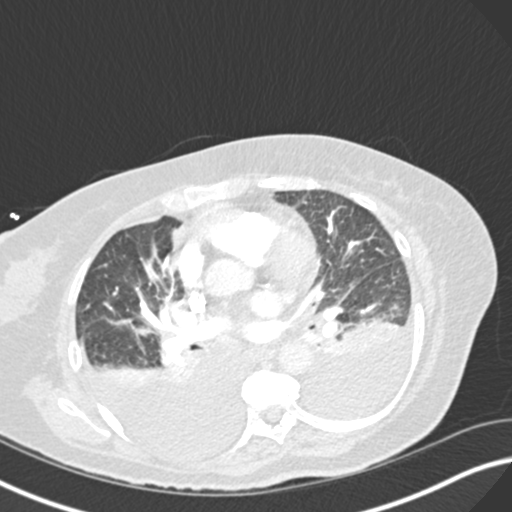
[im 175/288  soft-tissue]
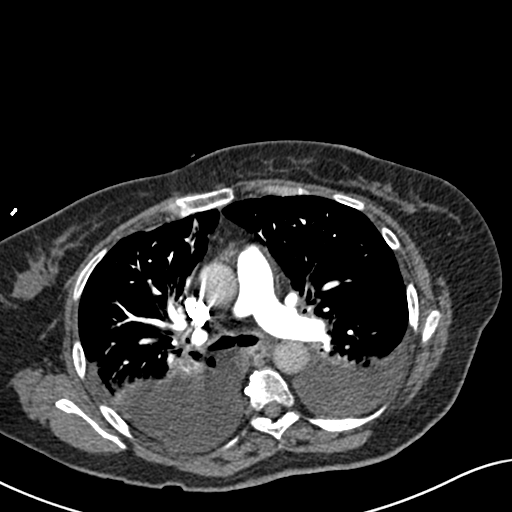
[im 188/288  lung]
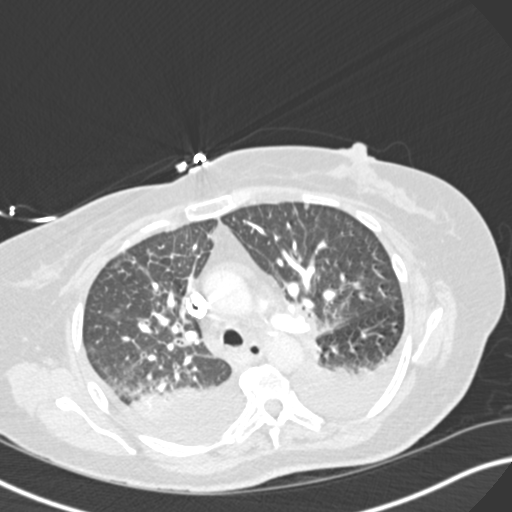
[im 200/288  soft-tissue]
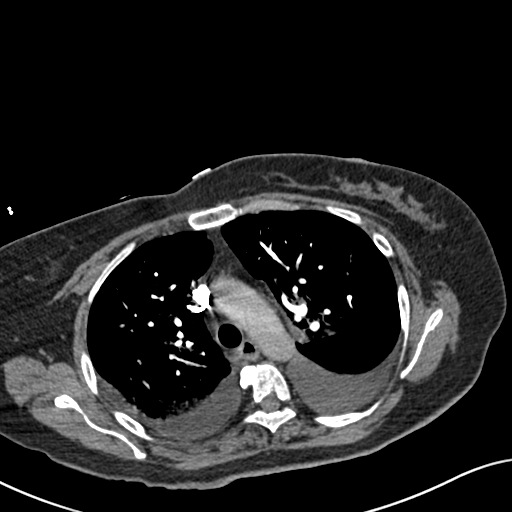
[im 225/288  lung]
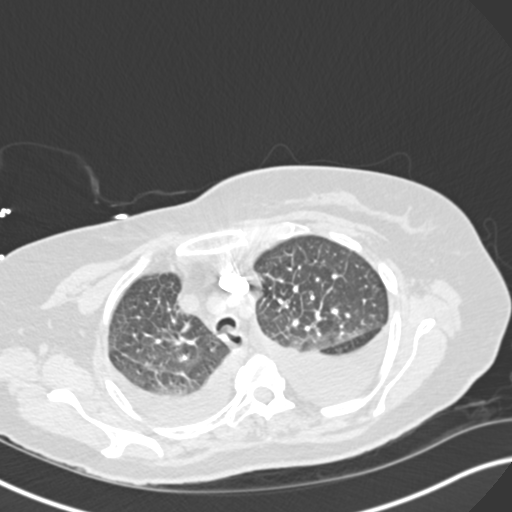
[im 238/288  soft-tissue]
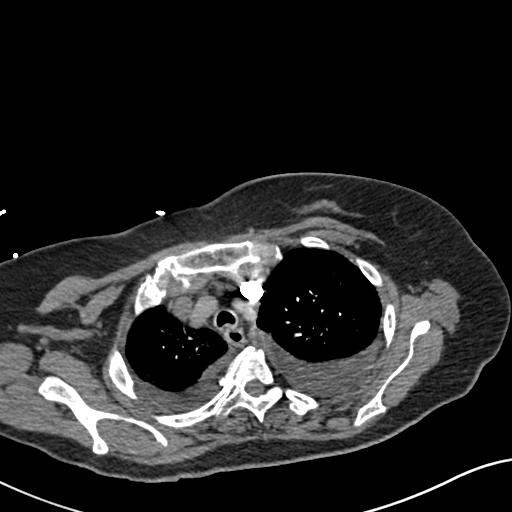
[im 250/288  lung]
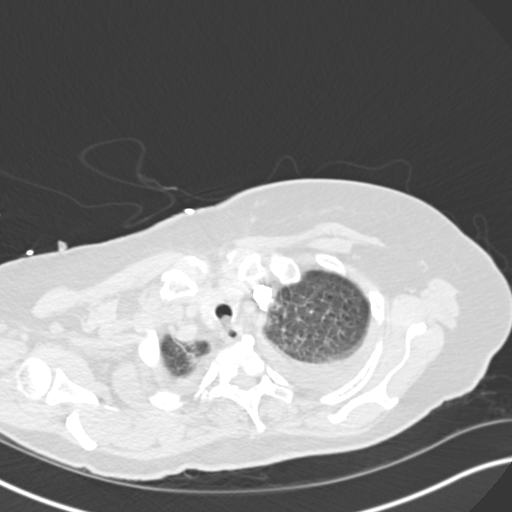
[im 275/288  soft-tissue]
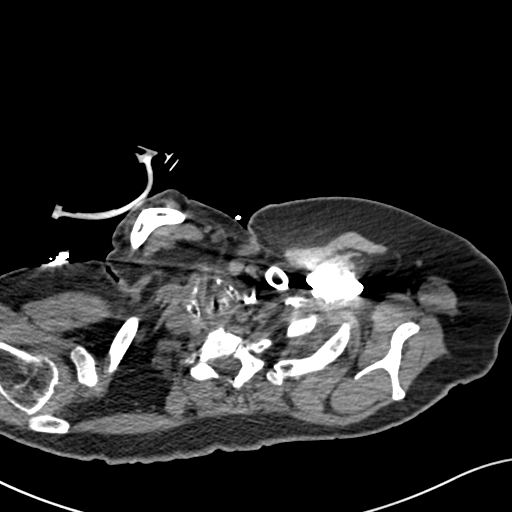

[Series 7: coronal mpr · coronal · 0.58mm/px · 2 of 81 slices shown]
[im 27/81  soft-tissue]
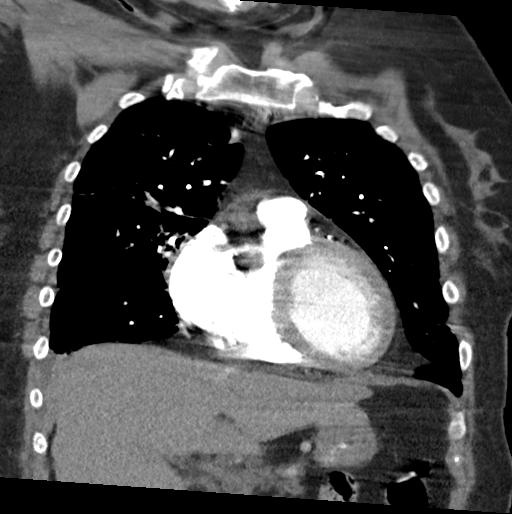
[im 54/81  soft-tissue]
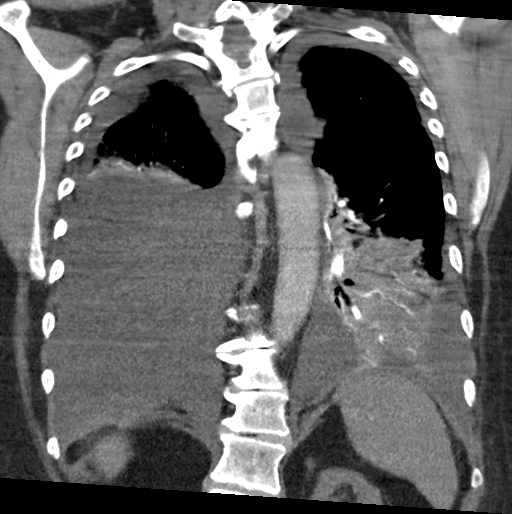

[18 of 46 positions shown; findings below may reference images not displayed]

FINDINGS: Cardiovascular: Coronary artery calcifications identified. The heart
size is borderline but not grossly enlarged. The thoracic aorta is
normal in caliber. The aorta is poorly enhanced but no evidence of
dissection is identified. Evaluation of the lower lobe pulmonary
arteries is limited due to significant compressive atelectasis
associated with the lower lobe effusions. Within these limitations,
no pulmonary emboli identified.

Mediastinum/Nodes: No adenopathy in the chest. The esophagus is
normal. No pericardial effusion.

Lungs/Pleura: Bilateral moderate-sized pleural effusions with
atelectasis are identified, much larger in the 2 day interval. Mild
interlobular septal thickening. Tiny ground-glass nodule in the
anterior left lung measuring 3 mm on series 6, image 35. No other
abnormalities within the lung parenchyma. Scattered atelectasis.
Central airways are unremarkable. No pneumothorax.

Upper Abdomen: Limited views of the upper abdomen demonstrated a few
mildly prominent porta hepatis nodes such as on series 4, image 90
measuring 11 mm in short axis. No other abnormalities.

Musculoskeletal: Degenerative changes in the spine.

Review of the MIP images confirms the above findings.
IMPRESSION: 1. Moderate bilateral pleural effusions, significantly larger in the
2 day interval. Interlobular septal thickening. Volume overload
should be considered. However, the heart is borderline but not
grossly enlarged. Recommend clinical correlation.
2. No pulmonary emboli identified.
3. 3 mm ground-glass nodule in the left lung. No follow-up
recommended. This recommendation follows the consensus statement:
Guidelines for Management of Incidental Pulmonary Nodules Detected
[DATE].
4. Mildly prominent porta hepatis nodes measuring up to 11 mm. These
are nonspecific and incompletely evaluated.

## 2017-08-29 IMAGING — DX DG CHEST 1V PORT
1 series · 1 of 1 positions shown · non-contrast
Comparison: 03/16/2016

CLINICAL DATA: Pleural effusion

EXAM:
PORTABLE CHEST 1 VIEW

[chest ap]
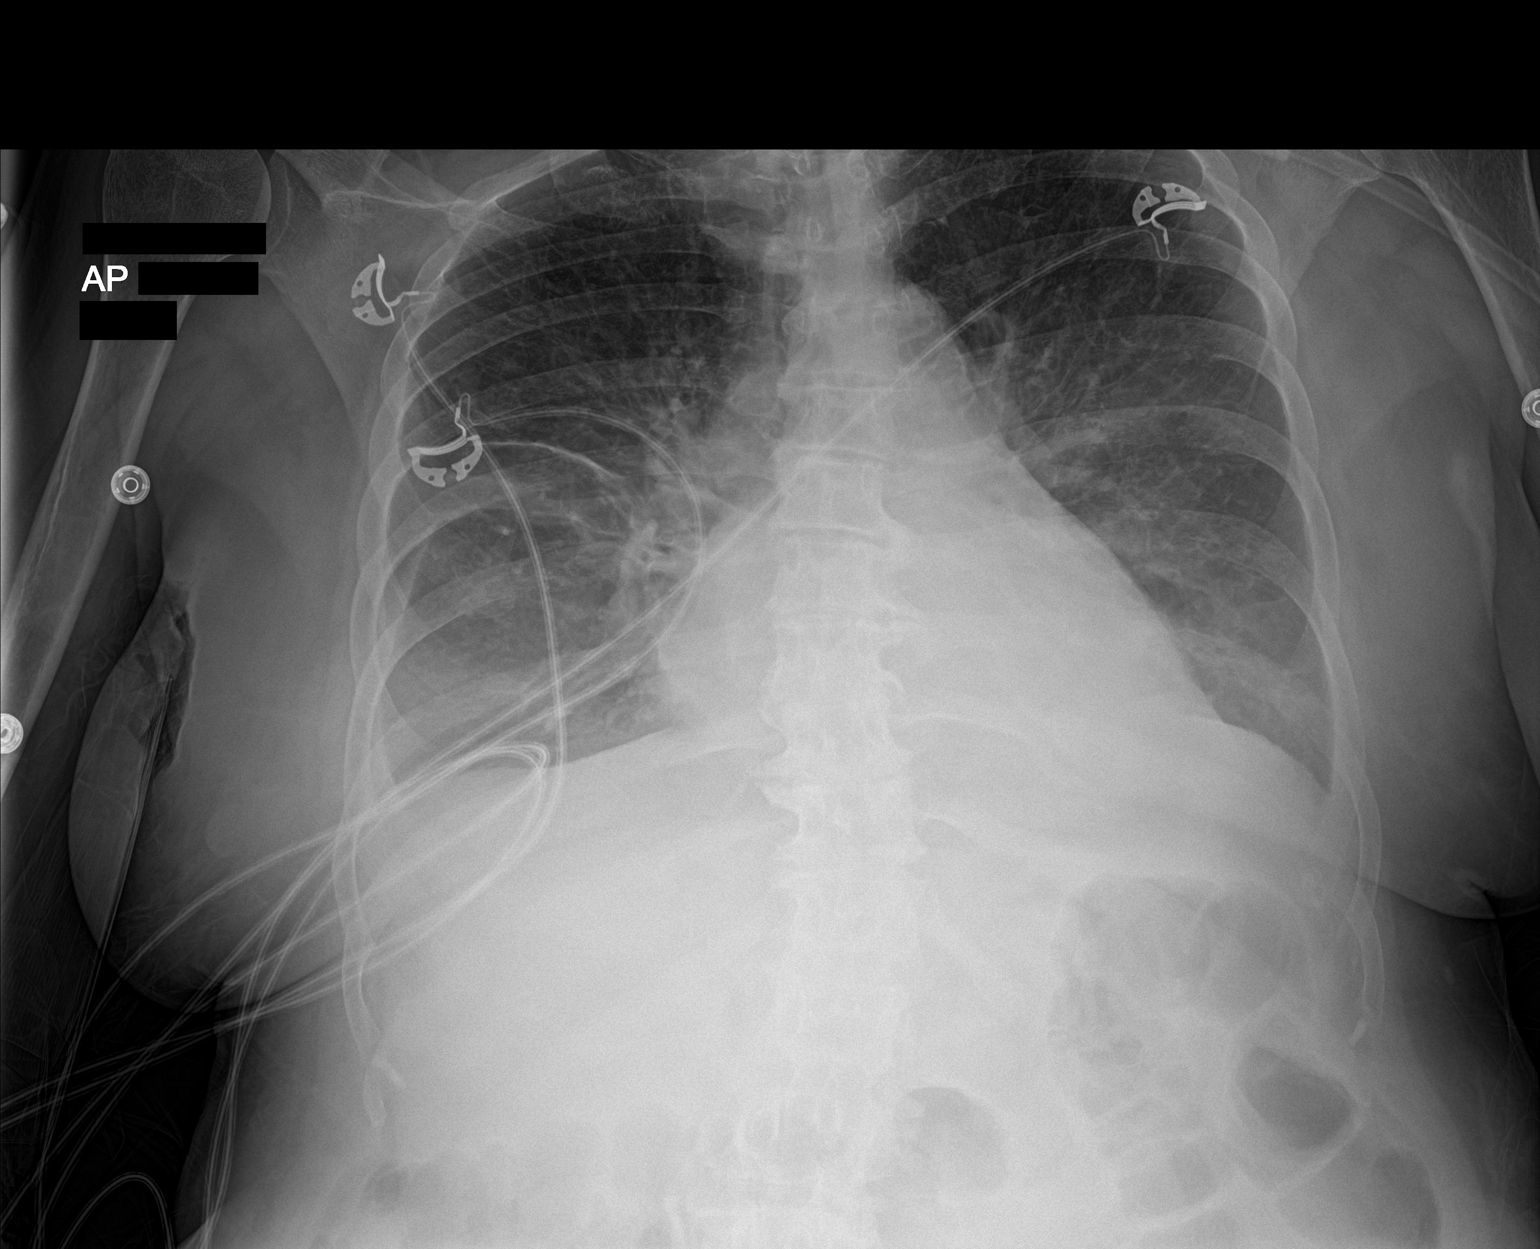

[1 of 1 positions shown; findings below may reference images not displayed]

FINDINGS: Small bilateral pleural effusions. Bibasilar opacities, likely
atelectasis. Heart is upper limits normal in size. No acute bony
abnormality.
IMPRESSION: Small bilateral pleural effusions with bibasilar atelectasis.

## 2017-08-29 IMAGING — US US THORACENTESIS ASP PLEURAL SPACE W/IMG GUIDE
1 series · 4 of 4 positions shown · non-contrast
Comparison: none

INDICATION: 80-year-old female with shortness of breath and heart disease.
Bilateral pleural effusions. Request for a thoracentesis.

[Series 1: us thoracentesis asp pleural space w/img guide · 0.25mm/px · 4 of 4 slices shown]
[im 1/4]
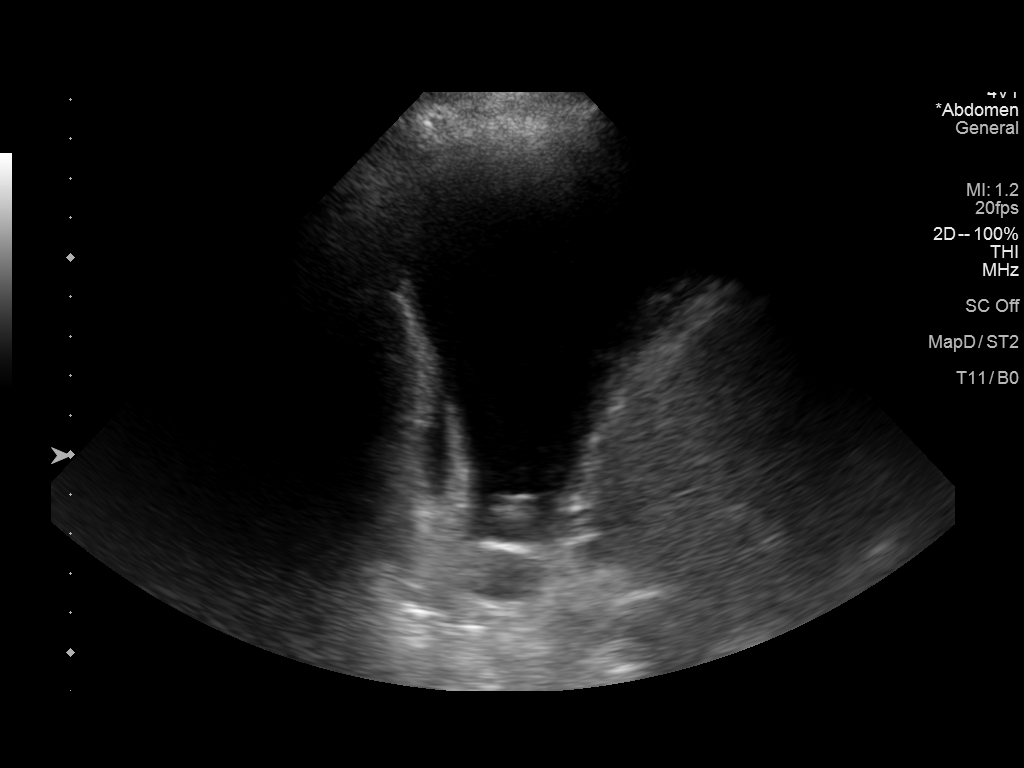
[im 2/4]
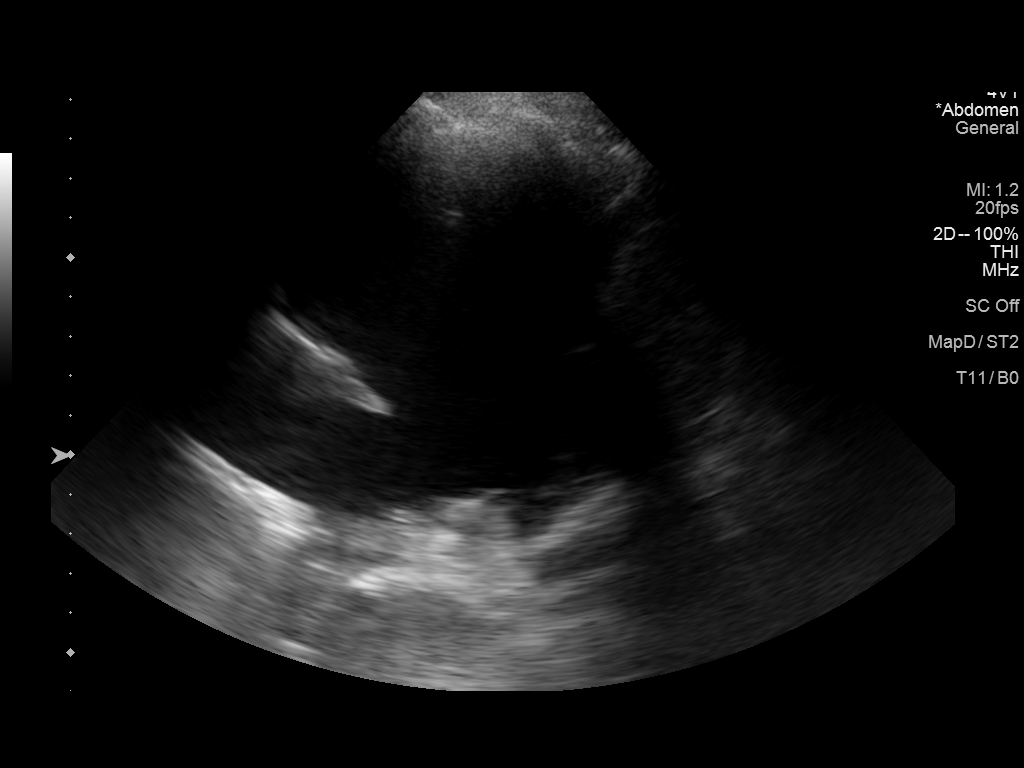
[im 3/4]
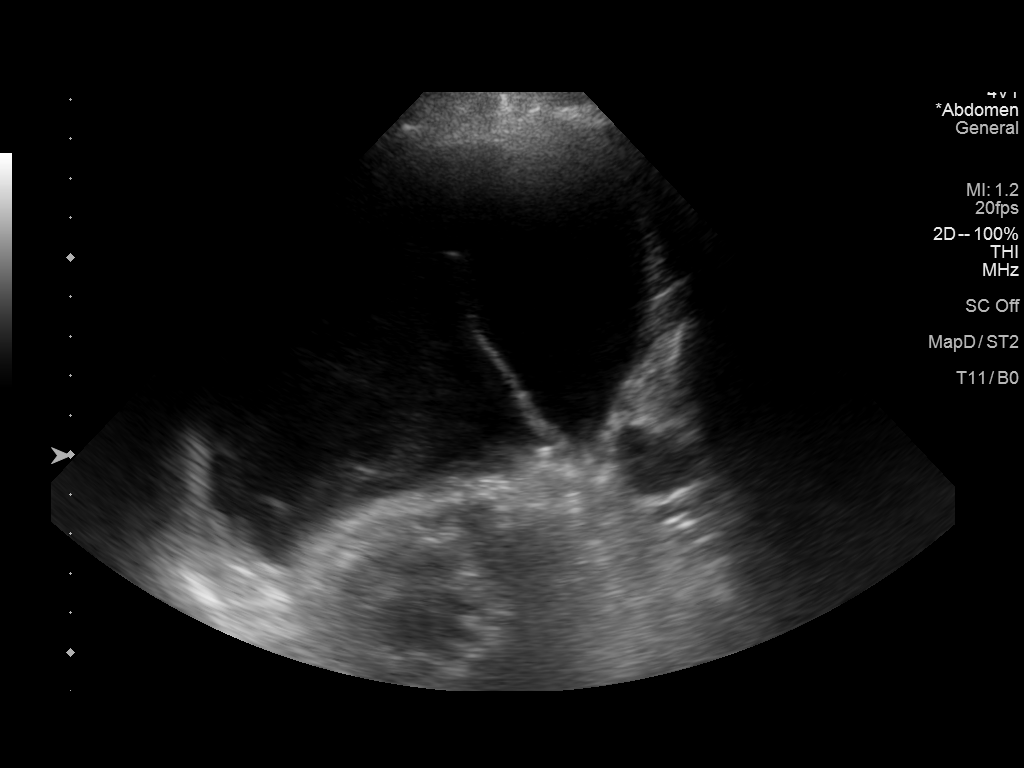
[im 4/4]
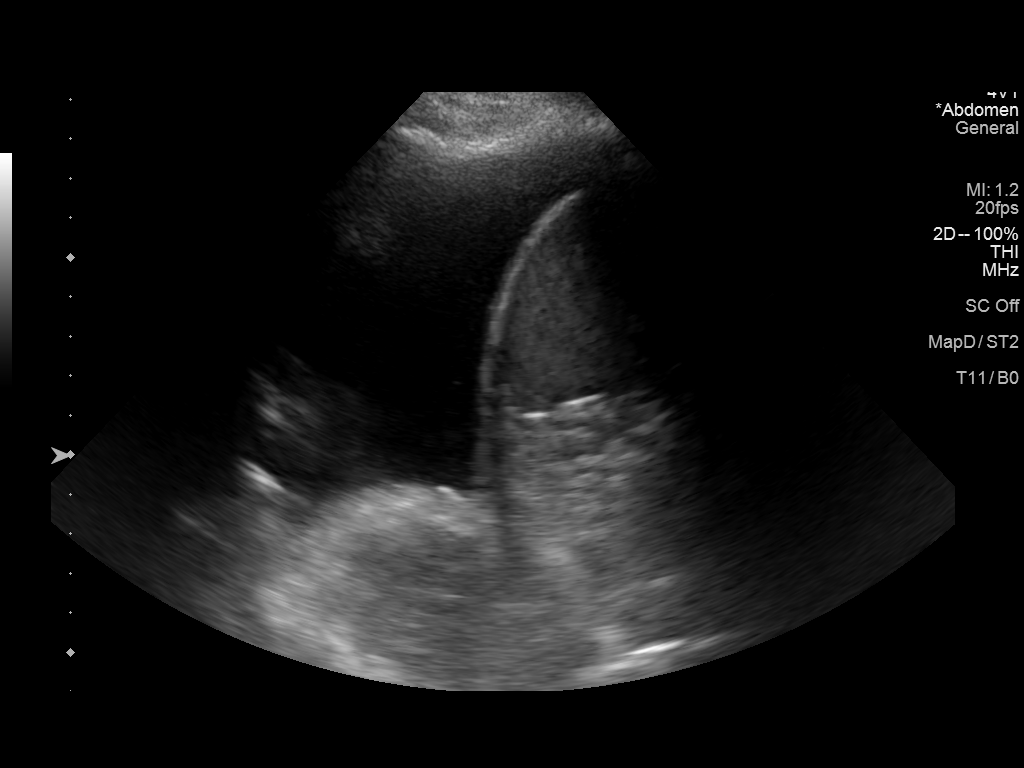

[4 of 4 positions shown; findings below may reference images not displayed]

EXAM:
ULTRASOUND GUIDED LEFT THORACENTESIS

MEDICATIONS:
None.

COMPLICATIONS:
None immediate.

PROCEDURE:
An ultrasound guided thoracentesis was thoroughly discussed with the
patient and questions answered. The benefits, risks, alternatives
and complications were also discussed. The patient understands and
wishes to proceed with the procedure. Written consent was obtained.

Ultrasound was performed to localize and mark an adequate pocket of
fluid in the left chest. Patient has allergies to local anesthetics.
Therefore, the marked area was numbed using an ice pack prior to the
puncture. The area was then prepped and draped in the normal sterile
fashion. Under ultrasound guidance a 6 Fr Safe-T-Centesis catheter
was introduced. Thoracentesis was performed. The catheter was
removed and a dressing applied.
FINDINGS: A total of approximately 600 mL of yellow fluid was removed. Samples
were sent to the laboratory as requested by the clinical team.
IMPRESSION: Successful ultrasound guided left thoracentesis yielding 600 mL of
pleural fluid.

## 2017-09-11 ENCOUNTER — Other Ambulatory Visit: Payer: Self-pay | Admitting: Family Medicine

## 2017-09-11 NOTE — Telephone Encounter (Signed)
I do not see a PCP for her- may want to call and ask her so we know were pharm should send req

## 2017-09-11 NOTE — Telephone Encounter (Signed)
Pt hasn't been seen since 2017 and no future appts. also you are not list as PCP anymore, please advise

## 2017-09-11 NOTE — Telephone Encounter (Signed)
Left VM requesting pt to call the office back, CRM created 

## 2017-09-11 NOTE — Telephone Encounter (Signed)
She said she doesn't know why the request was sent because she does not see Tower anymore. She apologies. She said disregard.

## 2019-09-17 ENCOUNTER — Other Ambulatory Visit: Payer: Self-pay | Admitting: Internal Medicine

## 2019-09-17 DIAGNOSIS — Z1231 Encounter for screening mammogram for malignant neoplasm of breast: Secondary | ICD-10-CM

## 2019-09-30 ENCOUNTER — Ambulatory Visit
Admission: RE | Admit: 2019-09-30 | Discharge: 2019-09-30 | Disposition: A | Discharge status: Home / Self Care | Payer: Medicare HMO | Source: Ambulatory Visit | Attending: Internal Medicine | Admitting: Internal Medicine

## 2019-09-30 DIAGNOSIS — Z1231 Encounter for screening mammogram for malignant neoplasm of breast: Secondary | ICD-10-CM | POA: Diagnosis present

## 2020-10-09 DIAGNOSIS — Z0100 Encounter for examination of eyes and vision without abnormal findings: Secondary | ICD-10-CM | POA: Diagnosis not present

## 2020-10-09 DIAGNOSIS — E119 Type 2 diabetes mellitus without complications: Secondary | ICD-10-CM | POA: Diagnosis not present

## 2020-10-17 ENCOUNTER — Other Ambulatory Visit: Payer: Self-pay | Admitting: Internal Medicine

## 2020-10-17 DIAGNOSIS — E559 Vitamin D deficiency, unspecified: Secondary | ICD-10-CM | POA: Diagnosis not present

## 2020-10-17 DIAGNOSIS — I10 Essential (primary) hypertension: Secondary | ICD-10-CM | POA: Diagnosis not present

## 2020-10-17 DIAGNOSIS — I2510 Atherosclerotic heart disease of native coronary artery without angina pectoris: Secondary | ICD-10-CM | POA: Diagnosis not present

## 2020-10-17 DIAGNOSIS — E785 Hyperlipidemia, unspecified: Secondary | ICD-10-CM | POA: Diagnosis not present

## 2020-10-17 DIAGNOSIS — E1122 Type 2 diabetes mellitus with diabetic chronic kidney disease: Secondary | ICD-10-CM | POA: Diagnosis not present

## 2020-10-17 DIAGNOSIS — Z1231 Encounter for screening mammogram for malignant neoplasm of breast: Secondary | ICD-10-CM

## 2020-10-26 ENCOUNTER — Ambulatory Visit
Admission: RE | Admit: 2020-10-26 | Discharge: 2020-10-26 | Disposition: A | Discharge status: Home / Self Care | Payer: Medicare HMO | Source: Ambulatory Visit | Attending: Internal Medicine | Admitting: Internal Medicine

## 2020-10-26 ENCOUNTER — Other Ambulatory Visit: Payer: Self-pay

## 2020-10-26 DIAGNOSIS — Z1231 Encounter for screening mammogram for malignant neoplasm of breast: Secondary | ICD-10-CM | POA: Insufficient documentation

## 2020-11-23 DIAGNOSIS — E1122 Type 2 diabetes mellitus with diabetic chronic kidney disease: Secondary | ICD-10-CM | POA: Diagnosis not present

## 2020-11-23 DIAGNOSIS — I10 Essential (primary) hypertension: Secondary | ICD-10-CM | POA: Diagnosis not present

## 2020-11-23 DIAGNOSIS — E785 Hyperlipidemia, unspecified: Secondary | ICD-10-CM | POA: Diagnosis not present

## 2020-12-07 DIAGNOSIS — I509 Heart failure, unspecified: Secondary | ICD-10-CM | POA: Diagnosis not present

## 2020-12-07 DIAGNOSIS — I10 Essential (primary) hypertension: Secondary | ICD-10-CM | POA: Diagnosis not present

## 2020-12-07 DIAGNOSIS — E1122 Type 2 diabetes mellitus with diabetic chronic kidney disease: Secondary | ICD-10-CM | POA: Diagnosis not present

## 2020-12-07 DIAGNOSIS — E785 Hyperlipidemia, unspecified: Secondary | ICD-10-CM | POA: Diagnosis not present

## 2021-01-18 DIAGNOSIS — I10 Essential (primary) hypertension: Secondary | ICD-10-CM | POA: Diagnosis not present

## 2021-01-18 DIAGNOSIS — E785 Hyperlipidemia, unspecified: Secondary | ICD-10-CM | POA: Diagnosis not present

## 2021-01-18 DIAGNOSIS — I2510 Atherosclerotic heart disease of native coronary artery without angina pectoris: Secondary | ICD-10-CM | POA: Diagnosis not present

## 2021-01-18 DIAGNOSIS — E1122 Type 2 diabetes mellitus with diabetic chronic kidney disease: Secondary | ICD-10-CM | POA: Diagnosis not present

## 2021-03-05 DIAGNOSIS — E785 Hyperlipidemia, unspecified: Secondary | ICD-10-CM | POA: Diagnosis not present

## 2021-03-05 DIAGNOSIS — E1122 Type 2 diabetes mellitus with diabetic chronic kidney disease: Secondary | ICD-10-CM | POA: Diagnosis not present

## 2021-03-05 DIAGNOSIS — I10 Essential (primary) hypertension: Secondary | ICD-10-CM | POA: Diagnosis not present

## 2021-03-05 DIAGNOSIS — I509 Heart failure, unspecified: Secondary | ICD-10-CM | POA: Diagnosis not present

## 2021-03-19 DIAGNOSIS — I509 Heart failure, unspecified: Secondary | ICD-10-CM | POA: Diagnosis not present

## 2021-03-19 DIAGNOSIS — I10 Essential (primary) hypertension: Secondary | ICD-10-CM | POA: Diagnosis not present

## 2021-03-19 DIAGNOSIS — E785 Hyperlipidemia, unspecified: Secondary | ICD-10-CM | POA: Diagnosis not present

## 2021-03-19 DIAGNOSIS — E1122 Type 2 diabetes mellitus with diabetic chronic kidney disease: Secondary | ICD-10-CM | POA: Diagnosis not present

## 2021-03-24 DIAGNOSIS — K047 Periapical abscess without sinus: Secondary | ICD-10-CM | POA: Diagnosis not present

## 2021-03-24 DIAGNOSIS — R220 Localized swelling, mass and lump, head: Secondary | ICD-10-CM | POA: Diagnosis not present

## 2021-04-19 DIAGNOSIS — E785 Hyperlipidemia, unspecified: Secondary | ICD-10-CM | POA: Diagnosis not present

## 2021-04-19 DIAGNOSIS — I10 Essential (primary) hypertension: Secondary | ICD-10-CM | POA: Diagnosis not present

## 2021-04-19 DIAGNOSIS — I509 Heart failure, unspecified: Secondary | ICD-10-CM | POA: Diagnosis not present

## 2021-04-19 DIAGNOSIS — E1122 Type 2 diabetes mellitus with diabetic chronic kidney disease: Secondary | ICD-10-CM | POA: Diagnosis not present

## 2021-05-03 DIAGNOSIS — E1122 Type 2 diabetes mellitus with diabetic chronic kidney disease: Secondary | ICD-10-CM | POA: Diagnosis not present

## 2021-05-03 DIAGNOSIS — E785 Hyperlipidemia, unspecified: Secondary | ICD-10-CM | POA: Diagnosis not present

## 2021-05-03 DIAGNOSIS — I10 Essential (primary) hypertension: Secondary | ICD-10-CM | POA: Diagnosis not present

## 2021-05-03 DIAGNOSIS — I2510 Atherosclerotic heart disease of native coronary artery without angina pectoris: Secondary | ICD-10-CM | POA: Diagnosis not present

## 2021-07-02 DIAGNOSIS — E785 Hyperlipidemia, unspecified: Secondary | ICD-10-CM | POA: Diagnosis not present

## 2021-07-02 DIAGNOSIS — E1122 Type 2 diabetes mellitus with diabetic chronic kidney disease: Secondary | ICD-10-CM | POA: Diagnosis not present

## 2021-07-02 DIAGNOSIS — I509 Heart failure, unspecified: Secondary | ICD-10-CM | POA: Diagnosis not present

## 2021-07-02 DIAGNOSIS — I10 Essential (primary) hypertension: Secondary | ICD-10-CM | POA: Diagnosis not present

## 2021-08-10 DIAGNOSIS — R5383 Other fatigue: Secondary | ICD-10-CM | POA: Diagnosis not present

## 2021-08-10 DIAGNOSIS — E785 Hyperlipidemia, unspecified: Secondary | ICD-10-CM | POA: Diagnosis not present

## 2021-08-10 DIAGNOSIS — I10 Essential (primary) hypertension: Secondary | ICD-10-CM | POA: Diagnosis not present

## 2021-08-10 DIAGNOSIS — E1122 Type 2 diabetes mellitus with diabetic chronic kidney disease: Secondary | ICD-10-CM | POA: Diagnosis not present

## 2021-08-13 ENCOUNTER — Other Ambulatory Visit: Payer: Self-pay | Admitting: Internal Medicine

## 2021-08-13 DIAGNOSIS — Z1231 Encounter for screening mammogram for malignant neoplasm of breast: Secondary | ICD-10-CM

## 2021-08-13 DIAGNOSIS — E785 Hyperlipidemia, unspecified: Secondary | ICD-10-CM | POA: Diagnosis not present

## 2021-08-13 DIAGNOSIS — E1122 Type 2 diabetes mellitus with diabetic chronic kidney disease: Secondary | ICD-10-CM | POA: Diagnosis not present

## 2021-08-13 DIAGNOSIS — I2510 Atherosclerotic heart disease of native coronary artery without angina pectoris: Secondary | ICD-10-CM | POA: Diagnosis not present

## 2021-08-13 DIAGNOSIS — I10 Essential (primary) hypertension: Secondary | ICD-10-CM | POA: Diagnosis not present

## 2021-08-31 DIAGNOSIS — S6991XA Unspecified injury of right wrist, hand and finger(s), initial encounter: Secondary | ICD-10-CM | POA: Diagnosis not present

## 2021-08-31 DIAGNOSIS — S62524A Nondisplaced fracture of distal phalanx of right thumb, initial encounter for closed fracture: Secondary | ICD-10-CM | POA: Diagnosis not present

## 2021-08-31 DIAGNOSIS — W06XXXA Fall from bed, initial encounter: Secondary | ICD-10-CM | POA: Diagnosis not present

## 2021-08-31 DIAGNOSIS — S62514A Nondisplaced fracture of proximal phalanx of right thumb, initial encounter for closed fracture: Secondary | ICD-10-CM | POA: Diagnosis not present

## 2021-08-31 DIAGNOSIS — S62521A Displaced fracture of distal phalanx of right thumb, initial encounter for closed fracture: Secondary | ICD-10-CM | POA: Diagnosis not present

## 2021-10-01 ENCOUNTER — Emergency Department
Admission: EM | Admit: 2021-10-01 | Discharge: 2021-10-01 | Disposition: A | Discharge status: Home / Self Care | Payer: Medicare HMO | Attending: Emergency Medicine | Admitting: Emergency Medicine

## 2021-10-01 ENCOUNTER — Other Ambulatory Visit: Payer: Self-pay

## 2021-10-01 ENCOUNTER — Emergency Department: Payer: Medicare HMO

## 2021-10-01 DIAGNOSIS — Z7984 Long term (current) use of oral hypoglycemic drugs: Secondary | ICD-10-CM | POA: Diagnosis not present

## 2021-10-01 DIAGNOSIS — Z7982 Long term (current) use of aspirin: Secondary | ICD-10-CM | POA: Insufficient documentation

## 2021-10-01 DIAGNOSIS — W01198A Fall on same level from slipping, tripping and stumbling with subsequent striking against other object, initial encounter: Secondary | ICD-10-CM | POA: Insufficient documentation

## 2021-10-01 DIAGNOSIS — S0990XA Unspecified injury of head, initial encounter: Secondary | ICD-10-CM | POA: Insufficient documentation

## 2021-10-01 DIAGNOSIS — J45909 Unspecified asthma, uncomplicated: Secondary | ICD-10-CM | POA: Diagnosis not present

## 2021-10-01 DIAGNOSIS — Z79899 Other long term (current) drug therapy: Secondary | ICD-10-CM | POA: Diagnosis not present

## 2021-10-01 DIAGNOSIS — B888 Other specified infestations: Secondary | ICD-10-CM

## 2021-10-01 DIAGNOSIS — I10 Essential (primary) hypertension: Secondary | ICD-10-CM | POA: Insufficient documentation

## 2021-10-01 DIAGNOSIS — W19XXXA Unspecified fall, initial encounter: Secondary | ICD-10-CM

## 2021-10-01 DIAGNOSIS — E119 Type 2 diabetes mellitus without complications: Secondary | ICD-10-CM | POA: Insufficient documentation

## 2021-10-01 LAB — CBC
HCT: 34.6 % — ABNORMAL LOW (ref 36.0–46.0)
Hemoglobin: 11 g/dL — ABNORMAL LOW (ref 12.0–15.0)
MCH: 29.4 pg (ref 26.0–34.0)
MCHC: 31.8 g/dL (ref 30.0–36.0)
MCV: 92.5 fL (ref 80.0–100.0)
Platelets: 212 10*3/uL (ref 150–400)
RBC: 3.74 MIL/uL — ABNORMAL LOW (ref 3.87–5.11)
RDW: 14.6 % (ref 11.5–15.5)
WBC: 8.7 10*3/uL (ref 4.0–10.5)
nRBC: 0 % (ref 0.0–0.2)

## 2021-10-01 LAB — BASIC METABOLIC PANEL
Anion gap: 8 (ref 5–15)
BUN: 33 mg/dL — ABNORMAL HIGH (ref 8–23)
CO2: 25 mmol/L (ref 22–32)
Calcium: 9.5 mg/dL (ref 8.9–10.3)
Chloride: 105 mmol/L (ref 98–111)
Creatinine, Ser: 1.62 mg/dL — ABNORMAL HIGH (ref 0.44–1.00)
GFR, Estimated: 31 mL/min — ABNORMAL LOW (ref >60)
Glucose, Bld: 115 mg/dL — ABNORMAL HIGH (ref 70–99)
Potassium: 4.4 mmol/L (ref 3.5–5.1)
Sodium: 138 mmol/L (ref 135–145)

## 2021-10-01 LAB — TROPONIN I (HIGH SENSITIVITY): Troponin I (High Sensitivity): 4 ng/L (ref <18)

## 2021-10-01 MED ORDER — HYDROCODONE-ACETAMINOPHEN 5-325 MG PO TABS
1.0000 | ORAL_TABLET | Freq: Once | ORAL | Status: DC
  Filled 2021-10-01: qty 1

## 2021-10-01 NOTE — ED Notes (Signed)
Walked into room, patient began yelling stating she wanted to go home. Patient did not want to take the medication offered stating has been in pain. I told the patient I had pain medication for her, the patient stated she did not want the pain medication anymore and was going home. Patient began pulling at her IV. Friend at bedside grabbed the patients hand and she swatted at him stating to let her go. Patient also stated " I am through with you" and swatted her hand at this nurse. I explained to the patient that we would like to help her. I also asked why she was upset. The patient stated she has been suffering all night and was going home. I also asked the patient to wait and speak with the Dr. She stated she didn't want to speak to them either. IV was removed and patient waked out of the room with friend.

## 2021-10-01 NOTE — ED Triage Notes (Signed)
Patient was on toilet and became dizzy and fell hitting her head.  Patient complains of pain to left forehead and her neck.

## 2021-10-01 NOTE — ED Notes (Signed)
Patient unable to provide a urine sample at this time. Patient ambulated to the bathroom with assistance.

## 2021-10-01 NOTE — ED Notes (Signed)
Received call from lab, lavender top clotted and will need to be redrawn.

## 2021-10-01 NOTE — ED Provider Notes (Signed)
[facility] Provider Note    Event Date/Time   First MD Initiated Contact with Patient 10/01/21 [phone]     (approximate)   History   Dizziness and Head Injury   HPI  [name] is a 86 y.o. female with history of hypertension, diabetes, hyperlipidemia who presents emergency department after a fall and head injury that occurred just prior to arrival.  Patient comes in with her husband.  She states she got up to go to use the bathroom and fell off of the toilet.  She is not sure why she fell.  Is having some headache that she describes as a tightness after the head injury and took a Tylenol prior to arrival.  Not on any blood thinners.  She denies any chest pain, shortness of breath, current dizziness, nausea, vomiting, diarrhea, fevers, cough.  Husband states that this fall was unwitnessed.   History provided by patient and husband.    Past Medical History:  Diagnosis Date   Asthma    childhood   Depression    Diabetes mellitus    Type II   Foot fracture, right 3/13   H/O scoliosis    HLD (hyperlipidemia)    HTN (hypertension)    Osteoarthritis    hands,elbow,knees   Osteoporosis    Rhinitis, allergic    Scoliosis    chronic back pain    Past Surgical History:  Procedure Laterality Date   bladder tack     CARDIAC CATHETERIZATION Right 03/19/2016   Procedure: Right/Left Heart Cath and Coronary Angiography;  Surgeon: Laurier Nancy, MD;  Location: [facility] INVASIVE CV LAB;  Service: Cardiovascular;  Laterality: Right;   TUBAL LIGATION     bilateral    MEDICATIONS:  Prior to Admission medications   Medication Sig Start Date End Date Taking? Authorizing Provider  albuterol (PROVENTIL HFA;VENTOLIN HFA) 108 (90 BASE) MCG/ACT inhaler Inhale 2 puffs into the lungs every 4 (four) hours as needed for wheezing. 06/22/13   Tower, Audrie Gallus, MD  amitriptyline (ELAVIL) 25 MG tablet Take 1 tablet (25 mg total) by mouth at bedtime. 01/23/16   Tower, Audrie Gallus,  MD  aspirin 81 MG tablet Take 81 mg by mouth daily.      [provider]  atorvastatin (LIPITOR) 40 MG tablet TAKE 1 TABLET BY MOUTH DAILY Patient not taking: Reported on 03/16/2016 03/22/15   Nahser, Deloris Ping, MD  Blood Glucose Monitoring Suppl (ONETOUCH VERIO FLEX SYSTEM) W/DEVICE KIT 1 Device by Other route daily. Check blood sugar once daily and as directed. Dx E11.9 03/08/15   Tower, Audrie Gallus, MD  Calcium Carbonate-Vitamin D (CALCIUM 600+D) 600-400 MG-UNIT per tablet Take 2 tablets by mouth daily.      [provider]  carvedilol (COREG) 12.5 MG tablet Take 1 tablet (12.5 mg total) by mouth 2 (two) times daily with a meal. 03/21/16   Delfino Lovett, MD  cyanocobalamin (,VITAMIN B-12,) 1000 MCG/ML injection Inject 1 mL (1,000 mcg total) into the muscle every 6 (six) weeks. Patient not taking: Reported on 03/16/2016 02/20/15   Tower, Audrie Gallus, MD  digoxin 62.5 MCG TABS Take 0.0625 mg by mouth daily. 03/22/16   Delfino Lovett, MD  fish oil-omega-3 fatty acids 1000 MG capsule Take 1 capsule by mouth 2 (two) times daily.     [provider]  glipiZIDE (GLUCOTROL XL) 5 MG 24 hr tablet Take 2 tablets (10 mg total) by mouth daily. 03/21/16   Delfino Lovett, MD  isosorbide  mononitrate (IMDUR) 30 MG 24 hr tablet Take 1 tablet (30 mg total) by mouth daily. 03/22/16   Max Sane, MD  meclizine (ANTIVERT) 25 MG tablet Take 25 mg by mouth as needed.    [provider]  metFORMIN (GLUCOPHAGE) 500 MG tablet TAKE 2 TABLETS BY MOUTH EVERY MORNING, 1 TAB AT LUNCH AND 1 TAB AT SUPPER 06/24/16   Tower, [place] A, MD  Multiple Vitamin (MULTIVITAMIN) tablet Take 1 tablet by mouth daily.      [provider]  nitroGLYCERIN (NITROSTAT) 0.4 MG SL tablet Place 1 tablet (0.4 mg total) under the tongue every 5 (five) minutes as needed for chest pain. 03/21/16   Max Sane, MD  omeprazole (PRILOSEC) 20 MG capsule Take 20 mg by mouth daily.    [provider]  ONE TOUCH LANCETS MISC  Check blood sugar once daily and as directed. Dx E11.9 03/08/15   Tower, Wynelle Fanny, MD  ONETOUCH VERIO test strip CHECK BLOOD SUGAR ONCE DAILY AND AS DIRECTED. DX E11.9 09/09/16   Tower, Wynelle Fanny, MD  sacubitril-valsartan (ENTRESTO) 24-26 MG Take 1 tablet by mouth 2 (two) times daily. 03/21/16   Max Sane, MD  zolpidem (AMBIEN) 5 MG tablet Take 1 tablet (5 mg total) by mouth at bedtime as needed for sleep. 03/21/16   Max Sane, MD    Physical Exam   Triage Vital Signs: ED Triage Vitals  Enc Vitals Group     BP 10/01/21 0253 (!) 134/58     Pulse Rate 10/01/21 0253 72     Resp 10/01/21 0253 20     Temp 10/01/21 0253 98.3 F (36.8 C)     Temp src --      SpO2 10/01/21 0253 93 %     Weight 10/01/21 0254 150 lb (68 kg)     Height 10/01/21 0254 $RemoveBefor'5\' 1"'[id number]$  (1.549 m)     Head Circumference --      Peak Flow --      Pain Score 10/01/21 0254 7     Pain Loc --      Pain Edu? --      Excl. in [place]? --     Most recent vital signs: Vitals:   10/01/21 0500 10/01/21 0530  BP: 134/61 (!) 127/99  Pulse: 76 82  Resp:  18  Temp:    SpO2: 96% 96%    CONSTITUTIONAL: Alert and oriented and responds appropriately to questions. Well-appearing; well-nourished HEAD: Normocephalic, abrasions and soft tissue hematoma to the forehead EYES: Conjunctivae clear, pupils appear equal, sclera nonicteric ENT: normal nose; moist mucous membranes NECK: Supple, normal ROM CARD: RRR; S1 and S2 appreciated; no murmurs, no clicks, no rubs, no gallops RESP: Normal chest excursion without splinting or tachypnea; breath sounds clear and equal bilaterally; no wheezes, no rhonchi, no rales, no hypoxia or respiratory distress, speaking full sentences ABD/GI: Normal bowel sounds; non-distended; soft, non-tender, no rebound, no guarding, no peritoneal signs BACK: The back appears normal EXT: Normal ROM in all joints; no deformity noted, no edema; no cyanosis SKIN: Normal color for age and race; warm; no rash on exposed  skin NEURO: Moves all extremities equally, normal speech, normal sensation diffusely, no drift, no facial asymmetry PSYCH: The patient's mood and manner are appropriate.   ED Results / Procedures / Treatments   LABS: (all labs ordered are listed, but only abnormal results are displayed) Labs Reviewed  BASIC METABOLIC PANEL - Abnormal; Notable for the following components:  Result Value   Glucose, Bld 115 (*)    BUN 33 (*)    Creatinine, Ser 1.62 (*)    GFR, Estimated 31 (*)    All other components within normal limits  CBC - Abnormal; Notable for the following components:   RBC 3.74 (*)    Hemoglobin 11.0 (*)    HCT 34.6 (*)    All other components within normal limits  URINALYSIS, ROUTINE W REFLEX MICROSCOPIC  TROPONIN I (HIGH SENSITIVITY)  TROPONIN I (HIGH SENSITIVITY)     EKG:  EKG Interpretation  Date/Time:  Monday October 01 2021 02:58:57 EDT Ventricular Rate:  72 PR Interval:  154 QRS Duration: 126 QT Interval:  476 QTC Calculation: 521 R Axis:   -58 Text Interpretation: Normal sinus rhythm Left axis deviation Left bundle branch block No significant change since last tracing Confirmed by Rochele Raring [phone]) on 10/01/2021 3:22:54 AM         RADIOLOGY: My personal review and interpretation of imaging:    I have personally reviewed all radiology reports.   No results found.   PROCEDURES:  Critical Care performed: No      .1-3 Lead EKG Interpretation  Performed by: Sylvanna Burggraf, Layla Maw, DO Authorized by: Anastazia Creek, Layla Maw, DO     Interpretation: normal     ECG rate:  72   ECG rate assessment: normal     Rhythm: sinus rhythm     Ectopy: none     Conduction: normal       IMPRESSION / MDM / ASSESSMENT AND PLAN / ED COURSE  I reviewed the triage vital signs and the nursing notes.    Patient here after fall off the toilet.  She is not sure what caused her to fall.  Complaining of headache.  Has obvious forehead injury with abrasions and small  hematoma to the left side.  The patient is on the cardiac monitor to evaluate for evidence of arrhythmia and/or significant heart rate changes.   DIFFERENTIAL DIAGNOSIS (includes but not limited to):   Mechanical fall, orthostasis, dehydration, UTI, anemia, electrolyte derangement, ACS, arrhythmia, intracranial hemorrhage, skull fracture, concussion, contusion/hematoma   Patient's presentation is most consistent with acute presentation with potential threat to life or bodily function.   PLAN: We will obtain CBC, BMP, troponin, CT head and cervical spine.  EKG shows no ischemia or interval abnormalities compared to previous.  No arrhythmia.  She has a left bundle branch block which is stable.  Took Tylenol prior to arrival would like to see if this helps with her pain before taking anything else.  We will also check a urinalysis.  On exam patient does appear to have a bedbug infestation.  Family states they are aware and have had an exterminator come to the house and have tried multiple things to get rid of the bedbugs without relief.  I do not see any wounds from bedbug bites and no rash.   MEDICATIONS GIVEN IN ED: Medications  HYDROcodone-acetaminophen (NORCO/VICODIN) 5-325 MG per tablet 1 tablet (1 tablet Oral Patient Refused/Not Given 10/01/21 0549)     ED COURSE: Patient's labs show no significant abnormality.  Chronic kidney disease which is stable.  Normal electrolytes, hemoglobin, troponin.  Patient awaiting urinalysis and CT of the head and cervical spine.  Due to patient being completely infested with bedbugs, she would need decontamination prior to going to CT scan.  Patient became irritated because of the wait time for this and wanted to leave against medical.  Patient alert and oriented, not intoxicated and appears to have capacity to make decisions for herself.  Husband is at the bedside.  I was in with another emergent patient and Dr. Su Hoff spoke to the patient and family  and patient has decided to leave [place] despite [place] trying to persuade her to stay for imaging, urinalysis.  Risk of intracranial hemorrhage, skull fracture was discussed with patient and her family by my colleague and nursing staff.   I reviewed all nursing notes and pertinent previous records as available.  I have reviewed and interpreted any and all EKGs, lab and urine results, imaging and radiology reports (as available).   CONSULTS: Patient left the hospital [place].  Admission was considered but full work-up not complete.   OUTSIDE RECORDS REVIEWED: Reviewed patient's last office visit with Sarajane Jews on 08/31/2021.  Patient seen then for a right thumb fracture after a fall getting out of bed.       FINAL CLINICAL IMPRESSION(S) / ED DIAGNOSES   Final diagnoses:  Fall, initial encounter  Injury of head, initial encounter  Infestation by bed bug     Rx / DC Orders   ED Discharge Orders     None        Note:  This document was prepared using Dragon voice recognition software and may include unintentional dictation errors.   Bernarda Erck, Delice Bison, DO 10/01/21 [phone]

## 2021-10-19 DIAGNOSIS — I2510 Atherosclerotic heart disease of native coronary artery without angina pectoris: Secondary | ICD-10-CM | POA: Diagnosis not present

## 2021-10-19 DIAGNOSIS — I10 Essential (primary) hypertension: Secondary | ICD-10-CM | POA: Diagnosis not present

## 2021-10-19 DIAGNOSIS — E785 Hyperlipidemia, unspecified: Secondary | ICD-10-CM | POA: Diagnosis not present

## 2021-11-19 DIAGNOSIS — E785 Hyperlipidemia, unspecified: Secondary | ICD-10-CM | POA: Diagnosis not present

## 2021-11-19 DIAGNOSIS — I10 Essential (primary) hypertension: Secondary | ICD-10-CM | POA: Diagnosis not present

## 2021-11-19 DIAGNOSIS — I2510 Atherosclerotic heart disease of native coronary artery without angina pectoris: Secondary | ICD-10-CM | POA: Diagnosis not present

## 2021-11-30 DIAGNOSIS — E785 Hyperlipidemia, unspecified: Secondary | ICD-10-CM | POA: Diagnosis not present

## 2021-11-30 DIAGNOSIS — D509 Iron deficiency anemia, unspecified: Secondary | ICD-10-CM | POA: Diagnosis not present

## 2021-11-30 DIAGNOSIS — I509 Heart failure, unspecified: Secondary | ICD-10-CM | POA: Diagnosis not present

## 2021-11-30 DIAGNOSIS — E1122 Type 2 diabetes mellitus with diabetic chronic kidney disease: Secondary | ICD-10-CM | POA: Diagnosis not present

## 2021-11-30 DIAGNOSIS — I10 Essential (primary) hypertension: Secondary | ICD-10-CM | POA: Diagnosis not present

## 2021-12-04 DIAGNOSIS — D509 Iron deficiency anemia, unspecified: Secondary | ICD-10-CM | POA: Diagnosis not present

## 2021-12-04 DIAGNOSIS — E785 Hyperlipidemia, unspecified: Secondary | ICD-10-CM | POA: Diagnosis not present

## 2021-12-04 DIAGNOSIS — E1122 Type 2 diabetes mellitus with diabetic chronic kidney disease: Secondary | ICD-10-CM | POA: Diagnosis not present

## 2021-12-04 DIAGNOSIS — I10 Essential (primary) hypertension: Secondary | ICD-10-CM | POA: Diagnosis not present

## 2021-12-20 DIAGNOSIS — E785 Hyperlipidemia, unspecified: Secondary | ICD-10-CM | POA: Diagnosis not present

## 2021-12-20 DIAGNOSIS — I2510 Atherosclerotic heart disease of native coronary artery without angina pectoris: Secondary | ICD-10-CM | POA: Diagnosis not present

## 2021-12-20 DIAGNOSIS — I10 Essential (primary) hypertension: Secondary | ICD-10-CM | POA: Diagnosis not present

## 2022-01-20 DEATH — deceased

## 2022-03-01 DIAGNOSIS — Z23 Encounter for immunization: Secondary | ICD-10-CM | POA: Diagnosis not present

## 2022-03-01 DIAGNOSIS — E1122 Type 2 diabetes mellitus with diabetic chronic kidney disease: Secondary | ICD-10-CM | POA: Diagnosis not present

## 2022-03-01 DIAGNOSIS — D513 Other dietary vitamin B12 deficiency anemia: Secondary | ICD-10-CM | POA: Diagnosis not present

## 2022-03-01 DIAGNOSIS — G3184 Mild cognitive impairment, so stated: Secondary | ICD-10-CM | POA: Diagnosis not present

## 2022-03-01 DIAGNOSIS — I10 Essential (primary) hypertension: Secondary | ICD-10-CM | POA: Diagnosis not present

## 2022-03-01 DIAGNOSIS — I2510 Atherosclerotic heart disease of native coronary artery without angina pectoris: Secondary | ICD-10-CM | POA: Diagnosis not present

## 2022-03-01 DIAGNOSIS — E785 Hyperlipidemia, unspecified: Secondary | ICD-10-CM | POA: Diagnosis not present

## 2022-03-09 ENCOUNTER — Other Ambulatory Visit: Payer: Self-pay

## 2022-03-09 ENCOUNTER — Emergency Department: Payer: Medicare HMO

## 2022-03-09 ENCOUNTER — Emergency Department
Admission: EM | Admit: 2022-03-09 | Discharge: 2022-03-09 | Disposition: A | Discharge status: Home / Self Care | Payer: Medicare HMO | Attending: Emergency Medicine | Admitting: Emergency Medicine

## 2022-03-09 DIAGNOSIS — R109 Unspecified abdominal pain: Secondary | ICD-10-CM | POA: Diagnosis not present

## 2022-03-09 DIAGNOSIS — R1084 Generalized abdominal pain: Secondary | ICD-10-CM | POA: Insufficient documentation

## 2022-03-09 DIAGNOSIS — R110 Nausea: Secondary | ICD-10-CM | POA: Diagnosis not present

## 2022-03-09 DIAGNOSIS — R000 Tachycardia, unspecified: Secondary | ICD-10-CM | POA: Diagnosis not present

## 2022-03-09 DIAGNOSIS — K5900 Constipation, unspecified: Secondary | ICD-10-CM | POA: Diagnosis not present

## 2022-03-09 DIAGNOSIS — R1011 Right upper quadrant pain: Secondary | ICD-10-CM | POA: Diagnosis not present

## 2022-03-09 DIAGNOSIS — R1111 Vomiting without nausea: Secondary | ICD-10-CM | POA: Diagnosis not present

## 2022-03-09 DIAGNOSIS — R630 Anorexia: Secondary | ICD-10-CM | POA: Diagnosis not present

## 2022-03-09 DIAGNOSIS — R112 Nausea with vomiting, unspecified: Secondary | ICD-10-CM | POA: Diagnosis not present

## 2022-03-09 LAB — COMPREHENSIVE METABOLIC PANEL
ALT: 22 U/L (ref 0–44)
AST: 25 U/L (ref 15–41)
Albumin: 3.9 g/dL (ref 3.5–5.0)
Alkaline Phosphatase: 57 U/L (ref 38–126)
Anion gap: 7 (ref 5–15)
BUN: 19 mg/dL (ref 8–23)
CO2: 25 mmol/L (ref 22–32)
Calcium: 10 mg/dL (ref 8.9–10.3)
Chloride: 107 mmol/L (ref 98–111)
Creatinine, Ser: 1.3 mg/dL — ABNORMAL HIGH (ref 0.44–1.00)
GFR, Estimated: 41 mL/min — ABNORMAL LOW (ref >60)
Glucose, Bld: 144 mg/dL — ABNORMAL HIGH (ref 70–99)
Potassium: 4.1 mmol/L (ref 3.5–5.1)
Sodium: 139 mmol/L (ref 135–145)
Total Bilirubin: 0.8 mg/dL (ref 0.3–1.2)
Total Protein: 7.6 g/dL (ref 6.5–8.1)

## 2022-03-09 LAB — LIPASE, BLOOD: Lipase: 32 U/L (ref 11–51)

## 2022-03-09 LAB — CBC
HCT: 35.6 % — ABNORMAL LOW (ref 36.0–46.0)
Hemoglobin: 12 g/dL (ref 12.0–15.0)
MCH: 29.4 pg (ref 26.0–34.0)
MCHC: 33.7 g/dL (ref 30.0–36.0)
MCV: 87.3 fL (ref 80.0–100.0)
Platelets: 248 10*3/uL (ref 150–400)
RBC: 4.08 MIL/uL (ref 3.87–5.11)
RDW: 13.9 % (ref 11.5–15.5)
WBC: 8.8 10*3/uL (ref 4.0–10.5)
nRBC: 0 % (ref 0.0–0.2)

## 2022-03-09 LAB — URINALYSIS, ROUTINE W REFLEX MICROSCOPIC
Bacteria, UA: NONE SEEN
Bilirubin Urine: NEGATIVE
Glucose, UA: 50 mg/dL — AB
Hgb urine dipstick: NEGATIVE
Ketones, ur: NEGATIVE mg/dL
Leukocytes,Ua: NEGATIVE
Nitrite: NEGATIVE
Protein, ur: 30 mg/dL — AB
Specific Gravity, Urine: 1.024 (ref 1.005–1.030)
pH: 5 (ref 5.0–8.0)

## 2022-03-09 MED ORDER — IOHEXOL 300 MG/ML  SOLN
100.0000 mL | Freq: Once | INTRAMUSCULAR | Status: AC | PRN
  Administered 2022-03-09: 100 mL via INTRAVENOUS

## 2022-03-09 MED ORDER — ONDANSETRON HCL 4 MG PO TABS: 4.0000 mg | ORAL_TABLET | Freq: Three times a day (TID) | ORAL | 0 refills | Status: DC | PRN

## 2022-03-09 NOTE — ED Notes (Signed)
Assisted pt to toilet, one assist, urine sample obtained. Pt gait unsteady at this time.  Primary RN Feliz Beam notified.

## 2022-03-09 NOTE — ED Provider Notes (Signed)
[facility] Provider Note    Event Date/Time   First MD Initiated Contact with Patient 03/09/22 1801     (approximate)   History   Abdominal Pain and Constipation   HPI  [name] is a 86 y.o. female who presents to the emergency department today because of concerns for abdominal pain and constipation.  The patient last had a bowel movement 4 days ago.  Normally she has a bowel movement every day.  For the past 3 days she has also been having some nausea and decreased appetite.  Today the patient was complaining of abdominal pain.  It did come and go.  At the time my exam she is not complaining of any abdominal pain.     Physical Exam   Triage Vital Signs: ED Triage Vitals  Enc Vitals Group     BP 03/09/22 1424 117/72     Pulse Rate 03/09/22 1424 77     Resp 03/09/22 1424 20     Temp 03/09/22 1425 98.1 F (36.7 C)     Temp Source 03/09/22 1425 Oral     SpO2 03/09/22 1424 96 %     Weight 03/09/22 1424 152 lb 1.9 oz (69 kg)     Height 03/09/22 1424 5\' 1"  (1.549 m)     Head Circumference --      Peak Flow --      Pain Score 03/09/22 1424 9     Pain Loc --      Pain Edu? --      Excl. in [place]? --     Most recent vital signs: Vitals:   03/09/22 1753 03/09/22 1754  BP: (!) 148/58   Pulse: 71 71  Resp: 20   Temp:    SpO2: 98% 98%    General: Awake, alert, not completely oriented. CV:  Good peripheral perfusion. Regular rate and rhythm. Resp:  Normal effort. Lungs clear. Abd:  No distention. Minimally tender in the LUQ and RLQ.   ED Results / Procedures / Treatments   Labs (all labs ordered are listed, but only abnormal results are displayed) Labs Reviewed  COMPREHENSIVE METABOLIC PANEL - Abnormal; Notable for the following components:      Result Value   Glucose, Bld 144 (*)    Creatinine, Ser 1.30 (*)    GFR, Estimated 41 (*)    All other components within normal limits  CBC - Abnormal; Notable for the following components:    HCT 35.6 (*)    All other components within normal limits  LIPASE, BLOOD  URINALYSIS, ROUTINE W REFLEX MICROSCOPIC     EKG  None   RADIOLOGY I independently interpreted and visualized the abd x-ray. My interpretation: No abnormal gas pattern Radiology interpretation:  IMPRESSION:  No cause for the patient's symptoms identified. No abnormal fecal  loading in the colon.   I independently interpreted and visualized the CT abd/pel. My interpretation: No free air. No large fluid collection. Radiology interpretation:  IMPRESSION:  1. No acute localizing process in the abdomen or pelvis.  2. 2.4 cm left ovarian simple-appearing cyst. Recommend prompt  follow-up with pelvic 03/11/22.    Aortic Atherosclerosis (ICD10-I70.0).     PROCEDURES:  Critical Care performed: No  Procedures   MEDICATIONS ORDERED IN ED: Medications - No data to display   IMPRESSION / MDM / ASSESSMENT AND PLAN / ED COURSE  I reviewed the triage vital signs and the nursing notes.  Differential diagnosis includes, but is not limited to, constipation, obstruction, volvulus, gastritis.  Patient's presentation is most consistent with acute presentation with potential threat to life or bodily function.  Patient presented to the emergency department today because of concerns for abdominal pain, constipation and nausea.  On exam patient had some minimal tenderness to her abdomen.  Blood work without concerning leukocytosis.  X-ray was obtained from triage without any concerning findings.  Did get a CT scan.  This did not show any concerning obstruction, volvulus or infection.  Additionally did not show any significant stool burden.  This point I wonder if patient possibly has a viral illness causing some nausea and abdominal pain.  Additionally family thinks it could be due to a new medication she is on.  We will plan on treating with antiemetic.  Discussed return precautions with  patient and husband.   FINAL CLINICAL IMPRESSION(S) / ED DIAGNOSES   Final diagnoses:  Generalized abdominal pain  Nausea     Note:  This document was prepared using Dragon voice recognition software and may include unintentional dictation errors.    Phineas Semen, MD 03/09/22 2145

## 2022-03-09 NOTE — ED Triage Notes (Signed)
Pt reports has not been able to move her bowels in 4 days. Pt reports now has abd pain some distention and feels nauseated.

## 2022-03-09 NOTE — Discharge Instructions (Signed)
Please seek medical attention for any high fevers, chest pain, shortness of breath, change in behavior, persistent vomiting, bloody stool or any other new or concerning symptoms.  

## 2022-03-09 NOTE — ED Triage Notes (Signed)
Pt in via EMS from home with c/o constipation for 3 days and is now nauseated and vomiting. Pt usually goes every day. 115 HR, 125/90, 98.1 temp

## 2022-03-12 ENCOUNTER — Telehealth: Payer: Self-pay | Admitting: *Deleted

## 2022-03-12 NOTE — Telephone Encounter (Signed)
     Patient  visit on 03/09/2022  at [facility]  ed was for belly pain   Have you been able to follow up with your primary care physician? Patient is doing better , was able to get all medication , is still going to see pcp after the holiday The patient was or was not able to obtain any needed medicine or equipment.  Are there diet recommendations that you are having difficulty following?  Patient expresses understanding of discharge instructions and education provided has no other needs at this time.   Yehuda Mao Greenauer -[facility], Population Health [phone] [address] [place] , [place] [address] Email : [email or web address]

## 2022-03-21 DIAGNOSIS — E785 Hyperlipidemia, unspecified: Secondary | ICD-10-CM | POA: Diagnosis not present

## 2022-03-21 DIAGNOSIS — I10 Essential (primary) hypertension: Secondary | ICD-10-CM | POA: Diagnosis not present

## 2022-04-11 DIAGNOSIS — I10 Essential (primary) hypertension: Secondary | ICD-10-CM | POA: Diagnosis not present

## 2022-04-11 DIAGNOSIS — E785 Hyperlipidemia, unspecified: Secondary | ICD-10-CM | POA: Diagnosis not present

## 2022-04-11 DIAGNOSIS — I509 Heart failure, unspecified: Secondary | ICD-10-CM | POA: Diagnosis not present

## 2022-04-11 DIAGNOSIS — E1122 Type 2 diabetes mellitus with diabetic chronic kidney disease: Secondary | ICD-10-CM | POA: Diagnosis not present

## 2022-04-12 ENCOUNTER — Other Ambulatory Visit: Payer: Self-pay | Admitting: Internal Medicine

## 2022-04-12 DIAGNOSIS — Z1231 Encounter for screening mammogram for malignant neoplasm of breast: Secondary | ICD-10-CM

## 2022-04-20 DIAGNOSIS — I10 Essential (primary) hypertension: Secondary | ICD-10-CM | POA: Diagnosis not present

## 2022-04-20 DIAGNOSIS — E785 Hyperlipidemia, unspecified: Secondary | ICD-10-CM | POA: Diagnosis not present

## 2022-06-01 ENCOUNTER — Other Ambulatory Visit: Payer: Self-pay | Admitting: Internal Medicine

## 2022-06-01 DIAGNOSIS — I420 Dilated cardiomyopathy: Secondary | ICD-10-CM

## 2022-06-05 ENCOUNTER — Other Ambulatory Visit: Payer: Self-pay

## 2022-06-05 ENCOUNTER — Observation Stay
Admission: EM | Admit: 2022-06-05 | Discharge: 2022-06-08 | Disposition: A | Discharge status: Skilled Nursing Facility | Payer: Medicare HMO | Attending: Family Medicine | Admitting: Family Medicine

## 2022-06-05 ENCOUNTER — Emergency Department: Payer: Medicare HMO

## 2022-06-05 ENCOUNTER — Telehealth: Payer: Self-pay

## 2022-06-05 DIAGNOSIS — Z79899 Other long term (current) drug therapy: Secondary | ICD-10-CM | POA: Diagnosis not present

## 2022-06-05 DIAGNOSIS — R4781 Slurred speech: Secondary | ICD-10-CM | POA: Diagnosis not present

## 2022-06-05 DIAGNOSIS — E161 Other hypoglycemia: Secondary | ICD-10-CM | POA: Diagnosis not present

## 2022-06-05 DIAGNOSIS — E785 Hyperlipidemia, unspecified: Secondary | ICD-10-CM | POA: Diagnosis present

## 2022-06-05 DIAGNOSIS — R443 Hallucinations, unspecified: Secondary | ICD-10-CM | POA: Insufficient documentation

## 2022-06-05 DIAGNOSIS — E162 Hypoglycemia, unspecified: Secondary | ICD-10-CM | POA: Diagnosis not present

## 2022-06-05 DIAGNOSIS — I5022 Chronic systolic (congestive) heart failure: Secondary | ICD-10-CM | POA: Diagnosis not present

## 2022-06-05 DIAGNOSIS — N189 Chronic kidney disease, unspecified: Secondary | ICD-10-CM | POA: Insufficient documentation

## 2022-06-05 DIAGNOSIS — R4182 Altered mental status, unspecified: Secondary | ICD-10-CM | POA: Diagnosis not present

## 2022-06-05 DIAGNOSIS — I130 Hypertensive heart and chronic kidney disease with heart failure and stage 1 through stage 4 chronic kidney disease, or unspecified chronic kidney disease: Secondary | ICD-10-CM | POA: Insufficient documentation

## 2022-06-05 DIAGNOSIS — G9341 Metabolic encephalopathy: Secondary | ICD-10-CM | POA: Insufficient documentation

## 2022-06-05 DIAGNOSIS — Z66 Do not resuscitate: Secondary | ICD-10-CM | POA: Diagnosis present

## 2022-06-05 DIAGNOSIS — Z7982 Long term (current) use of aspirin: Secondary | ICD-10-CM | POA: Diagnosis not present

## 2022-06-05 DIAGNOSIS — J45909 Unspecified asthma, uncomplicated: Secondary | ICD-10-CM | POA: Diagnosis not present

## 2022-06-05 DIAGNOSIS — Z7984 Long term (current) use of oral hypoglycemic drugs: Secondary | ICD-10-CM | POA: Diagnosis not present

## 2022-06-05 DIAGNOSIS — E1159 Type 2 diabetes mellitus with other circulatory complications: Secondary | ICD-10-CM | POA: Diagnosis present

## 2022-06-05 DIAGNOSIS — I152 Hypertension secondary to endocrine disorders: Secondary | ICD-10-CM | POA: Diagnosis present

## 2022-06-05 DIAGNOSIS — E11649 Type 2 diabetes mellitus with hypoglycemia without coma: Principal | ICD-10-CM | POA: Diagnosis present

## 2022-06-05 DIAGNOSIS — I10 Essential (primary) hypertension: Secondary | ICD-10-CM | POA: Diagnosis present

## 2022-06-05 DIAGNOSIS — F03918 Unspecified dementia, unspecified severity, with other behavioral disturbance: Secondary | ICD-10-CM | POA: Diagnosis not present

## 2022-06-05 DIAGNOSIS — R29810 Facial weakness: Secondary | ICD-10-CM | POA: Diagnosis not present

## 2022-06-05 LAB — CBC WITH DIFFERENTIAL/PLATELET
Abs Immature Granulocytes: 0.02 10*3/uL (ref 0.00–0.07)
Basophils Absolute: 0 10*3/uL (ref 0.0–0.1)
Basophils Relative: 1 %
Eosinophils Absolute: 0.4 10*3/uL (ref 0.0–0.5)
Eosinophils Relative: 5 %
HCT: 34.9 % — ABNORMAL LOW (ref 36.0–46.0)
Hemoglobin: 11.1 g/dL — ABNORMAL LOW (ref 12.0–15.0)
Immature Granulocytes: 0 %
Lymphocytes Relative: 30 %
Lymphs Abs: 2.5 10*3/uL (ref 0.7–4.0)
MCH: 28.9 pg (ref 26.0–34.0)
MCHC: 31.8 g/dL (ref 30.0–36.0)
MCV: 90.9 fL (ref 80.0–100.0)
Monocytes Absolute: 0.5 10*3/uL (ref 0.1–1.0)
Monocytes Relative: 5 %
Neutro Abs: 5 10*3/uL (ref 1.7–7.7)
Neutrophils Relative %: 59 %
Platelets: 217 10*3/uL (ref 150–400)
RBC: 3.84 MIL/uL — ABNORMAL LOW (ref 3.87–5.11)
RDW: 15.1 % (ref 11.5–15.5)
WBC: 8.3 10*3/uL (ref 4.0–10.5)
nRBC: 0 % (ref 0.0–0.2)

## 2022-06-05 LAB — COMPREHENSIVE METABOLIC PANEL
ALT: 24 U/L (ref 0–44)
AST: 32 U/L (ref 15–41)
Albumin: 3.7 g/dL (ref 3.5–5.0)
Alkaline Phosphatase: 45 U/L (ref 38–126)
Anion gap: 10 (ref 5–15)
BUN: 22 mg/dL (ref 8–23)
CO2: 24 mmol/L (ref 22–32)
Calcium: 9.7 mg/dL (ref 8.9–10.3)
Chloride: 104 mmol/L (ref 98–111)
Creatinine, Ser: 1.26 mg/dL — ABNORMAL HIGH (ref 0.44–1.00)
GFR, Estimated: 42 mL/min — ABNORMAL LOW (ref >60)
Glucose, Bld: 85 mg/dL (ref 70–99)
Potassium: 3.6 mmol/L (ref 3.5–5.1)
Sodium: 138 mmol/L (ref 135–145)
Total Bilirubin: 0.6 mg/dL (ref 0.3–1.2)
Total Protein: 7.7 g/dL (ref 6.5–8.1)

## 2022-06-05 LAB — CBG MONITORING, ED
Glucose-Capillary: 94 mg/dL (ref 70–99)
Glucose-Capillary: 59 mg/dL — ABNORMAL LOW (ref 70–99)
Glucose-Capillary: 98 mg/dL (ref 70–99)
Glucose-Capillary: 162 mg/dL — ABNORMAL HIGH (ref 70–99)
Glucose-Capillary: 199 mg/dL — ABNORMAL HIGH (ref 70–99)
Glucose-Capillary: 196 mg/dL — ABNORMAL HIGH (ref 70–99)

## 2022-06-05 LAB — GLUCOSE, CAPILLARY
Glucose-Capillary: 173 mg/dL — ABNORMAL HIGH (ref 70–99)
Glucose-Capillary: 179 mg/dL — ABNORMAL HIGH (ref 70–99)

## 2022-06-05 LAB — URINALYSIS, ROUTINE W REFLEX MICROSCOPIC
Bilirubin Urine: NEGATIVE
Glucose, UA: NEGATIVE mg/dL
Hgb urine dipstick: NEGATIVE
Ketones, ur: NEGATIVE mg/dL
Leukocytes,Ua: NEGATIVE
Nitrite: NEGATIVE
Protein, ur: NEGATIVE mg/dL
Specific Gravity, Urine: 1.008 (ref 1.005–1.030)
pH: 5 (ref 5.0–8.0)

## 2022-06-05 LAB — HEMOGLOBIN A1C
Hgb A1c MFr Bld: 6.1 % — ABNORMAL HIGH (ref 4.8–5.6)
Mean Plasma Glucose: 128.37 mg/dL

## 2022-06-05 MED ORDER — ONDANSETRON HCL 4 MG/2ML IJ SOLN: 4.0000 mg | Freq: Four times a day (QID) | INTRAMUSCULAR | Status: DC | PRN

## 2022-06-05 MED ORDER — ENOXAPARIN SODIUM 30 MG/0.3ML IJ SOSY
30.0000 mg | PREFILLED_SYRINGE | INTRAMUSCULAR | Status: DC
  Administered 2022-06-05: 30 mg via SUBCUTANEOUS
  Filled 2022-06-05: qty 0.3

## 2022-06-05 MED ORDER — DOCUSATE SODIUM 100 MG PO CAPS
100.0000 mg | ORAL_CAPSULE | Freq: Two times a day (BID) | ORAL | Status: DC
  Administered 2022-06-05 – 2022-06-06 (×2): 100 mg via ORAL
  Filled 2022-06-05 (×2): qty 1

## 2022-06-05 MED ORDER — HYDRALAZINE HCL 20 MG/ML IJ SOLN: 5.0000 mg | INTRAMUSCULAR | Status: DC | PRN

## 2022-06-05 MED ORDER — ROSUVASTATIN CALCIUM 20 MG PO TABS
40.0000 mg | ORAL_TABLET | Freq: Every day | ORAL | Status: DC
  Administered 2022-06-06 – 2022-06-08 (×3): 40 mg via ORAL
  Filled 2022-06-05 (×3): qty 2

## 2022-06-05 MED ORDER — DEXTROSE 50 % IV SOLN: 1.0000 | Freq: Once | INTRAVENOUS | Status: DC

## 2022-06-05 MED ORDER — POLYETHYLENE GLYCOL 3350 17 G PO PACK: 17.0000 g | PACK | Freq: Every day | ORAL | Status: DC | PRN

## 2022-06-05 MED ORDER — GABAPENTIN 100 MG PO CAPS
100.0000 mg | ORAL_CAPSULE | Freq: Every day | ORAL | Status: DC
  Administered 2022-06-05 – 2022-06-07 (×3): 100 mg via ORAL
  Filled 2022-06-05 (×3): qty 1

## 2022-06-05 MED ORDER — INSULIN ASPART 100 UNIT/ML IJ SOLN
0.0000 [IU] | Freq: Three times a day (TID) | INTRAMUSCULAR | Status: DC
  Administered 2022-06-05 – 2022-06-06 (×5): 2 [IU] via SUBCUTANEOUS
  Administered 2022-06-06: 1 [IU] via SUBCUTANEOUS
  Administered 2022-06-07 (×2): 2 [IU] via SUBCUTANEOUS
  Administered 2022-06-07: 3 [IU] via SUBCUTANEOUS
  Administered 2022-06-08: 2 [IU] via SUBCUTANEOUS
  Filled 2022-06-05 (×10): qty 1

## 2022-06-05 MED ORDER — CARVEDILOL 6.25 MG PO TABS
12.5000 mg | ORAL_TABLET | Freq: Two times a day (BID) | ORAL | Status: DC
  Administered 2022-06-05 – 2022-06-08 (×6): 12.5 mg via ORAL
  Filled 2022-06-05 (×6): qty 2

## 2022-06-05 MED ORDER — BISACODYL 5 MG PO TBEC: 5.0000 mg | DELAYED_RELEASE_TABLET | Freq: Every day | ORAL | Status: DC | PRN

## 2022-06-05 MED ORDER — ONDANSETRON HCL 4 MG PO TABS: 4.0000 mg | ORAL_TABLET | Freq: Four times a day (QID) | ORAL | Status: DC | PRN

## 2022-06-05 MED ORDER — SACUBITRIL-VALSARTAN 24-26 MG PO TABS
0.5000 | ORAL_TABLET | Freq: Two times a day (BID) | ORAL | Status: DC
  Administered 2022-06-05 – 2022-06-08 (×6): 0.5 via ORAL
  Filled 2022-06-05 (×6): qty 0.5

## 2022-06-05 NOTE — ED Notes (Signed)
ED TO INPATIENT HANDOFF REPORT  ED Nurse Name and Phone #: Baxter Flattery, RN  S Name/Age/Gender [name] 87 y.o. female Room/Bed: ED12A/ED12A  Code Status   Code Status: DNR  Home/SNF/Other Home Patient oriented to: self, place, and time Is this baseline? Yes   Triage Complete: Triage complete  Chief Complaint Hypoglycemia [E16.2]  Triage Note Patient arrived via EMS. Patient with altered mental status, left sided facial droop and not speaking on EMS arrival. Initial CBG 62. 20 G left FA and 125 ml D10 given. EMS report patient became alert and oriented, speaking in full clear sentences and no longer with facial droop. Repeat CBG 195. On hospital arrival, patient alert, oriented to name, place, and age but unable to state year or situation. Resp even, unlabored on RA.    Allergies Allergies  Allergen Reactions   Acetaminophen     REACTION: nausea   Codeine     REACTION: itching   Fluticasone Propionate     REACTION: palpitations   Fosamax [Alendronate Sodium]     Difficulty swallowing and fever/ joint pain    Lidocaine Nausea And Vomiting    Increases heart rate, N/V per pt   Naproxen     REACTION: GI   Nsaids     REACTION: elevated LFT's   Penicillins     REACTION: swelling at inj site    Level of Care/Admitting Diagnosis ED Disposition     ED Disposition  Admit   Condition  --   [place]: [place] [100120]  Level of Care: Med-Surg [16]  Covid Evaluation: Asymptomatic - no recent exposure (last 10 days) testing not required  Diagnosis: Hypoglycemia [id number]  Admitting Physician: Christel Mormon [id number]  Attending Physician: Christel Mormon [id number]          B Medical/Surgery History Past Medical History:  Diagnosis Date   Asthma    childhood   Depression    Diabetes mellitus    Type II   Foot fracture, right 3/13   H/O scoliosis    HLD (hyperlipidemia)    HTN (hypertension)    Osteoarthritis     hands,elbow,knees   Osteoporosis    Rhinitis, allergic    Scoliosis    chronic back pain   Past Surgical History:  Procedure Laterality Date   bladder tack     CARDIAC CATHETERIZATION Right 03/19/2016   Procedure: Right/Left Heart Cath and Coronary Angiography;  Surgeon: Dionisio David, MD;  Location: [facility];  Service: Cardiovascular;  Laterality: Right;   TUBAL LIGATION     bilateral     A IV Location/Drains/Wounds Patient Lines/Drains/Airways Status     Active Line/Drains/Airways     Name Placement date Placement time Site Days   Peripheral IV 06/05/22 20 G Anterior;Left Forearm 06/05/22  0456  Forearm  less than 1            Intake/Output Last 24 hours No intake or output data in the 24 hours ending 06/05/22 1726  Labs/Imaging Results for orders placed or performed during the hospital encounter of 06/05/22 (from the past 48 hour(s))  CBG monitoring, ED     Status: None   Collection Time: 06/05/22  4:53 AM  Result Value Ref Range   Glucose-Capillary 94 70 - 99 mg/dL    Comment: Glucose reference range applies only to samples taken after fasting for at least 8 hours.  CBC with Differential/Platelet     Status: Abnormal  Collection Time: 06/05/22  5:00 AM  Result Value Ref Range   WBC 8.3 4.0 - 10.5 K/uL   RBC 3.84 (L) 3.87 - 5.11 MIL/uL   Hemoglobin 11.1 (L) 12.0 - 15.0 g/dL   HCT 34.9 (L) 36.0 - 46.0 %   MCV 90.9 80.0 - 100.0 fL   MCH 28.9 26.0 - 34.0 pg   MCHC 31.8 30.0 - 36.0 g/dL   RDW 15.1 11.5 - 15.5 %   Platelets 217 150 - 400 K/uL   nRBC 0.0 0.0 - 0.2 %   Neutrophils Relative % 59 %   Neutro Abs 5.0 1.7 - 7.7 K/uL   Lymphocytes Relative 30 %   Lymphs Abs 2.5 0.7 - 4.0 K/uL   Monocytes Relative 5 %   Monocytes Absolute 0.5 0.1 - 1.0 K/uL   Eosinophils Relative 5 %   Eosinophils Absolute 0.4 0.0 - 0.5 K/uL   Basophils Relative 1 %   Basophils Absolute 0.0 0.0 - 0.1 K/uL   Immature Granulocytes 0 %   Abs Immature Granulocytes 0.02  0.00 - 0.07 K/uL    Comment: Performed at [facility], [address] [place], [place] [address]  Comprehensive metabolic panel     Status: Abnormal   Collection Time: 06/05/22  5:00 AM  Result Value Ref Range   Sodium 138 135 - 145 mmol/L   Potassium 3.6 3.5 - 5.1 mmol/L   Chloride 104 98 - 111 mmol/L   CO2 24 22 - 32 mmol/L   Glucose, Bld 85 70 - 99 mg/dL    Comment: Glucose reference range applies only to samples taken after fasting for at least 8 hours.   BUN 22 8 - 23 mg/dL   Creatinine, Ser 1.26 (H) 0.44 - 1.00 mg/dL   Calcium 9.7 8.9 - 10.3 mg/dL   Total Protein 7.7 6.5 - 8.1 g/dL   Albumin 3.7 3.5 - 5.0 g/dL   AST 32 15 - 41 U/L   ALT 24 0 - 44 U/L   Alkaline Phosphatase 45 38 - 126 U/L   Total Bilirubin 0.6 0.3 - 1.2 mg/dL   GFR, Estimated 42 (L) >60 mL/min    Comment: (NOTE) Calculated using the CKD-EPI Creatinine Equation (2021)    Anion gap 10 5 - 15    Comment: Performed at [facility], [address] [place], [place] [address]  Urinalysis, Routine w reflex microscopic -Urine, Catheterized     Status: Abnormal   Collection Time: 06/05/22  5:00 AM  Result Value Ref Range   Color, Urine STRAW (A) YELLOW   APPearance CLEAR (A) CLEAR   Specific Gravity, Urine 1.008 1.005 - 1.030   pH 5.0 5.0 - 8.0   Glucose, UA NEGATIVE NEGATIVE mg/dL   Hgb urine dipstick NEGATIVE NEGATIVE   Bilirubin Urine NEGATIVE NEGATIVE   Ketones, ur NEGATIVE NEGATIVE mg/dL   Protein, ur NEGATIVE NEGATIVE mg/dL   Nitrite NEGATIVE NEGATIVE   Leukocytes,Ua NEGATIVE NEGATIVE    Comment: Performed at [facility], [address] [place], [place] [address]  Hemoglobin A1c     Status: Abnormal   Collection Time: 06/05/22  5:00 AM  Result Value Ref Range   Hgb A1c MFr Bld 6.1 (H) 4.8 - 5.6 %    Comment: (NOTE) Pre diabetes:          5.7%-6.4%  Diabetes:              >6.4%  Glycemic control for   <7.0% adults with  diabetes    Mean Plasma Glucose 128.37  mg/dL    Comment: Performed at [facility] [address], [place], [place] [address]  CBG monitoring, ED     Status: Abnormal   Collection Time: 06/05/22  6:06 AM  Result Value Ref Range   Glucose-Capillary 59 (L) 70 - 99 mg/dL    Comment: Glucose reference range applies only to samples taken after fasting for at least 8 hours.  CBG monitoring, ED     Status: None   Collection Time: 06/05/22  7:11 AM  Result Value Ref Range   Glucose-Capillary 98 70 - 99 mg/dL    Comment: Glucose reference range applies only to samples taken after fasting for at least 8 hours.  CBG monitoring, ED     Status: Abnormal   Collection Time: 06/05/22  8:55 AM  Result Value Ref Range   Glucose-Capillary 162 (H) 70 - 99 mg/dL    Comment: Glucose reference range applies only to samples taken after fasting for at least 8 hours.  CBG monitoring, ED     Status: Abnormal   Collection Time: 06/05/22 11:25 AM  Result Value Ref Range   Glucose-Capillary 199 (H) 70 - 99 mg/dL    Comment: Glucose reference range applies only to samples taken after fasting for at least 8 hours.  CBG monitoring, ED     Status: Abnormal   Collection Time: 06/05/22  4:04 PM  Result Value Ref Range   Glucose-Capillary 196 (H) 70 - 99 mg/dL    Comment: Glucose reference range applies only to samples taken after fasting for at least 8 hours.   CT HEAD WO CONTRAST (5MM)  Result Date: 06/05/2022 CLINICAL DATA:  Mental status change with unknown cause EXAM: CT HEAD WITHOUT CONTRAST TECHNIQUE: Contiguous axial images were obtained from the base of the skull through the vertex without intravenous contrast. RADIATION DOSE REDUCTION: This exam was performed according to the departmental dose-optimization program which includes automated exposure control, adjustment of the mA and/or kV according to patient size and/or use of iterative reconstruction technique. COMPARISON:  None Available. FINDINGS: Brain: No evidence of acute infarction,  hemorrhage, hydrocephalus, extra-axial collection or mass lesion/mass effect. Generalized cerebral volume loss. Acute or infarct in the left thalamus which has a discrete and chronic appearance on reformats. Vascular: No hyperdense vessel or unexpected calcification. Skull: Normal. Negative for fracture or focal lesion. Sinuses/Orbits: No acute finding. IMPRESSION: 1. No acute finding. 2. Cerebral volume loss and chronic left thalamic infarct. Electronically Signed   By: Jorje Guild M.D.   On: 06/05/2022 06:07    Pending Labs Unresulted Labs (From admission, onward)     Start     Ordered   06/06/22 [id number]  Basic metabolic panel  Tomorrow morning,   R        06/05/22 0841   06/06/22 0500  CBC  Tomorrow morning,   R        06/05/22 0841            Vitals/Pain Today's Vitals   06/05/22 1230 06/05/22 1300 06/05/22 1610 06/05/22 1610  BP: 139/60 (!) 133/58 138/63   Pulse: 89 89 (!) 102   Resp: 17 (!) 21 (!) 23   Temp:    98.5 F (36.9 C)  TempSrc:    Oral  SpO2: 95% 95% 95%   Weight:      Height:      PainSc:        Isolation Precautions No active  isolations  Medications Medications  enoxaparin (LOVENOX) injection 30 mg (has no administration in time range)  docusate sodium (COLACE) capsule 100 mg (100 mg Oral Patient Refused/Not Given 06/05/22 0958)  polyethylene glycol (MIRALAX / GLYCOLAX) packet 17 g (has no administration in time range)  bisacodyl (DULCOLAX) EC tablet 5 mg (has no administration in time range)  ondansetron (ZOFRAN) tablet 4 mg (has no administration in time range)    Or  ondansetron (ZOFRAN) injection 4 mg (has no administration in time range)  hydrALAZINE (APRESOLINE) injection 5 mg (has no administration in time range)  insulin aspart (novoLOG) injection 0-9 Units (2 Units Subcutaneous Given 06/05/22 1641)  carvedilol (COREG) tablet 12.5 mg (12.5 mg Oral Given 06/05/22 1641)  sacubitril-valsartan (ENTRESTO) 24-26 mg per tablet (has no administration in  time range)  gabapentin (NEURONTIN) capsule 100 mg (has no administration in time range)  rosuvastatin (CRESTOR) tablet 40 mg (has no administration in time range)    Mobility walks     Focused Assessments Neuro Assessment Handoff:  Swallow screen pass? Yes          Neuro Assessment: Exceptions to WDL Neuro Checks:      Has TPA been given? No If patient is a Neuro Trauma and patient is going to OR before floor call report to [place] nurse: [phone] or [phone]   R Recommendations: See Admitting Provider Note  Report given to:   Additional Notes:

## 2022-06-05 NOTE — Discharge Instructions (Addendum)
Advised to follow-up with primary care physician in 1 week. Advised to discontinue insulin. Start glipizide 2.5 mg daily before breakfast. Continue to monitor blood sugars every 8 hours. Advised to follow-up with endocrinology as scheduled.  Appointment has been made.

## 2022-06-05 NOTE — TOC Initial Note (Signed)
Transition of Care [facility]) - Initial/Assessment Note    Patient Details  Name: [name] MRN: [id number] Date of Birth: Apr 25, 1935  [facility] CM/SW Contact:    Shelbie Hutching, RN Phone Number: 06/05/2022, 1:55 PM  Clinical Narrative:                 Patient placed under observation for hypoglycemia.  RNCM met with patient at the bedside, patient's boyfriend is at the bedside, she reports they have been together for 20 years.  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  She is usually independent.  Boyfriend drives.  Patient has a son, Legrand Como, who will be coming to the hospital soon.   PT is recommending speech consult and SNF.  Patient does not want to go to rehab she wants to go home.  Boyfriend would like to see her go to rehab.  They will discuss with her son when he gets here.  Explained that if she did not want SNF that we could arrange [facility] services to go out.   Patient is current with her PCP Dr. Humphrey Rolls with [facility].    [facility] will follow.   Expected Discharge Plan: Skilled Nursing Facility Barriers to Discharge: Continued Medical Work up   Patient Goals and CMS Choice Patient states their goals for this hospitalization and ongoing recovery are:: wants to go home CMS Medicare.gov Compare Post Acute Care list provided to:: Patient Choice offered to / list presented to : Patient      Expected Discharge Plan and Services   Discharge Planning Services: CM Consult   Living arrangements for the past 2 months: Single Family Home                                      Prior Living Arrangements/Services Living arrangements for the past 2 months: Single Family Home Lives with:: Significant Other, Roommate Patient language and need for interpreter reviewed:: Yes Do you feel safe going back to the place where you live?: Yes      Need for Family Participation in Patient Care: Yes (Comment) Care giver support system  in place?: Yes (comment) Current home services: DME (walker, cane) Criminal Activity/Legal Involvement Pertinent to Current Situation/Hospitalization: No - Comment as needed  Activities of Daily Living      Permission Sought/Granted Permission sought to share information with : Family Supports Permission granted to share information with : Yes, Verbal Permission Granted  Share Information with NAME: Maysam Mcgauley     Permission granted to share info w Relationship: son  Permission granted to share info w Contact Information: [phone]  Emotional Assessment Appearance:: Appears stated age Attitude/Demeanor/Rapport: Engaged Affect (typically observed): Accepting Orientation: : Oriented to Self, Oriented to Place Alcohol / Substance Use: Not Applicable Psych Involvement: No (comment)  Admission diagnosis:  Hypoglycemia [E16.2] Patient Active Problem List   Diagnosis Date Noted   Hypoglycemia [id number]   Chronic systolic congestive heart failure ([place]) 04/01/2016   Community acquired pneumonia 03/16/2016   HTN (hypertension) 03/15/2015   Routine general medical examination at a health care facility 09/28/2014   Anemia 09/28/2014   Postmenopausal bone loss 09/15/2014   Encounter for Medicare annual wellness exam 06/22/2013   Colon cancer screening 06/22/2013   Osteoporosis 03/02/2012   Other screening mammogram 01/31/2012   Post-menopausal 01/31/2012   Vertigo 01/31/2012   Tachycardia  09/11/2011   Scoliosis    ARM PAIN, RIGHT 03/29/2010   ALLERGIC RHINITIS 08/12/2007   ADVEF, DRUG/MEDICINAL/BIOLOGICAL SUBST NOS 08/20/2006   HERPES SIMPLEX INFECTION 07/28/2006   LYME DISEASE 07/28/2006   Diabetes type 2, controlled ([place]) 07/28/2006   Hyperlipidemia 07/28/2006   DEPRESSION 07/28/2006   Essential hypertension 07/28/2006   ASTHMA 07/28/2006   OVERACTIVE BLADDER 07/28/2006   OSTEOARTHRITIS 07/28/2006   DEGENERATIVE [place] DISEASE 07/28/2006   FIBROSITIS 07/28/2006    SCOLIOSIS 07/28/2006   LIVER FUNCTION TESTS, ABNORMAL 07/28/2006   PCP:  Allyne Gee, MD Pharmacy:   [id number]: [phone]: 3[place]Phone: [phone]: [phone] [facility] [facility] [place] [facility] [id number]: [phone]: [phone]    Social Determinants of Health (SDOH) Social History: SDOH Screenings   Tobacco Use: Low Risk  (09/30/2019)   SDOH Interventions:     Readmission Risk Interventions     No data to display

## 2022-06-05 NOTE — ED Triage Notes (Signed)
Patient arrived via EMS. Patient with altered mental status, left sided facial droop and not speaking on EMS arrival. Initial CBG 62. 20 G left FA and 125 ml D10 given. EMS report patient became alert and oriented, speaking in full clear sentences and no longer with facial droop. Repeat CBG 195. On hospital arrival, patient alert, oriented to name, place, and age but unable to state year or situation. Resp even, unlabored on RA.

## 2022-06-05 NOTE — Telephone Encounter (Signed)
Diabetes (DM) Review Call  [name],[name]  86 years, Female  DOB: October 01, 1935  M: [id number]  __________________________________________________ Diabetes (DM) Review (HC) Chart Review A1C Reading #1 (last): 8.7 on: 03/01/2022 A1C Reading #2: 7.1 on: 12/04/2021 The patient's glycemic control has: Worsened (> 0.3 increase) What recent interventions have been made by any provider to improve the patient's conditions in the last 3 months?: Office Visit: 04/11/22 Perrin Maltese MD. For follow-up. No medication changes. Has there been any documented recent hospitalizations or ED visits since last visit with Clinical Lead?: No Is the patient currently on a STATIN medication?: Yes Is the patient currently on ACE/ARB medication?: Yes Adherence Review Does the [facility] have access to medication refill data?: Yes Adherence rates for STAR metric medications: Rosuvastatin 40 mg - 04/03/22 90 DS Ezetimibe 10 mg - 10/26/21 90 DS Does the patient have >5 day gap between last estimated fill dates for any of the above medications?: Yes Reasons for medication gaps: Ezetimibe 10 mg - 10/26/21 90 DS Disease State Questions Able to connect with the Patient?: No Misc. Response/Information:: is she weighing her self daily and what her current weight is- that would be wonderful  [name] 05/29/22 - 05/30/22 - 05/31/22 - 06/05/22  Patient is in the hospital and was admitted into the hospital 06/04/22 .  Engagement Notes [name] on 06/05/2022 01:44 PM Patient is in the hospital and was admitted into the hospital 06/04/22 . [name], [name] on 05/29/2022 04:19 PM HC Chart Review: 7 min 05/29/22 HC Assessment call time spent: Unsuccessful Outreach - Patient is in the hospital.   Clinical Lead Review Review Adherence gaps identified?: No Drug Therapy Problems identified?: No Assessment: Uncontrolled Plan: HC tried to call patient for DM review call, however, found that the patient is in the hospital. Jerral Ralph, PharmD  Reviewed : 54mns 5 sec

## 2022-06-05 NOTE — H&P (Signed)
History and Physical    Patient: [name] [id number] DOB: 1935/10/20 DOA: 06/05/2022 DOS: the patient was seen and examined on 06/05/2022 PCP: Allyne Gee, MD  Patient coming from: Home - lives with boyfriend, daughter, granddaughter; Donald Prose: Pandora Leiter, [phone]   Chief Complaint: AMS  HPI: [name] is a 87 y.o. female with medical history significant of HTN, HLD, dementia, and DM presenting with AMS. Her son reports that about 49AM his sister called.  She had a bad dream and awoke.  She went into the living room and said the ghosts were getting her.  They put her back to bed and when they rechecked she was hallucinating again.  He called 911 and came over, she wasn't communicating.  She was breathing but seemed to be working hard.  EMS checked sugar and it was 60?, given glucagon.  Glucose went up and then went back down.  She seems to be back to her baseline now but he thinks she needs to stay overnight in the hospital.  At the time of my evaluation, the patient's son was uncertain about her home medications.  It appeared that the patient was taking metformin and glipizide.  However, the diabetes coordinator later spoke with the patient's boyfriend and he reported that he was giving 70/30 BID.  Her son also voiced concerns then that she was taking Ambien and Tylenol PM qhs.    ER Course:  Carryover, per Dr. Sidney Ace:  hypoglycemia. She was altered, hallucinating. Back to baseline once sugars improved with EMS. She is on a sulfonylurea. Eating and drinking and ordered D50 as needed but may need D10 infusion if sugars continue to drop.      Review of Systems: As mentioned in the history of present illness. All other systems reviewed and are negative.  Limited  by dementia.  Past Medical History:  Diagnosis Date   Asthma    childhood   Depression    Diabetes mellitus    Type II   Foot fracture, right 3/13   H/O scoliosis    HLD (hyperlipidemia)    HTN (hypertension)     Osteoarthritis    hands,elbow,knees   Osteoporosis    Rhinitis, allergic    Scoliosis    chronic back pain   Past Surgical History:  Procedure Laterality Date   bladder tack     CARDIAC CATHETERIZATION Right 03/19/2016   Procedure: Right/Left Heart Cath and Coronary Angiography;  Surgeon: Dionisio David, MD;  Location: [facility];  Service: Cardiovascular;  Laterality: Right;   TUBAL LIGATION     bilateral   Social History:  reports that she has never smoked. She has never used smokeless tobacco. She reports that she does not drink alcohol and does not use drugs.  Allergies  Allergen Reactions   Acetaminophen     REACTION: nausea   Codeine     REACTION: itching   Fluticasone Propionate     REACTION: palpitations   Fosamax [Alendronate Sodium]     Difficulty swallowing and fever/ joint pain    Lidocaine Nausea And Vomiting    Increases heart rate, N/V per pt   Naproxen     REACTION: GI   Nsaids     REACTION: elevated LFT's   Penicillins     REACTION: swelling at inj site    Family History  Problem Relation Age of Onset   Coronary artery disease Mother    Cancer Mother  thyroid   Heart attack Mother    Coronary artery disease Father    Heart attack Father    Multiple sclerosis Sister    Breast cancer Paternal Grandmother     Prior to Admission medications   Medication Sig Start Date End Date Taking? Authorizing Provider  albuterol (PROVENTIL HFA;VENTOLIN HFA) 108 (90 BASE) MCG/ACT inhaler Inhale 2 puffs into the lungs every 4 (four) hours as needed for wheezing. 06/22/13   Tower, Wynelle Fanny, MD  amitriptyline (ELAVIL) 25 MG tablet Take 1 tablet (25 mg total) by mouth at bedtime. 01/23/16   Tower, Wynelle Fanny, MD  aspirin 81 MG tablet Take 81 mg by mouth daily.      [provider]  atorvastatin (LIPITOR) 40 MG tablet TAKE 1 TABLET BY MOUTH DAILY Patient not taking: Reported on 03/16/2016 03/22/15   Nahser, Wonda Cheng, MD  Blood Glucose Monitoring  Suppl (ONETOUCH VERIO FLEX SYSTEM) W/DEVICE KIT 1 Device by Other route daily. Check blood sugar once daily and as directed. Dx E11.9 03/08/15   Tower, Wynelle Fanny, MD  Calcium Carbonate-Vitamin D (CALCIUM 600+D) 600-400 MG-UNIT per tablet Take 2 tablets by mouth daily.      [provider]  carvedilol (COREG) 12.5 MG tablet Take 1 tablet (12.5 mg total) by mouth 2 (two) times daily with a meal. 03/21/16   Max Sane, MD  cyanocobalamin (,VITAMIN B-12,) 1000 MCG/ML injection Inject 1 mL (1,000 mcg total) into the muscle every 6 (six) weeks. Patient not taking: Reported on 03/16/2016 02/20/15   Tower, Wynelle Fanny, MD  digoxin 62.5 MCG TABS Take 0.0625 mg by mouth daily. 03/22/16   Max Sane, MD  ENTRESTO 24-26 MG TAKE 1/2 (HALF) TABLET TWICE DAILY. 06/03/22   Perrin Maltese, MD  ezetimibe (ZETIA) 10 MG tablet TAKE 1 TABLET BY MOUTH EVERY DAY 06/03/22   Perrin Maltese, MD  fish oil-omega-3 fatty acids 1000 MG capsule Take 1 capsule by mouth 2 (two) times daily.     [provider]  glipiZIDE (GLUCOTROL XL) 5 MG 24 hr tablet Take 2 tablets (10 mg total) by mouth daily. 03/21/16   Max Sane, MD  isosorbide mononitrate (IMDUR) 30 MG 24 hr tablet Take 1 tablet (30 mg total) by mouth daily. 03/22/16   Max Sane, MD  meclizine (ANTIVERT) 25 MG tablet Take 25 mg by mouth as needed.    [provider]  metFORMIN (GLUCOPHAGE) 500 MG tablet TAKE 2 TABLETS BY MOUTH EVERY MORNING, 1 TAB AT LUNCH AND 1 TAB AT SUPPER 06/24/16   Tower, Nokomis A, MD  Multiple Vitamin (MULTIVITAMIN) tablet Take 1 tablet by mouth daily.      [provider]  nitroGLYCERIN (NITROSTAT) 0.4 MG SL tablet Place 1 tablet (0.4 mg total) under the tongue every 5 (five) minutes as needed for chest pain. 03/21/16   Max Sane, MD  omeprazole (PRILOSEC) 20 MG capsule Take 20 mg by mouth daily.    [provider]  ondansetron (ZOFRAN) 4 MG tablet Take 1 tablet (4 mg total) by mouth every 8 (eight) hours as  needed for nausea. 03/09/22   Nance Pear, MD  ONE [facility] LANCETS MISC Check blood sugar once daily and as directed. Dx E11.9 03/08/15   Tower, Wynelle Fanny, MD  ONETOUCH VERIO test strip CHECK BLOOD SUGAR ONCE DAILY AND AS DIRECTED. DX E11.9 09/09/16   Tower, Wynelle Fanny, MD  zolpidem (AMBIEN) 5 MG tablet Take 1 tablet (5 mg total) by mouth at bedtime as  needed for sleep. 03/21/16   Max Sane, MD    Physical Exam: Vitals:   06/05/22 1230 06/05/22 1300 06/05/22 1610 06/05/22 1610  BP: 139/60 (!) 133/58 138/63   Pulse: 89 89 (!) 102   Resp: 17 (!) 21 (!) 23   Temp:    98.5 F (36.9 C)  TempSrc:    Oral  SpO2: 95% 95% 95%   Weight:      Height:       General:  Appears calm and comfortable and is in NAD Eyes:   EOMI, normal lids, iris ENT:  grossly normal hearing, lips & tongue, mmm Neck:  no LAD, masses or thyromegaly Cardiovascular:  RRR, no m/r/g. No LE edema.  Respiratory:   CTA bilaterally with no wheezes/rales/rhonchi.  Normal respiratory effort. Abdomen:  soft, NT, ND Skin:  no rash or induration seen on limited exam Musculoskeletal:  grossly normal tone BUE/BLE, good ROM, no bony abnormality Psychiatric: pleasantly confused mood and affect, speech fluent, AOx1-2, perseverative about how she doesn't intend to harm anyone and she can't believe that anyone would think that about her Neurologic:  CN 2-12 grossly intact, moves all extremities in coordinated fashion   Radiological Exams on Admission: Independently reviewed - see discussion in A/P where applicable  CT HEAD WO CONTRAST (5MM)  Result Date: 06/05/2022 CLINICAL DATA:  Mental status change with unknown cause EXAM: CT HEAD WITHOUT CONTRAST TECHNIQUE: Contiguous axial images were obtained from the base of the skull through the vertex without intravenous contrast. RADIATION DOSE REDUCTION: This exam was performed according to the departmental dose-optimization program which includes automated exposure control, adjustment of  the mA and/or kV according to patient size and/or use of iterative reconstruction technique. COMPARISON:  None Available. FINDINGS: Brain: No evidence of acute infarction, hemorrhage, hydrocephalus, extra-axial collection or mass lesion/mass effect. Generalized cerebral volume loss. Acute or infarct in the left thalamus which has a discrete and chronic appearance on reformats. Vascular: No hyperdense vessel or unexpected calcification. Skull: Normal. Negative for fracture or focal lesion. Sinuses/Orbits: No acute finding. IMPRESSION: 1. No acute finding. 2. Cerebral volume loss and chronic left thalamic infarct. Electronically Signed   By: Jorje Guild M.D.   On: 06/05/2022 06:07    EKG: Independently reviewed.  NSR with rate 76; prolonged QTc 510; LBBB with no evidence of acute ischemia   Labs on Admission: I have personally reviewed the available labs and imaging studies at the time of the admission.  Pertinent labs:    BUN 22/Creatinine 1.26/GFR 42 Glucose 94, 85, 59, 98 Unremarkable CBC Unremarkable UA   Assessment and Plan: Principal Problem:   Hypoglycemia associated with diabetes ([place]) Active Problems:   Hyperlipidemia   Essential hypertension   Chronic systolic congestive heart failure (HCC)   Dementia with behavioral disturbance (HCC)   DNR (do not resuscitate)    Hypoglycemia with h/o DM -Patient with h/o DM -She was previously on oral medications but has transitioned to 70/30 (40 units BID) -A1c is 6.1 -Her A1c goal is 7-8 -Will hold basal insulin for now -Will cover with sensitive scale SSI -Diabetes coordinator is assisting with management -Will monitor overnight on med surg  Dementia -Patient has had progressive cognitive impairment -Son is concerned that she needs closer monitoring and he is considering moving her in with him -Will order delirium precautions -Recommend avoidance of sedating medications at night, if possible - will continue gabapentin (low  dose) -PT/OT, SLP cognitive/language consults, nutrition, TOC team consults  HTN -Continue carvedilol,  Entresto  HLD -Continue rosuvastatin -Hold Zetia, fish oil while inpatient  Chronic systolic CHF -0000000 echo with EF 10%, grade 1 DD -Continue carvedilol, Entresto -Appears compensated at this time  DNR -I have discussed code status with the patient and her son and they are in agreement that the patient would not desire resuscitation and would prefer to die a natural death should that situation arise. -She will need a gold out of facility DNR form at the time of discharge     Advance Care Planning:   Code Status: DNR   Consults: Diabetes coordinator; nutrition; PT/OT; SLP (cognitive/language); TOC team  DVT Prophylaxis: Lovenox  Family Communication: Son was present throughout evaluation  Severity of Illness: The appropriate patient status for this patient is OBSERVATION. Observation status is judged to be reasonable and necessary in order to provide the required intensity of service to ensure the patient's safety. The patient's presenting symptoms, physical exam findings, and initial radiographic and laboratory data in the context of their medical condition is felt to place them at decreased risk for further clinical deterioration. Furthermore, it is anticipated that the patient will be medically stable for discharge from the hospital within 2 midnights of admission.   Author: Karmen Bongo, MD 06/05/2022 4:37 PM  For on call review [email or web address].

## 2022-06-05 NOTE — ED Provider Notes (Signed)
[facility] Provider Note    Event Date/Time   First MD Initiated Contact with Patient 06/05/22 [phone]     (approximate)   History   Hypoglycemia   HPI  [name] is a 87 y.o. female history of diabetes, hypertension, hyperlipidemia, CHF, chronic kidney disease, anemia of chronic kidney disease, possible dementia who presents to the emergency department EMS for altered mental status and left-sided facial droop tonight.  EMS reports that she was not talking on their arrival but was awake.  Patient unable to provide much history.  She states she thinks that her son called 13 but no family currently at bedside.  She denies any pain.  EMS states they checked her blood sugar and it was 62.  They gave 125 mL of D10 and blood sugar improved, patient began talking and left sided facial droop resolved.   History provided by patient, EMS, son.    Past Medical History:  Diagnosis Date   Asthma    childhood   Depression    Diabetes mellitus    Type II   Foot fracture, right 3/13   H/O scoliosis    HLD (hyperlipidemia)    HTN (hypertension)    Osteoarthritis    hands,elbow,knees   Osteoporosis    Rhinitis, allergic    Scoliosis    chronic back pain    Past Surgical History:  Procedure Laterality Date   bladder tack     CARDIAC CATHETERIZATION Right 03/19/2016   Procedure: Right/Left Heart Cath and Coronary Angiography;  Surgeon: Dionisio David, MD;  Location: [facility];  Service: Cardiovascular;  Laterality: Right;   TUBAL LIGATION     bilateral    MEDICATIONS:  Prior to Admission medications   Medication Sig Start Date End Date Taking? Authorizing Provider  albuterol (PROVENTIL HFA;VENTOLIN HFA) 108 (90 BASE) MCG/ACT inhaler Inhale 2 puffs into the lungs every 4 (four) hours as needed for wheezing. 06/22/13   Tower, Wynelle Fanny, MD  amitriptyline (ELAVIL) 25 MG tablet Take 1 tablet (25 mg total) by mouth at bedtime. 01/23/16   Tower, Wynelle Fanny, MD  aspirin 81 MG tablet Take 81 mg by mouth daily.      [provider]  atorvastatin (LIPITOR) 40 MG tablet TAKE 1 TABLET BY MOUTH DAILY Patient not taking: Reported on 03/16/2016 03/22/15   Nahser, Wonda Cheng, MD  Blood Glucose Monitoring Suppl (ONETOUCH VERIO FLEX SYSTEM) W/DEVICE KIT 1 Device by Other route daily. Check blood sugar once daily and as directed. Dx E11.9 03/08/15   Tower, Wynelle Fanny, MD  Calcium Carbonate-Vitamin D (CALCIUM 600+D) 600-400 MG-UNIT per tablet Take 2 tablets by mouth daily.      [provider]  carvedilol (COREG) 12.5 MG tablet Take 1 tablet (12.5 mg total) by mouth 2 (two) times daily with a meal. 03/21/16   Max Sane, MD  cyanocobalamin (,VITAMIN B-12,) 1000 MCG/ML injection Inject 1 mL (1,000 mcg total) into the muscle every 6 (six) weeks. Patient not taking: Reported on 03/16/2016 02/20/15   Tower, Wynelle Fanny, MD  digoxin 62.5 MCG TABS Take 0.0625 mg by mouth daily. 03/22/16   Max Sane, MD  ENTRESTO 24-26 MG TAKE 1/2 (HALF) TABLET TWICE DAILY. 06/03/22   Perrin Maltese, MD  ezetimibe (ZETIA) 10 MG tablet TAKE 1 TABLET BY MOUTH EVERY DAY 06/03/22   Perrin Maltese, MD  fish oil-omega-3 fatty acids 1000 MG capsule Take 1 capsule by mouth 2 (two) times daily.  [provider]  glipiZIDE (GLUCOTROL XL) 5 MG 24 hr tablet Take 2 tablets (10 mg total) by mouth daily. 03/21/16   Max Sane, MD  isosorbide mononitrate (IMDUR) 30 MG 24 hr tablet Take 1 tablet (30 mg total) by mouth daily. 03/22/16   Max Sane, MD  meclizine (ANTIVERT) 25 MG tablet Take 25 mg by mouth as needed.    [provider]  metFORMIN (GLUCOPHAGE) 500 MG tablet TAKE 2 TABLETS BY MOUTH EVERY MORNING, 1 TAB AT LUNCH AND 1 TAB AT SUPPER 06/24/16   Tower, Ch[place] A, MD  Multiple Vitamin (MULTIVITAMIN) tablet Take 1 tablet by mouth daily.      [provider]  nitroGLYCERIN (NITROSTAT) 0.4 MG SL tablet Place 1 tablet (0.4 mg total) under the tongue every 5  (five) minutes as needed for chest pain. 03/21/16   Max Sane, MD  omeprazole (PRILOSEC) 20 MG capsule Take 20 mg by mouth daily.    [provider]  ondansetron (ZOFRAN) 4 MG tablet Take 1 tablet (4 mg total) by mouth every 8 (eight) hours as needed for nausea. 03/09/22   Nance Pear, MD  ONE [facility] LANCETS MISC Check blood sugar once daily and as directed. Dx E11.9 03/08/15   Tower, Wynelle Fanny, MD  ONETOUCH VERIO test strip CHECK BLOOD SUGAR ONCE DAILY AND AS DIRECTED. DX E11.9 09/09/16   Tower, Wynelle Fanny, MD  zolpidem (AMBIEN) 5 MG tablet Take 1 tablet (5 mg total) by mouth at bedtime as needed for sleep. 03/21/16   Max Sane, MD    Physical Exam   Triage Vital Signs: ED Triage Vitals  Enc Vitals Group     BP 06/05/22 0452 (!) 152/83     Pulse Rate 06/05/22 0452 77     Resp 06/05/22 0452 20     Temp 06/05/22 0452 (!) 97.5 F (36.4 C)     Temp src --      SpO2 06/05/22 0452 98 %     Weight 06/05/22 0453 154 lb 5.2 oz (70 kg)     Height 06/05/22 0453 5' 1"$  (1.549 m)     Head Circumference --      Peak Flow --      Pain Score 06/05/22 0453 0     Pain Loc --      Pain Edu? --      Excl. in [place]? --     Most recent vital signs: Vitals:   06/05/22 0530 06/05/22 0605  BP: (!) 144/61 (!) 143/67  Pulse: 81 78  Resp: 18 16  Temp:    SpO2: 97% 95%    CONSTITUTIONAL: Alert, responds appropriately to questions.  Oriented to person and place but not year.  Elderly, no distress HEAD: Normocephalic, atraumatic EYES: Conjunctivae clear, pupils appear equal, sclera nonicteric ENT: normal nose; moist mucous membranes NECK: Supple, normal ROM CARD: RRR; S1 and S2 appreciated RESP: Normal chest excursion without splinting or tachypnea; breath sounds clear and equal bilaterally; no wheezes, no rhonchi, no rales, no hypoxia or respiratory distress, speaking full sentences ABD/GI: Non-distended; soft, non-tender, no rebound, no guarding, no peritoneal signs BACK: The back appears  normal EXT: Normal ROM in all joints; no deformity noted, no edema SKIN: Normal color for age and race; warm; no rash on exposed skin NEURO: Moves all extremities equally, normal speech, no facial droop, cranial nerves II through XII intact PSYCH: The patient's mood and manner are appropriate.   ED Results / Procedures / Treatments  LABS: (all labs ordered are listed, but only abnormal results are displayed) Labs Reviewed  CBC WITH DIFFERENTIAL/PLATELET - Abnormal; Notable for the following components:      Result Value   RBC 3.84 (*)    Hemoglobin 11.1 (*)    HCT 34.9 (*)    All other components within normal limits  COMPREHENSIVE METABOLIC PANEL - Abnormal; Notable for the following components:   Creatinine, Ser 1.26 (*)    GFR, Estimated 42 (*)    All other components within normal limits  URINALYSIS, ROUTINE W REFLEX MICROSCOPIC - Abnormal; Notable for the following components:   Color, Urine STRAW (*)    APPearance CLEAR (*)    All other components within normal limits  CBG MONITORING, ED - Abnormal; Notable for the following components:   Glucose-Capillary 59 (*)    All other components within normal limits  CBG MONITORING, ED  CBG MONITORING, ED  CBG MONITORING, ED  CBG MONITORING, ED  CBG MONITORING, ED  CBG MONITORING, ED  CBG MONITORING, ED  CBG MONITORING, ED  CBG MONITORING, ED  CBG MONITORING, ED  CBG MONITORING, ED  CBG MONITORING, ED  CBG MONITORING, ED  CBG MONITORING, ED  CBG MONITORING, ED  CBG MONITORING, ED  CBG MONITORING, ED  CBG MONITORING, ED     EKG:  EKG Interpretation  Date/Time:  Wednesday June 05 2022 04:51:12 EST Ventricular Rate:  76 PR Interval:  164 QRS Duration: 143 QT Interval:  453 QTC Calculation: 510 R Axis:   -53 Text Interpretation: Sinus rhythm Left bundle branch block No significant change since last tracing Confirmed by Pryor Curia [phone]) on 06/05/2022 5:20:07 AM         RADIOLOGY: My personal review  and interpretation of imaging: CT head unremarkable.  I have personally reviewed all radiology reports.   CT HEAD WO CONTRAST (5MM)  Result Date: 06/05/2022 CLINICAL DATA:  Mental status change with unknown cause EXAM: CT HEAD WITHOUT CONTRAST TECHNIQUE: Contiguous axial images were obtained from the base of the skull through the vertex without intravenous contrast. RADIATION DOSE REDUCTION: This exam was performed according to the departmental dose-optimization program which includes automated exposure control, adjustment of the mA and/or kV according to patient size and/or use of iterative reconstruction technique. COMPARISON:  None Available. FINDINGS: Brain: No evidence of acute infarction, hemorrhage, hydrocephalus, extra-axial collection or mass lesion/mass effect. Generalized cerebral volume loss. Acute or infarct in the left thalamus which has a discrete and chronic appearance on reformats. Vascular: No hyperdense vessel or unexpected calcification. Skull: Normal. Negative for fracture or focal lesion. Sinuses/Orbits: No acute finding. IMPRESSION: 1. No acute finding. 2. Cerebral volume loss and chronic left thalamic infarct. Electronically Signed   By: Jorje Guild M.D.   On: 06/05/2022 06:07     PROCEDURES:  Critical Care performed:  YES   CRITICAL CARE Performed by: Cyril Mourning Tammi Boulier   Total critical care time: 40 minutes  Critical care time was exclusive of separately billable procedures and treating other patients.  Critical care was necessary to treat or prevent imminent or life-threatening deterioration.  Critical care was time spent personally by me on the following activities: development of treatment plan with patient and/or surrogate as well as nursing, discussions with consultants, evaluation of patient's response to treatment, examination of patient, obtaining history from patient or surrogate, ordering and performing treatments and interventions, ordering and review of  laboratory studies, ordering and review of radiographic studies, pulse oximetry and re-evaluation of patient's condition.   Marland Kitchen  1-3 Lead EKG Interpretation  Performed by: Datron Brakebill, Delice Bison, DO Authorized by: Bobi Daudelin, Delice Bison, DO     Interpretation: normal     ECG rate:  77   ECG rate assessment: normal     Rhythm: sinus rhythm     Ectopy: none     Conduction: normal       IMPRESSION / MDM / ASSESSMENT AND PLAN / ED COURSE  I reviewed the triage vital signs and the nursing notes.    Patient here with altered mental status, left facial droop with hypoglycemia.  Symptoms improved with administration of D10 with EMS.  The patient is on the cardiac monitor to evaluate for evidence of arrhythmia and/or significant heart rate changes.   DIFFERENTIAL DIAGNOSIS (includes but not limited to):   Hypoglycemia, dehydration, UTI, anemia, electrolyte derangement, CVA, intracranial hemorrhage   Patient's presentation is most consistent with acute presentation with potential threat to life or bodily function.   PLAN: Blood sugar has improved.  Will continue to closely monitor.  No focal neurologic deficits at this time but does have some mild confusion which may be due to underlying dementia.  Will obtain CBC, CMP, urinalysis, CT head.  EKG nonischemic.  Patient hemodynamically stable.   MEDICATIONS GIVEN IN ED: Medications  dextrose 50 % solution 50 mL (has no administration in time range)     ED COURSE: Son now at bedside.  He reports that patient was hallucinating, incontinent of urine, making abnormal facial expressions and sighing heavily.  There was no weakness, facial droop.  He states that now she appears back to normal.    CT head reviewed and interpreted by myself and the radiologist and shows no acute abnormality.  Normal hemoglobin, electrolytes, stable chronic kidney disease.  No UTI.  Son reports that she is on glipizide and metformin but unclear when she took her last doses.   Blood sugar has dropped now to 59.  Will allow her to eat and drink more but if it still stays low we will give D50.  Will discuss with hospitalist for admission for close monitoring and she may need D10 infusion.   CONSULTS:  Consulted and discussed patient's case with hospitalist, Dr. Sidney Ace.  I have recommended admission and consulting physician agrees and will place admission orders.  Patient (and family if present) agree with this plan.   I reviewed all nursing notes, vitals, pertinent previous records.  All labs, EKGs, imaging ordered have been independently reviewed and interpreted by myself.    OUTSIDE RECORDS REVIEWED: Reviewed last nephrology note in January 2021.       FINAL CLINICAL IMPRESSION(S) / ED DIAGNOSES   Final diagnoses:  Hypoglycemia  Altered mental status, unspecified altered mental status type  Hallucinations     Rx / DC Orders   ED Discharge Orders     None        Note:  This document was prepared using Dragon voice recognition software and may include unintentional dictation errors.   Lopaka Karge, Delice Bison, DO 06/05/22 [phone]

## 2022-06-05 NOTE — Evaluation (Signed)
Physical Therapy Evaluation Patient Details Name: [name] MRN: [id number] DOB: 07/31/1935 Today's Date: 06/05/2022  History of Present Illness  [name] is a 87 y.o. female history of diabetes, hypertension, hyperlipidemia, CHF, chronic kidney disease, anemia of chronic kidney disease, possible dementia who presents to the emergency department EMS for altered mental status and left-sided facial droop tonight.  EMS reports that she was not talking on their arrival but was awake.  Clinical Impression  Pt received in bed with her SO by her side. SO stated that she is much brighter person then what she is right now. Pt PLOF: Ind with all functional mobility in home and at community level without AD as per SO. Today's PT assessment revealed pt is oriented to self and person. Follows commands well and cooperative. Pt disoriented to situation, place, time and processing is slow. Pt rents her home's second level to 4 renters and manages it Independently. Pt demonstrated supine to sit,  STS and ambulated to the commode with FWW with Min guard withotu LOB and required max VC for direction, purpose and processing. Pt cognitive impairments and functional mobility  is evolving in ED since her arrival. If pt becomes cognitively at base line while in ED then the discharge plan may need to be updated as appropriate. Pt will benefit from PT in acute care and will benefit from SNF to return to her baseline so that she can return [facility] at [facility].   Recommendations for follow up therapy are one component of a multi-disciplinary discharge planning process, led by the attending physician.  Recommendations may be updated based on patient status, additional functional criteria and insurance authorization.  Follow Up Recommendations Skilled nursing-short term rehab (<3 hours/day) Can patient physically be transported by private vehicle: Yes    Assistance Recommended at Discharge Intermittent  Supervision/Assistance  Patient can return home with the following  A little help with walking and/or transfers;A little help with bathing/dressing/bathroom;Assistance with cooking/housework;Direct supervision/assist for medications management;Direct supervision/assist for financial management;Assist for transportation    Equipment Recommendations None recommended by PT  Recommendations for Other Services  Speech consult    Functional Status Assessment Patient has had a recent decline in their functional status and demonstrates the ability to make significant improvements in function in a reasonable and predictable amount of time.     Precautions / Restrictions Precautions Precautions: Fall Restrictions Weight Bearing Restrictions: No      Mobility  Bed Mobility:Bed Mobility: Supine to Sit, Sit to Supine     Supine to sit: Min guard Sit to supine: Min guard   General bed mobility comments: VC fro safetyu and processing.    Transfers Overall transfer level: Needs assistance Equipment used: Rolling walker (2 wheels) Transfers: Sit to/from Stand Sit to Stand: Min guard           General transfer comment: slow, VC for sequencing and directions    Ambulation/Gait Ambulation/Gait assistance: Min guard Gait Distance (Feet): 12 Feet Assistive device: Rolling walker (2 wheels), None Gait Pattern/deviations: Step-through pattern, Decreased stride length       General Gait Details: Pt needs CGA with and without AD. slow, VC for sequencing and directions  Stairs            Wheelchair Mobility    Modified Rankin (Stroke Patients Only)       Balance Overall balance assessment: Needs assistance Sitting-balance support: No upper extremity supported Sitting balance-Leahy Scale: Good     Standing balance support: Bilateral  upper extremity supported Standing balance-Leahy Scale: Good Standing balance comment: steady withotu LOB but weak and confused.                              Pertinent Vitals/Pain Pain Assessment Pain Assessment: No/denies pain    Home Living Family/patient expects to be discharged to:: Private residence Living Arrangements: Non-relatives/Friends;Other (Comment) (Pt rents out her 2nd floor to 57 female renters. and Has a boyfriend who is with her 24 hours.) Available Help at Discharge: Friend(s);Available 24 hours/day Type of Home: House Home Access: Level entry       Home Layout: Two level;Able to live on main level with bedroom/bathroom Home Equipment: None      Prior Function Prior Level of Function : Independent/Modified Independent             Mobility Comments: Pt ambulatory at household level and community level activity participation without AD. ADLs Comments: Ind with bathing, toileting, cooking, and IADls.     Hand Dominance        Extremity/Trunk Assessment   Upper Extremity Assessment Upper Extremity Assessment: Defer to OT evaluation    Lower Extremity Assessment Lower Extremity Assessment: Generalized weakness       Communication   Communication: No difficulties  Cognition Arousal/Alertness: Awake/alert Behavior During Therapy: WFL for tasks assessed/performed Overall Cognitive Status: Impaired/Different from baseline                                 General Comments: Disoriented to time, situation, place.        General Comments      Exercises     Assessment/Plan    PT Assessment Patient needs continued PT services  PT Problem List Decreased strength;Decreased activity tolerance;Decreased balance;Decreased safety awareness;Decreased cognition;Decreased mobility       PT Treatment Interventions Gait training;Functional mobility training;Therapeutic activities;Therapeutic exercise;Balance training;Neuromuscular re-education;Cognitive remediation;Patient/family education    PT Goals (Current goals can be found in the Care Plan section)  Acute Rehab  PT Goals PT Goal Formulation: Patient unable to participate in goal setting (But agrees with the recommendation) Time For Goal Achievement: 06/19/22 Potential to Achieve Goals: Good    Frequency Min 2X/week     Co-evaluation               AM-PAC PT "6 Clicks" Mobility  Outcome Measure Help needed turning from your back to your side while in a flat bed without using bedrails?: A Little Help needed moving from lying on your back to sitting on the side of a flat bed without using bedrails?: A Little Help needed moving to and from a bed to a chair (including a wheelchair)?: A Little Help needed standing up from a chair using your arms (e.g., wheelchair or bedside chair)?: A Little Help needed to walk in hospital room?: A Little Help needed climbing 3-5 steps with a railing? : A Lot 6 Click Score: 17    End of Session Equipment Utilized During Treatment: Gait belt Activity Tolerance: Patient tolerated treatment well Patient left: in bed;with call bell/phone within reach;with chair alarm set;with family/visitor present Nurse Communication: Mobility status PT Visit Diagnosis: Other abnormalities of gait and mobility (R26.89);Muscle weakness (generalized) (M62.81);Difficulty in walking, not elsewhere classified (R26.2)    Time: [id number] PT Time Calculation (min) (ACUTE ONLY): 29 min   Charges:   PT Evaluation $PT Eval Low Complexity: 1 Low  PT Treatments $Gait Training: 8-22 mins   Joaquin Music PT DPT 2:41 PM,06/05/22

## 2022-06-05 NOTE — Inpatient Diabetes Management (Addendum)
Inpatient Diabetes Program Recommendations  AACE/ADA: New Consensus Statement on Inpatient Glycemic Control (2015)  Target Ranges:  Prepandial:   less than 140 mg/dL      Peak postprandial:   less than 180 mg/dL (1-2 hours)      Critically ill patients:  140 - 180 mg/dL    Latest Reference Range & Units 06/05/22 04:53 06/05/22 06:06 06/05/22 07:11  Glucose-Capillary 70 - 99 mg/dL 94 59 (L) 98  (L): Data is abnormally low   Admit with: Hypoglycemia  History: DM2, CKD  Home DM Meds: Glipizide 10 mg daily        Metformin 1000 mg AM/ 500 mg lunch/ 500 mg Dinner  Current: Novolog 0-9 units TID     MD- Note Hypoglycemia on admission  Given History of CKD, may need to reduce home dose Glipizide when pt discharges home (or may need to stop altogether)   Addendum 9:40am--Met w/ pt and her SO Kym Groom in the ED.  Pt seemed pleasantly confused but her SO was able to answer all questions for me.  SO lives in pt's home with her and helps manage pt's medications and gives pt all her meds.  SO told me pt just got a new CBG meter a short while ago--he checks her CBGs BID at home and told me he usually gets CBG readings 120-150.  No HYPO at home.  SO told me pt is NOT taking Glipizide nor Metformin but that pt is taking Novolog insulin 40 units BID--I asked SO if he meant Novolog 70/30 Insulin, and he told me he wasn't 100% sure if it is Novolog or Novolog 70/30 Insulin but that he gives pt the insulin 40 units BID Bfast and Dinner.  No issues with eating at home per SO--pt ate normal meal at dinner last night and he gave her the 40 units of Insulin.  We talked about best injection sites for insulin (stomach and back of arm) and importance of rotation.  SO told me he has diabetes as well and has some knowledge of the disease.    I called Pt's son Kiyah Outten [phone]).  Son confirmed pt's SO takes care of all pt's meds and checks her CBGs but son could not confirm the insulin nor the doses.   He told me to call Dr. Lamonte Sakai with [facility] to confirm insulin--I have placed a call to this office requesting someone call me back to review/confirm pt's home DM medication regimen.  Reviewed with son HYPO signs and symptoms and treatment.  Son told me he was concerned that pt taking Ambien QHS and sometimes takes Tylenol PM for sleep.  I told pt I would pass his concerns on to the Attending MD.    Addendum: I also called pt's PCP office (Dr. Humphrey Rolls with [facility]) to clarify home DM meds.  I left VM with medical rep at the office.  Have not heard back yet at 2pm.    --Will follow patient during hospitalization--  Wyn Quaker RN, MSN, [facility] Diabetes Coordinator Inpatient Glycemic Control Team Team Pager: [phone] (8a-5p)

## 2022-06-05 NOTE — Evaluation (Signed)
Occupational Therapy Evaluation Patient Details Name: [name] MRN: [id number] DOB: September 24, 1935 Today's Date: 06/05/2022   History of Present Illness [name] is a 87 y.o. female history of diabetes, hypertension, hyperlipidemia, CHF, chronic kidney disease, anemia of chronic kidney disease, possible dementia who presents to the emergency department EMS for altered mental status and left-sided facial droop tonight.  EMS reports that she was not talking on their arrival but was awake.   Clinical Impression   Patient presenting with decreased Ind in self care, balance, functional mobility/transfers, endurance, ands safety awareness. Patient's friend present during session and reports pt is independent at baseline without use of AD. Pt is having difficulty with word find and generally confused during session. When asked month she was unable to state and stares at watch face and attempts to tell therapist time instead (also not able to tell correct time from watch). Pt needing increased time to process and min - mod cuing for safety awareness and technique. Min guard - min A HHA for mobility tasks and unsteady. OT recommends SNF to address functional deficits. If pt were to return home, she would need 24/7 supervision/assist secondary to cognition. OT did request SLP order.  Patient will benefit from acute OT to increase overall independence in the areas of ADLs, functional mobility, and safety awareness in order to safely discharge to next venue of care.      Recommendations for follow up therapy are one component of a multi-disciplinary discharge planning process, led by the attending physician.  Recommendations may be updated based on patient status, additional functional criteria and insurance authorization.   Follow Up Recommendations  Skilled nursing-short term rehab (<3 hours/day)     Assistance Recommended at Discharge Frequent or constant Supervision/Assistance  Patient can  return home with the following A little help with walking and/or transfers;A little help with bathing/dressing/bathroom;Help with stairs or ramp for entrance;Assist for transportation;Assistance with cooking/housework    Functional Status Assessment  Patient has had a recent decline in their functional status and demonstrates the ability to make significant improvements in function in a reasonable and predictable amount of time.  Equipment Recommendations  Other (comment) (defer to next venue of care)       Precautions / Restrictions Precautions Precautions: Fall Restrictions Weight Bearing Restrictions: No      Mobility Bed Mobility Overal bed mobility: Needs Assistance Bed Mobility: Supine to Sit, Sit to Supine     Supine to sit: Min guard Sit to supine: Min guard        Transfers Overall transfer level: Needs assistance Equipment used: 1 person hand held assist Transfers: Sit to/from Stand, Bed to chair/wheelchair/BSC Sit to Stand: Min guard Stand pivot transfers: Min assist                Balance Overall balance assessment: Needs assistance Sitting-balance support: No upper extremity supported Sitting balance-Leahy Scale: Good     Standing balance support: Bilateral upper extremity supported, During functional activity Standing balance-Leahy Scale: Good                             ADL either performed or assessed with clinical judgement   ADL Overall ADL's : Needs assistance/impaired                                       General ADL  Comments: min guard- min A overall for self care tasks and functional mobility     Vision Patient Visual Report: No change from baseline              Pertinent Vitals/Pain Pain Assessment Pain Assessment: No/denies pain     Hand Dominance Right   Extremity/Trunk Assessment Upper Extremity Assessment Upper Extremity Assessment: Generalized weakness   Lower Extremity  Assessment Lower Extremity Assessment: Generalized weakness       Communication Communication Communication: No difficulties   Cognition Arousal/Alertness: Awake/alert Behavior During Therapy: WFL for tasks assessed/performed Overall Cognitive Status: Impaired/Different from baseline Area of Impairment: Orientation, Attention, Memory, Following commands, Safety/judgement, Awareness, Problem solving                 Orientation Level: Disoriented to, Time, Situation, Place Current Attention Level: Focused   Following Commands: Follows one step commands consistently Safety/Judgement: Decreased awareness of safety Awareness: Intellectual Problem Solving: Slow processing, Difficulty sequencing, Requires verbal cues                  Home Living Family/patient expects to be discharged to:: Private residence Living Arrangements: Non-relatives/Friends;Other (Comment) (has friend who is with her 24/7 and 4 renters on second floor of home) Available Help at Discharge: Available 24 hours/day;Friend(s) Type of Home: House Home Access: Level entry     Home Layout: Two level;Able to live on main level with bedroom/bathroom     Bathroom Shower/Tub: Teacher, early years/pre: Standard     Home Equipment: None          Prior Functioning/Environment Prior Level of Function : Independent/Modified Independent             Mobility Comments: Pt ambulatory at household level and community level activity participation without AD. ADLs Comments: Ind with bathing, toileting, and shares IADL tasks with friend        OT Problem List: Decreased strength;Decreased activity tolerance;Decreased safety awareness;Impaired balance (sitting and/or standing);Decreased knowledge of use of DME or AE;Decreased cognition      OT Treatment/Interventions: Self-care/ADL training;Therapeutic exercise;Therapeutic activities;DME and/or AE instruction;Patient/family education;Balance  training;Manual therapy;Energy conservation    OT Goals(Current goals can be found in the care plan section) Acute Rehab OT Goals Patient Stated Goal: to get stronger OT Goal Formulation: With patient/family Time For Goal Achievement: 06/19/22 Potential to Achieve Goals: Good ADL Goals Pt Will Perform Grooming: with supervision;standing Pt Will Perform Lower Body Dressing: with supervision;sit to/from stand Pt Will Transfer to Toilet: with supervision;ambulating Pt Will Perform Toileting - Clothing Manipulation and hygiene: with supervision;sit to/from stand  OT Frequency: Min 2X/week    AM-PAC OT "6 Clicks" Daily Activity     Outcome Measure Help from another person eating meals?: None Help from another person taking care of personal grooming?: A Little Help from another person toileting, which includes using toliet, bedpan, or urinal?: A Lot Help from another person bathing (including washing, rinsing, drying)?: A Lot Help from another person to put on and taking off regular upper body clothing?: A Little Help from another person to put on and taking off regular lower body clothing?: A Lot 6 Click Score: 16   End of Session Nurse Communication: Mobility status  Activity Tolerance: Patient tolerated treatment well Patient left: in bed;with call bell/phone within reach;with bed alarm set  OT Visit Diagnosis: Unsteadiness on feet (R26.81);Repeated falls (R29.6);Muscle weakness (generalized) (M62.81)                Time:  [id number] OT Time Calculation (min): 21 min Charges:  OT General Charges $OT Visit: 1 Visit OT Evaluation $OT Eval Moderate Complexity: 1 Mod OT Treatments $Therapeutic Activity: 8-22 mins  Darleen Crocker, MS, OTR/L , CBIS ascom [phone]  06/05/22, 3:07 PM

## 2022-06-06 DIAGNOSIS — E11649 Type 2 diabetes mellitus with hypoglycemia without coma: Secondary | ICD-10-CM | POA: Diagnosis not present

## 2022-06-06 LAB — CBC
HCT: 30.7 % — ABNORMAL LOW (ref 36.0–46.0)
Hemoglobin: 9.9 g/dL — ABNORMAL LOW (ref 12.0–15.0)
MCH: 28.9 pg (ref 26.0–34.0)
MCHC: 32.2 g/dL (ref 30.0–36.0)
MCV: 89.5 fL (ref 80.0–100.0)
Platelets: 213 10*3/uL (ref 150–400)
RBC: 3.43 MIL/uL — ABNORMAL LOW (ref 3.87–5.11)
RDW: 15 % (ref 11.5–15.5)
WBC: 7.5 10*3/uL (ref 4.0–10.5)
nRBC: 0 % (ref 0.0–0.2)

## 2022-06-06 LAB — BASIC METABOLIC PANEL
Anion gap: 9 (ref 5–15)
BUN: 17 mg/dL (ref 8–23)
CO2: 23 mmol/L (ref 22–32)
Calcium: 9.2 mg/dL (ref 8.9–10.3)
Chloride: 106 mmol/L (ref 98–111)
Creatinine, Ser: 1.24 mg/dL — ABNORMAL HIGH (ref 0.44–1.00)
GFR, Estimated: 43 mL/min — ABNORMAL LOW (ref >60)
Glucose, Bld: 155 mg/dL — ABNORMAL HIGH (ref 70–99)
Potassium: 3.8 mmol/L (ref 3.5–5.1)
Sodium: 138 mmol/L (ref 135–145)

## 2022-06-06 LAB — GLUCOSE, CAPILLARY
Glucose-Capillary: 145 mg/dL — ABNORMAL HIGH (ref 70–99)
Glucose-Capillary: 170 mg/dL — ABNORMAL HIGH (ref 70–99)
Glucose-Capillary: 181 mg/dL — ABNORMAL HIGH (ref 70–99)
Glucose-Capillary: 162 mg/dL — ABNORMAL HIGH (ref 70–99)

## 2022-06-06 MED ORDER — ENOXAPARIN SODIUM 40 MG/0.4ML IJ SOSY
40.0000 mg | PREFILLED_SYRINGE | INTRAMUSCULAR | Status: DC
  Administered 2022-06-06 – 2022-06-07 (×2): 40 mg via SUBCUTANEOUS
  Filled 2022-06-06 (×2): qty 0.4

## 2022-06-06 MED ORDER — DOCUSATE SODIUM 100 MG PO CAPS: 100.0000 mg | ORAL_CAPSULE | Freq: Two times a day (BID) | ORAL | Status: DC | PRN

## 2022-06-06 NOTE — Care Management Obs Status (Signed)
[facility] NOTIFICATION   Patient Details  Name: [name] MRN: [id number] Date of Birth: 04/15/1936   Medicare Observation Status Notification Given:       Gerilyn Pilgrim, LCSW 06/06/2022, 10:26 AM

## 2022-06-06 NOTE — Progress Notes (Signed)
Physical Therapy Treatment Patient Details Name: [name] MRN: [id number] DOB: Mar 14, 1936 Today's Date: 06/06/2022   History of Present Illness [name] is a 87 y.o. female history of diabetes, hypertension, hyperlipidemia, CHF, chronic kidney disease, anemia of chronic kidney disease, possible dementia who presents to the emergency department EMS for altered mental status and left-sided facial droop tonight.  EMS reports that she was not talking on their arrival but was awake.    PT Comments    Pt is progressing with strength and activity tolerance increasing gait distance with RW to 73f at close supervision level.  Pt can perform transfers and household type gait negotiation with RW at supervision level but requires cues for safe use of RW and encouragement to use RW.  Recommend pt use walker when up at this time to reduce fall risk.  Current D/C plan is still appropriate.   Recommendations for follow up therapy are one component of a multi-disciplinary discharge planning process, led by the attending physician.  Recommendations may be updated based on patient status, additional functional criteria and insurance authorization.  Follow Up Recommendations  Skilled nursing-short term rehab (<3 hours/day) Can patient physically be transported by private vehicle: Yes   Assistance Recommended at Discharge Frequent or constant Supervision/Assistance (due to cognition and safety.)  Patient can return home with the following A little help with walking and/or transfers;A little help with bathing/dressing/bathroom;Assistance with cooking/housework;Direct supervision/assist for medications management;Direct supervision/assist for financial management;Assist for transportation   Equipment Recommendations  None recommended by PT    Recommendations for Other Services Speech consult     Precautions / Restrictions Precautions Precautions: Fall Restrictions Weight Bearing Restrictions:  No     Mobility  Bed Mobility               General bed mobility comments: Pt up in recliner    Transfers Overall transfer level: Needs assistance Equipment used: Rolling walker (2 wheels) Transfers: Sit to/from Stand, Bed to chair/wheelchair/BSC Sit to Stand: Supervision Stand pivot transfers: Supervision         General transfer comment: slow, VC for sequencing and directions and to use RW    Ambulation/Gait Ambulation/Gait assistance: Supervision Gait Distance (Feet): 60 Feet Assistive device: Rolling walker (2 wheels) Gait Pattern/deviations: Step-through pattern, Decreased stride length       General Gait Details: Pt needs supervision with AD. slow, VC for sequencing and directions   Stairs             Wheelchair Mobility    Modified Rankin (Stroke Patients Only)       Balance Overall balance assessment: Modified Independent Sitting-balance support: No upper extremity supported Sitting balance-Leahy Scale: Good Sitting balance - Comments: No imbalance noted, supervision for safety.   Standing balance support: No upper extremity supported Standing balance-Leahy Scale: Good Standing balance comment: steady with functional UE task with RW or counter in fron of pt; intermittent UE support needed.                            Cognition Arousal/Alertness: Awake/alert Behavior During Therapy: WFL for tasks assessed/performed Overall Cognitive Status: Impaired/Different from baseline Area of Impairment: Orientation, Attention, Memory, Following commands, Safety/judgement, Awareness, Problem solving                 Orientation Level: Disoriented to, Time, Situation, Place Current Attention Level: Focused   Following Commands: Follows one step commands consistently Safety/Judgement: Decreased awareness of safety  Awareness: Intellectual Problem Solving: Slow processing, Difficulty sequencing, Requires verbal cues General  Comments: Disoriented to time, situation, place.        Exercises      General Comments        Pertinent Vitals/Pain Pain Assessment Pain Assessment: No/denies pain    Home Living                          Prior Function            PT Goals (current goals can now be found in the care plan section) Acute Rehab PT Goals PT Goal Formulation: Patient unable to participate in goal setting Time For Goal Achievement: 06/19/22 Potential to Achieve Goals: Good Progress towards PT goals: Progressing toward goals    Frequency    Min 2X/week      PT Plan      Co-evaluation              AM-PAC PT "6 Clicks" Mobility   Outcome Measure  Help needed turning from your back to your side while in a flat bed without using bedrails?: A Little Help needed moving from lying on your back to sitting on the side of a flat bed without using bedrails?: A Little Help needed moving to and from a bed to a chair (including a wheelchair)?: A Little Help needed standing up from a chair using your arms (e.g., wheelchair or bedside chair)?: A Little Help needed to walk in hospital room?: A Little   6 Click Score: 15    End of Session Equipment Utilized During Treatment: Gait belt Activity Tolerance: Patient tolerated treatment well Patient left: with call bell/phone within reach;with chair alarm set;with family/visitor present;in chair Nurse Communication: Mobility status PT Visit Diagnosis: Other abnormalities of gait and mobility (R26.89);Muscle weakness (generalized) (M62.81);Difficulty in walking, not elsewhere classified (R26.2)     Time: [id number] PT Time Calculation (min) (ACUTE ONLY): 25 min  Charges:  $Gait Training: 8-22 mins $Therapeutic Activity: 8-22 mins                     Bjorn Loser, PTA  06/06/22, 12:42 PM

## 2022-06-06 NOTE — NC FL2 (Addendum)
[place] LEVEL OF CARE FORM     IDENTIFICATION  Patient Name: [name] Birthdate: 08-Sep-1935 Sex: female Admission Date (Current Location): 06/05/2022  [facility] and [place] Number:  Engineering geologist and Address:  [facility], [address], [place], [place] [address]      Provider Number: [id number]  Attending Physician Name and Address:  Shawna Clamp, MD  Relative Name and Phone Number:  [place], [facility]) [phone]    Current Level of Care: Hospital Recommended Level of Care: [place] Prior Approval Number:    Date Approved/Denied:   PASRR Number: [id number]  Discharge Plan: SNF    Current Diagnoses: Patient Active Problem List   Diagnosis Date Noted   Hypoglycemia associated with diabetes ([place]) 06/05/2022   Dementia with behavioral disturbance ([place]) 06/05/2022   DNR (do not resuscitate) [id number]   Chronic systolic congestive heart failure ([place]) 04/01/2016   Community acquired pneumonia 03/16/2016   Routine general medical examination at a health care facility 09/28/2014   Anemia 09/28/2014   Postmenopausal bone loss 09/15/2014   Encounter for Medicare annual wellness exam 06/22/2013   Colon cancer screening 06/22/2013   Osteoporosis 03/02/2012   Other screening mammogram 01/31/2012   Post-menopausal 01/31/2012   Vertigo 01/31/2012   Tachycardia 09/11/2011   Scoliosis    ARM PAIN, RIGHT 03/29/2010   ALLERGIC RHINITIS 08/12/2007   ADVEF, DRUG/MEDICINAL/BIOLOGICAL SUBST NOS 08/20/2006   HERPES SIMPLEX INFECTION 07/28/2006   LYME DISEASE 07/28/2006   Diabetes type 2, controlled ([place]) 07/28/2006   Hyperlipidemia 07/28/2006   DEPRESSION 07/28/2006   Essential hypertension 07/28/2006   ASTHMA 07/28/2006   OVERACTIVE BLADDER 07/28/2006   OSTEOARTHRITIS 07/28/2006   DEGENERATIVE DISC DISEASE 07/28/2006   FIBROSITIS 07/28/2006   SCOLIOSIS 07/28/2006   LIVER FUNCTION TESTS, ABNORMAL  07/28/2006    Orientation RESPIRATION BLADDER Height & Weight     Self  Normal Continent Weight: 154 lb 5.2 oz (70 kg) Height:  5' 1"$  (154.9 cm)  BEHAVIORAL SYMPTOMS/MOOD NEUROLOGICAL BOWEL NUTRITION STATUS      Continent Diet (carb modified)  AMBULATORY STATUS COMMUNICATION OF NEEDS Skin   Limited Assist Verbally Normal                       Personal Care Assistance Level of Assistance  Bathing, Feeding, Dressing Bathing Assistance: Maximum assistance Feeding assistance: Limited assistance Dressing Assistance: Limited assistance     Functional Limitations Info  Sight, Hearing, Speech Sight Info: Adequate Hearing Info: Adequate Speech Info: Adequate    SPECIAL CARE FACTORS FREQUENCY  PT (By licensed PT), OT (By licensed OT)     PT Frequency: 5 times a week OT Frequency: 5 times a week            Contractures Contractures Info: Not present    Additional Factors Info  Code Status, Allergies Code Status Info: DNR Allergies Info: Acetaminophen  Codeine  Fluticasone Propionate  Fosamax (Alendronate Sodium)  Lidocaine  Naproxen  Nsaids  Penicillins           Current Medications (06/06/2022):  This is the current hospital active medication list Current Facility-Administered Medications  Medication Dose Route Frequency Provider Last Rate Last Admin   bisacodyl (DULCOLAX) EC tablet 5 mg  5 mg Oral Daily PRN Karmen Bongo, MD       carvedilol (COREG) tablet 12.5 mg  12.5 mg Oral BID WC Karmen Bongo, MD   12.5 mg at 06/06/22 0839   docusate  sodium (COLACE) capsule 100 mg  100 mg Oral BID PRN Shawna Clamp, MD       enoxaparin (LOVENOX) injection 40 mg  40 mg Subcutaneous Q24H Nazari, Walid A, RPH       gabapentin (NEURONTIN) capsule 100 mg  100 mg Oral QHS Karmen Bongo, MD   100 mg at 06/05/22 2201   hydrALAZINE (APRESOLINE) injection 5 mg  5 mg Intravenous Q4H PRN Karmen Bongo, MD       insulin aspart (novoLOG) injection 0-9 Units  0-9 Units  Subcutaneous TID WC Karmen Bongo, MD   2 Units at 06/06/22 1154   ondansetron (ZOFRAN) tablet 4 mg  4 mg Oral Q6H PRN Karmen Bongo, MD       Or   ondansetron [facility]) injection 4 mg  4 mg Intravenous Q6H PRN Karmen Bongo, MD       polyethylene glycol (MIRALAX / GLYCOLAX) packet 17 g  17 g Oral Daily PRN Karmen Bongo, MD       rosuvastatin (CRESTOR) tablet 40 mg  40 mg Oral Daily Karmen Bongo, MD   40 mg at 06/06/22 [id number]   sacubitril-valsartan (ENTRESTO) 24-26 mg per tablet  0.5 tablet Oral BID Karmen Bongo, MD   0.5 tablet at 06/06/22 [id number]     Discharge Medications: Please see discharge summary for a list of discharge medications.  Relevant Imaging Results:  Relevant Lab Results:   Additional Information SS# [id number]  Gerilyn Pilgrim, LCSW

## 2022-06-06 NOTE — TOC PASRR Note (Signed)
RE: [name]  Date of Birth: 1935/08/02  Date: 06/06/2021     To Whom It May Concern:   Please be advised that the above-named patient will require a short-term nursing home stay - anticipated 30 days or less for rehabilitation and strengthening.  The plan is for return home

## 2022-06-06 NOTE — Evaluation (Incomplete)
{  EVAL [id number]}

## 2022-06-06 NOTE — Progress Notes (Signed)
PROGRESS NOTE    [name]  [id number] DOB: 1935-11-02 DOA: 06/05/2022 PCP: Allyne Gee, MD    Brief Narrative: This 87 years old female with PMH significant for hypertension, hyperlipidemia, dementia and diabetes presented with altered mental status.  Son reports that at about 3:45 AM his sister called.  She went to the living room and has noticed the change in her behavior.  They put her back into the bed and they rechecked she was still hallucinating.  They called 911 and was brought in the ED.  She was found to have blood sugar of 60, she was given glucagon.  Glucose went up and then went back down.  She seems back to her baseline mental status but still feeling weak and tired.  At home patient was taking metformin and glipizide which was changed to 70/ 30 twice daily.  Patient is admitted for hypoglycemia and started on D10W infusion.  Assessment & Plan:   Principal Problem:   Hypoglycemia associated with diabetes ([place]) Active Problems:   Hyperlipidemia   Essential hypertension   Chronic systolic congestive heart failure (HCC)   Dementia with behavioral disturbance (HCC)   DNR (do not resuscitate)  Acute metabolic encephalopathy due to hypoglycemia: Hypoglycemia with history of diabetes mellitus: Patient was previously on oral diabetic medications but which were recently transitioned to  humolog 70/ 30 ( 40 units twice daily).  Last hemoglobin A1c 6.1.  Patient started on D10W infusion. Continue with regular insulin sliding scale.  Hold long-acting insulin at this time.   Diabetic coordinator consulted. Hypoglycemia improved.  Discontinue D10W infusion. She is back to her baseline mental status.  Dementia: Patient has had progressive cognitive impairment. Son is concerned that she needs close monitoring and he is considering moving her with him. Monitor delirium precautions. PT and OT recommended skilled nursing facility.  TOC notified.  Essential  hypertension: Continue Coreg and Entresto  Hyperlipidemia Continue Crestor, hold Zetia , fish oil while inpatient  Chronic systolic CHF: In 0000000 echo shows LVEF 10%. Continue carvedilol and Entresto.   She appears compensated at this time.   DVT prophylaxis: Lovenox Code Status: DNR Family Communication: No family at bed side. Disposition Plan:    Status is: Observation The patient remains OBS appropriate and will d/c before 2 midnights.  Admitted for altered mental status sec.to hypoglycemia  Patient is recommended SNF.   Consultants:  None  Procedures: None  Antimicrobials:  Anti-infectives (From admission, onward)    None       Subjective: Patient is seen and examined at bedside.  Overnight events noted.   Patient was sitting comfortably on the chair having chit chatting with her friend in the room.   Feels better and back to baseline.  Objective: Vitals:   06/05/22 1747 06/05/22 1931 06/06/22 0413 06/06/22 0824  BP: (!) 148/58 (!) 144/65 136/74 (!) 145/72  Pulse: 99 92 (!) 103 93  Resp: 20 18 17 19  $ Temp: 98.2 F (36.8 C) 98.3 F (36.8 C) 98.8 F (37.1 C) 98.1 F (36.7 C)  TempSrc: Oral   Oral  SpO2: 98% 98% 100% 95%  Weight:      Height:        Intake/Output Summary (Last 24 hours) at 06/06/2022 1144 Last data filed at 06/06/2022 1005 Gross per 24 hour  Intake 240 ml  Output --  Net 240 ml   Filed Weights   06/05/22 0453  Weight: 70 kg    Examination:  General exam: Appears  calm and comfortable, deconditioned. Respiratory system: Clear to auscultation. Respiratory effort normal.  RR 15 Cardiovascular system: S1 & S2 heard, RRR. No JVD, murmurs, rubs, gallops or clicks. No pedal edema. Gastrointestinal system: Abdomen is soft,  non tender, non distended, BS+ Central nervous system: Alert and oriented x 1. No focal neurological deficits. Extremities: No edema, no cyanosis, no clubbing Skin: No rashes, lesions or ulcers Psychiatry: Mood &  affect appropriate.     Data Reviewed: I have personally reviewed following labs and imaging studies  CBC: Recent Labs  Lab 06/05/22 0500 06/06/22 0422  WBC 8.3 7.5  NEUTROABS 5.0  --   HGB 11.1* 9.9*  HCT 34.9* 30.7*  MCV 90.9 89.5  PLT 217 [id number]   Basic Metabolic Panel: Recent Labs  Lab 06/05/22 0500 06/06/22 0422  NA 138 138  K 3.6 3.8  CL 104 106  CO2 24 23  GLUCOSE 85 155*  BUN 22 17  CREATININE 1.26* 1.24*  CALCIUM 9.7 9.2   GFR: Estimated Creatinine Clearance: 30.2 mL/min (A) (by C-G formula based on SCr of 1.24 mg/dL (H)). Liver Function Tests: Recent Labs  Lab 06/05/22 0500  AST 32  ALT 24  ALKPHOS 45  BILITOT 0.6  PROT 7.7  ALBUMIN 3.7   No results for input(s): "LIPASE", "AMYLASE" in the last 168 hours. No results for input(s): "AMMONIA" in the last 168 hours. Coagulation Profile: No results for input(s): "INR", "PROTIME" in the last 168 hours. Cardiac Enzymes: No results for input(s): "CKTOTAL", "CKMB", "CKMBINDEX", "TROPONINI" in the last 168 hours. BNP (last 3 results) No results for input(s): "PROBNP" in the last 8760 hours. HbA1C: Recent Labs    06/05/22 0500  HGBA1C 6.1*   CBG: Recent Labs  Lab 06/05/22 1125 06/05/22 1604 06/05/22 1853 06/05/22 2142 06/06/22 0823  GLUCAP 199* 196* 173* 179* 145*   Lipid Profile: No results for input(s): "CHOL", "HDL", "LDLCALC", "TRIG", "CHOLHDL", "LDLDIRECT" in the last 72 hours. Thyroid Function Tests: No results for input(s): "TSH", "T4TOTAL", "FREET4", "T3FREE", "THYROIDAB" in the last 72 hours. Anemia Panel: No results for input(s): "VITAMINB12", "FOLATE", "FERRITIN", "TIBC", "IRON", "RETICCTPCT" in the last 72 hours. Sepsis Labs: No results for input(s): "PROCALCITON", "LATICACIDVEN" in the last 168 hours.  No results found for this or any previous visit (from the past 240 hour(s)).   Radiology Studies: CT HEAD WO CONTRAST (5MM)  Result Date: 06/05/2022 CLINICAL DATA:  Mental  status change with unknown cause EXAM: CT HEAD WITHOUT CONTRAST TECHNIQUE: Contiguous axial images were obtained from the base of the skull through the vertex without intravenous contrast. RADIATION DOSE REDUCTION: This exam was performed according to the departmental dose-optimization program which includes automated exposure control, adjustment of the mA and/or kV according to patient size and/or use of iterative reconstruction technique. COMPARISON:  None Available. FINDINGS: Brain: No evidence of acute infarction, hemorrhage, hydrocephalus, extra-axial collection or mass lesion/mass effect. Generalized cerebral volume loss. Acute or infarct in the left thalamus which has a discrete and chronic appearance on reformats. Vascular: No hyperdense vessel or unexpected calcification. Skull: Normal. Negative for fracture or focal lesion. Sinuses/Orbits: No acute finding. IMPRESSION: 1. No acute finding. 2. Cerebral volume loss and chronic left thalamic infarct. Electronically Signed   By: Jorje Guild M.D.   On: 06/05/2022 06:07    Scheduled Meds:  carvedilol  12.5 mg Oral BID WC   docusate sodium  100 mg Oral BID   enoxaparin (LOVENOX) injection  30 mg Subcutaneous Q24H   gabapentin  100  mg Oral QHS   insulin aspart  0-9 Units Subcutaneous TID WC   rosuvastatin  40 mg Oral Daily   sacubitril-valsartan  0.5 tablet Oral BID   Continuous Infusions:   LOS: 0 days    Time spent: 50 mins    Kwan Shellhammer, MD [facility]   If 7PM-7AM, please contact night-coverage

## 2022-06-06 NOTE — Progress Notes (Signed)
SLP Cancellation Note  Patient Details Name: [name] MRN: [id number] DOB: 1935-09-08   Cancelled treatment:       Reason Eval/Treat Not Completed: SLP screened, no needs identified, will sign off  SLP consult received and appreciated. Chart review completed. Pt upright in recliner. Pleasant and conversational. Spoke with pt's son and boyfriend. Both endorse pt is at cognitive-linguistic baseline during the day with significant sundowning type behaviors in the evening/overnight. Noted pt with hx of dementia. Son in agreement to defer cognitive-linguistic evaluation given pt's baseline. Son concerned for d/c plan and requesting to speak with TOC. RN made aware. RN messaged TOC. SLP to sign off.  Cherrie Gauze, M.S., [facility] [phone] ([place])  Quintella Baton 06/06/2022, 9:45 AM

## 2022-06-06 NOTE — Progress Notes (Signed)
Nutrition Brief Note  Patient identified on the Malnutrition Screening Tool (MST) Report  Wt Readings from Last 15 Encounters:  06/05/22 70 kg  03/09/22 69 kg  10/01/21 68 kg  03/19/16 61.1 kg  03/11/16 64.2 kg  03/15/15 66 kg  10/13/14 63.1 kg  09/28/14 61.9 kg  03/25/14 60.6 kg  03/25/14 60.3 kg  06/22/13 61.2 kg  05/17/13 63.3 kg  12/22/12 67.7 kg  11/17/12 64.9 kg  10/19/12 64.9 kg   Pt with medical history significant of HTN, HLD, dementia, and DM presenting with AMS.  Pt admitted with hypoglycemia.   Spoke with pt and significant other at bedside. Pt reports good appetite. Noted pt consumed 100% of breakfast tray this morning.   PTA, pt reports good appetite. Pt consumes 3 meals per day (Breakfast: eggs, potatoes, and fruit; Lunch: hot dog and fries; Dinner: meat, starch, and vegetable). Both pt and significant other cook at home and they also reports eating lunch out, where they try to get vegetables as a side.   Pt denies any weight loss, stating "I weigh the same as I always have". Reviewed wt hx; no wt loss noted over the past 8 months.   Medications reviewed and include colace.   Nutrition-Focused physical exam completed. Findings are no fat depletion, no muscle depletion, and no edema.    Lab Results  Component Value Date   HGBA1C 6.1 (H) 06/05/2022    PTA DM medications are 10 mg glipizide daily and 500 mg metformin BID.   Discussed home DM regimen with pt and significant other. Pt checks her blood sugar twice a day, and reading run between 120-130. DM is managed by PCP; pt takes 40 units novolog 70/30 BID. Both pt and significant other administer insulin to pt; both able to verbalize importance of rotating sites for insulin administration.   Labs reviewed: CBGS: 145-173 (inpatient orders for glycemic control are 0-9 units insulin aspart TID with meals).    Current diet order is carb modified, patient is consuming approximately 100% of meals at this time. Labs  and medications reviewed.   No nutrition interventions warranted at this time. If nutrition issues arise, please consult RD.   Loistine Chance, RD, LDN, [place] Registered Dietitian II Certified Diabetes Care and Education Specialist Please refer to [facility] for RD and/or RD on-call/weekend/after hours pager   ours pager

## 2022-06-06 NOTE — Progress Notes (Signed)
Occupational Therapy Treatment Patient Details Name: [name] MRN: [id number] DOB: 1936-01-01 Today's Date: 06/06/2022   History of present illness [name] is a 87 y.o. female history of diabetes, hypertension, hyperlipidemia, CHF, chronic kidney disease, anemia of chronic kidney disease, possible dementia who presents to the emergency department EMS for altered mental status and left-sided facial droop tonight.  EMS reports that she was not talking on their arrival but was awake.   OT comments  Upon entering the room, pt seated in recliner chair and agreeable to OT intervention with significant other present in room. Pt reports feeling much better and family in room reports he just assisted pt to bathroom prior to therapist arrival. Pt with food on hospital gown and she changes it with min A while seated. Pt stands with supervision and ambulates with min guard to sink with use of RW. Pt needing cuing for hand placement and to get inside of walker. Pt stands at sink for oral care, face washing, and combing hair with supervision - min guard for balance. Pt standing for ~ 8 minutes total. Pt then returns back to recliner chair at end of session with significant other remaining in room. Pt is making excellent progress towards goals with recommendations changed HHOT with supervision/assist at home for safety.    Recommendations for follow up therapy are one component of a multi-disciplinary discharge planning process, led by the attending physician.  Recommendations may be updated based on patient status, additional functional criteria and insurance authorization.    Follow Up Recommendations  Home health OT     Assistance Recommended at Discharge Frequent or constant Supervision/Assistance  Patient can return home with the following  A little help with walking and/or transfers;A little help with bathing/dressing/bathroom;Help with stairs or ramp for entrance;Assist for  transportation;Assistance with cooking/housework   Equipment Recommendations  Other (comment) (RW)       Precautions / Restrictions Precautions Precautions: Fall Restrictions Weight Bearing Restrictions: No       Mobility Bed Mobility               General bed mobility comments: Pt up in recliner    Transfers Overall transfer level: Needs assistance Equipment used: Rolling walker (2 wheels) Transfers: Sit to/from Stand Sit to Stand: Supervision                 Balance Overall balance assessment: Needs assistance Sitting-balance support: No upper extremity supported Sitting balance-Leahy Scale: Good     Standing balance support: Reliant on assistive device for balance, During functional activity, Bilateral upper extremity supported Standing balance-Leahy Scale: Fair                             ADL either performed or assessed with clinical judgement   ADL Overall ADL's : Needs assistance/impaired     Grooming: Wash/dry hands;Wash/dry face;Oral care;Standing;Min guard           Upper Body Dressing : Minimal assistance;Sitting Upper Body Dressing Details (indicate cue type and reason): to don hospital gown                        Extremity/Trunk Assessment Upper Extremity Assessment Upper Extremity Assessment: Generalized weakness   Lower Extremity Assessment Lower Extremity Assessment: Generalized weakness        Vision Patient Visual Report: No change from baseline  Cognition Arousal/Alertness: Awake/alert Behavior During Therapy: WFL for tasks assessed/performed Overall Cognitive Status: Impaired/Different from baseline                                                     Pertinent Vitals/ Pain       Pain Assessment Pain Assessment: No/denies pain         Frequency  Min 2X/week        Progress Toward Goals  OT Goals(current goals can now be found in the care plan  section)  Progress towards OT goals: Progressing toward goals  Acute Rehab OT Goals Patient Stated Goal: to get stronger OT Goal Formulation: With patient/family Time For Goal Achievement: 06/19/22 Potential to Achieve Goals: Good  Plan Frequency remains appropriate;Discharge plan needs to be updated       AM-PAC OT "6 Clicks" Daily Activity     Outcome Measure   Help from another person eating meals?: None Help from another person taking care of personal grooming?: A Little Help from another person toileting, which includes using toliet, bedpan, or urinal?: A Little Help from another person bathing (including washing, rinsing, drying)?: A Little Help from another person to put on and taking off regular upper body clothing?: A Little Help from another person to put on and taking off regular lower body clothing?: A Little 6 Click Score: 19    End of Session Equipment Utilized During Treatment: Rolling walker (2 wheels)  OT Visit Diagnosis: Unsteadiness on feet (R26.81);Repeated falls (R29.6);Muscle weakness (generalized) (M62.81)   Activity Tolerance Patient tolerated treatment well   Patient Left in chair;with call bell/phone within reach;with chair alarm set;with family/visitor present   Nurse Communication Mobility status        Time: [id number] OT Time Calculation (min): 17 min  Charges: OT General Charges $OT Visit: 1 Visit OT Treatments $Self Care/Home Management : 8-22 mins   Darleen Crocker, MS, OTR/L , CBIS ascom [phone]  06/06/22, 2:49 PM

## 2022-06-06 NOTE — TOC Progression Note (Signed)
Transition of Care [facility]) - Progression Note    Patient Details  Name: [name] MRN: [id number] Date of Birth: 1935/08/16  [facility]) CM/SW Contact  Gerilyn Pilgrim, LCSW Phone Number: 06/06/2022, 4:30 PM  Clinical Narrative:   SNF workup started. Waiting on MD to sign 30 day note as pt has triggered a level 2 passr.    Expected Discharge Plan: [place] Barriers to Discharge: Continued Medical Work up  Expected Discharge Plan and Services   Discharge Planning Services: CM Consult   Living arrangements for the past 2 months: Single Family Home                                       Social Determinants of Health (SDOH) Interventions SDOH Screenings   Food Insecurity: No Food Insecurity (06/05/2022)  Housing: Medium Risk (06/05/2022)  Transportation Needs: No Transportation Needs (06/05/2022)  Utilities: Not At Risk (06/05/2022)  Tobacco Use: Low Risk  (09/30/2019)    Readmission Risk Interventions     No data to display

## 2022-06-07 DIAGNOSIS — E11649 Type 2 diabetes mellitus with hypoglycemia without coma: Secondary | ICD-10-CM | POA: Diagnosis not present

## 2022-06-07 LAB — CBC
HCT: 31.3 % — ABNORMAL LOW (ref 36.0–46.0)
Hemoglobin: 10.3 g/dL — ABNORMAL LOW (ref 12.0–15.0)
MCH: 29.3 pg (ref 26.0–34.0)
MCHC: 32.9 g/dL (ref 30.0–36.0)
MCV: 88.9 fL (ref 80.0–100.0)
Platelets: 202 10*3/uL (ref 150–400)
RBC: 3.52 MIL/uL — ABNORMAL LOW (ref 3.87–5.11)
RDW: 14.8 % (ref 11.5–15.5)
WBC: 7.4 10*3/uL (ref 4.0–10.5)
nRBC: 0 % (ref 0.0–0.2)

## 2022-06-07 LAB — BASIC METABOLIC PANEL
Anion gap: 5 (ref 5–15)
BUN: 17 mg/dL (ref 8–23)
CO2: 24 mmol/L (ref 22–32)
Calcium: 9.5 mg/dL (ref 8.9–10.3)
Chloride: 108 mmol/L (ref 98–111)
Creatinine, Ser: 1.05 mg/dL — ABNORMAL HIGH (ref 0.44–1.00)
GFR, Estimated: 52 mL/min — ABNORMAL LOW (ref >60)
Glucose, Bld: 171 mg/dL — ABNORMAL HIGH (ref 70–99)
Potassium: 4.2 mmol/L (ref 3.5–5.1)
Sodium: 137 mmol/L (ref 135–145)

## 2022-06-07 LAB — GLUCOSE, CAPILLARY
Glucose-Capillary: 167 mg/dL — ABNORMAL HIGH (ref 70–99)
Glucose-Capillary: 198 mg/dL — ABNORMAL HIGH (ref 70–99)
Glucose-Capillary: 242 mg/dL — ABNORMAL HIGH (ref 70–99)
Glucose-Capillary: 188 mg/dL — ABNORMAL HIGH (ref 70–99)

## 2022-06-07 LAB — MAGNESIUM: Magnesium: 1.9 mg/dL (ref 1.7–2.4)

## 2022-06-07 LAB — PHOSPHORUS: Phosphorus: 3.3 mg/dL (ref 2.5–4.6)

## 2022-06-07 NOTE — Progress Notes (Signed)
Occupational Therapy Treatment Patient Details Name: [name] MRN: [id number] DOB: Feb 03, 1936 Today's Date: 06/07/2022   History of present illness [name] is a 87 y.o. female history of diabetes, hypertension, hyperlipidemia, CHF, chronic kidney disease, anemia of chronic kidney disease, possible dementia who presents to the emergency department EMS for altered mental status and left-sided facial droop tonight.  EMS reports that she was not talking on their arrival but was awake.   OT comments  Ms. Shimp demonstrates improvement today, is more alert and oriented than yesterday. She is able to transfer, ambulate with RW, perform toileting and grooming, all with SUPV-CGA, no LOB. Pt remains off her PLOF, however, and could benefit from a short-term stay at SNF to address strength, endurance, balance, and cognition. How updated DC recs to SNF.   Recommendations for follow up therapy are one component of a multi-disciplinary discharge planning process, led by the attending physician.  Recommendations may be updated based on patient status, additional functional criteria and insurance authorization.    Follow Up Recommendations  Skilled nursing-short term rehab (<3 hours/day)     Assistance Recommended at Discharge Frequent or constant Supervision/Assistance  Patient can return home with the following  A little help with walking and/or transfers;A little help with bathing/dressing/bathroom;Help with stairs or ramp for entrance;Assist for transportation;Assistance with cooking/housework   Equipment Recommendations       Recommendations for Other Services      Precautions / Restrictions Precautions Precautions: Fall Restrictions Weight Bearing Restrictions: No       Mobility Bed Mobility               General bed mobility comments: Pt up in recliner    Transfers Overall transfer level: Needs assistance Equipment used: Rolling walker (2 wheels) Transfers: Sit  to/from Stand Sit to Stand: Supervision Stand pivot transfers: Supervision               Balance Overall balance assessment: Needs assistance Sitting-balance support: No upper extremity supported Sitting balance-Leahy Scale: Good     Standing balance support: Reliant on assistive device for balance, During functional activity, Bilateral upper extremity supported Standing balance-Leahy Scale: Fair Standing balance comment: steady with functional UE task with RW or counter in front of pt; intermittent UE support needed.                           ADL either performed or assessed with clinical judgement   ADL Overall ADL's : Needs assistance/impaired     Grooming: Wash/dry hands;Oral care;Standing;Supervision/safety                   Toilet Transfer: Min guard;Grab bars;Rolling walker (2 wheels);Regular Toilet   Toileting- Clothing Manipulation and Hygiene: Supervision/safety;Sit to/from stand              Extremity/Trunk Assessment Upper Extremity Assessment Upper Extremity Assessment: Generalized weakness   Lower Extremity Assessment Lower Extremity Assessment: Generalized weakness        Vision       Perception     Praxis      Cognition Arousal/Alertness: Awake/alert Behavior During Therapy: WFL for tasks assessed/performed Overall Cognitive Status: Impaired/Different from baseline                         Following Commands: Follows one step commands consistently       General Comments: Improved alertness and orientation today  Exercises Other Exercises Other Exercises: Educ re: falls prevention, DC recs, home safety and set-up    Shoulder Instructions       General Comments      Pertinent Vitals/ Pain       Pain Assessment Pain Assessment: No/denies pain  Home Living                                          Prior Functioning/Environment              Frequency  Min 2X/week         Progress Toward Goals  OT Goals(current goals can now be found in the care plan section)  Progress towards OT goals: Progressing toward goals  Acute Rehab OT Goals OT Goal Formulation: With patient/family Time For Goal Achievement: 06/19/22 Potential to Achieve Goals: Good  Plan Discharge plan needs to be updated;Discharge plan remains appropriate    Co-evaluation                 AM-PAC OT "6 Clicks" Daily Activity     Outcome Measure   Help from another person eating meals?: None Help from another person taking care of personal grooming?: A Little Help from another person toileting, which includes using toliet, bedpan, or urinal?: A Little Help from another person bathing (including washing, rinsing, drying)?: A Little Help from another person to put on and taking off regular upper body clothing?: A Little Help from another person to put on and taking off regular lower body clothing?: A Little 6 Click Score: 19    End of Session Equipment Utilized During Treatment: Rolling walker (2 wheels)  OT Visit Diagnosis: Unsteadiness on feet (R26.81);Repeated falls (R29.6);Muscle weakness (generalized) (M62.81)   Activity Tolerance Patient tolerated treatment well   Patient Left in chair;with call bell/phone within reach;with family/visitor present   Nurse Communication          Time: [id number] OT Time Calculation (min): 13 min  Charges: OT General Charges $OT Visit: 1 Visit OT Treatments $Self Care/Home Management : 8-22 mins Josiah Lobo, PhD, MS, OTR/L 06/07/22, 12:48 PM

## 2022-06-07 NOTE — TOC Progression Note (Signed)
Transition of Care [facility]) - Progression Note    Patient Details  Name: [name] MRN: [id number] Date of Birth: [birth year]  Transition of Care [facility]) CM/SW Contact  Gerilyn Pilgrim, LCSW Phone Number: 06/07/2022, 8:09 AM  Clinical Narrative:   30 day note signed today by MD. CSW uploaded to [place]. Level 2 passr still pending.     Expected Discharge Plan: [place] Barriers to Discharge: Continued Medical Work up  Expected Discharge Plan and Services   Discharge Planning Services: CM Consult   Living arrangements for the past 2 months: Single Family Home                                       Social Determinants of Health (SDOH) Interventions SDOH Screenings   Food Insecurity: No Food Insecurity (06/05/2022)  Housing: Medium Risk (06/05/2022)  Transportation Needs: No Transportation Needs (06/05/2022)  Utilities: Not At Risk (06/05/2022)  Tobacco Use: Low Risk  (09/30/2019)    Readmission Risk Interventions     No data to display

## 2022-06-07 NOTE — Progress Notes (Signed)
PROGRESS NOTE    [name]  [id number] DOB: Nov 01, 1935 DOA: 06/05/2022 PCP: Allyne Gee, MD   Brief Narrative: This 87 years old female with PMH significant for hypertension, hyperlipidemia, dementia and diabetes presented with altered mental status.  Son reports that at about 3:45 AM his sister called.  She went to the living room and has noticed the change in her behavior.  They put her back into the bed and they rechecked she was still hallucinating.  They called 911 and was brought in the ED.  She was found to have blood sugar of 60, she was given glucagon.  Glucose went up and then went back down.  She seems back to her baseline mental status but still feeling weak and tired.  At home patient was taking metformin and glipizide which was changed to 70/ 30 twice daily.  Patient is admitted for hypoglycemia and started on D10W infusion.  Assessment & Plan:   Principal Problem:   Hypoglycemia associated with diabetes ([place]) Active Problems:   Hyperlipidemia   Essential hypertension   Chronic systolic congestive heart failure (HCC)   Dementia with behavioral disturbance (HCC)   DNR (do not resuscitate)  Acute metabolic encephalopathy due to hypoglycemia: Hypoglycemia with history of diabetes mellitus: Patient was previously on oral diabetic medications but which were recently transitioned to  humolog 70/ 30 ( 40 units twice daily).  Last hemoglobin A1c 6.1.  Patient started on D10W infusion. Continue with regular insulin sliding scale.  Hold long-acting insulin at this time.   Diabetic coordinator consulted. Hypoglycemia improved.  Discontinue D10W infusion. She is back to her baseline mental status.  Dementia: Patient has had progressive cognitive impairment. Son is concerned that she needs close monitoring and he is considering moving her with him. Monitor delirium precautions. PT and OT recommended skilled nursing facility.  TOC notified.  Essential  hypertension: Continue Coreg and Entresto  Hyperlipidemia Continue Crestor, hold Zetia , fish oil while inpatient  Chronic systolic CHF: In 0000000 echo shows LVEF 10%. Continue carvedilol and Entresto.   She appears compensated at this time.   DVT prophylaxis: Lovenox Code Status: DNR Family Communication: No family at bed side. Disposition Plan:    Status is: Observation The patient remains OBS appropriate and will d/c before 2 midnights.  Admitted for altered mental status sec.to hypoglycemia  Patient is recommended SNF.   Consultants:  None  Procedures: None  Antimicrobials:  Anti-infectives (From admission, onward)    None       Subjective: Patient is seen and examined at bedside.  Overnight events noted.   Patient was sitting comfortably on the chair,  having conversation with her friend in the room. She is back to her baseline mental status.  Son is at bedside.   Objective: Vitals:   06/06/22 1539 06/06/22 2135 06/07/22 0634 06/07/22 0722  BP: (!) 141/51 (!) 144/58 (!) 143/60 (!) 148/53  Pulse: 87 85 86 78  Resp: 15 18 18 18  $ Temp: 98.4 F (36.9 C) 98.5 F (36.9 C) 98.7 F (37.1 C) 97.7 F (36.5 C)  TempSrc:      SpO2: 96% 99% 98% 96%  Weight:      Height:        Intake/Output Summary (Last 24 hours) at 06/07/2022 1253 Last data filed at 06/06/2022 1900 Gross per 24 hour  Intake 480 ml  Output --  Net 480 ml   Filed Weights   06/05/22 0453  Weight: 70 kg  Examination:  General exam: Appears comfortable, deconditioned, not in any distress. Respiratory system: CTA bilaterally. Respiratory effort normal.  RR 13 Cardiovascular system: S1-S2 heard, regular rate and rhythm, no murmur. Gastrointestinal system: Abdomen is soft,  non tender, non distended, BS+ Central nervous system: Alert and oriented x 1. No focal neurological deficits. Extremities: No edema, no cyanosis, no clubbing Skin: No rashes, lesions or ulcers Psychiatry: Mood &  affect appropriate.     Data Reviewed: I have personally reviewed following labs and imaging studies  CBC: Recent Labs  Lab 06/05/22 0500 06/06/22 0422 06/07/22 0711  WBC 8.3 7.5 7.4  NEUTROABS 5.0  --   --   HGB 11.1* 9.9* 10.3*  HCT 34.9* 30.7* 31.3*  MCV 90.9 89.5 88.9  PLT 217 213 [id number]   Basic Metabolic Panel: Recent Labs  Lab 06/05/22 0500 06/06/22 0422 06/07/22 0711  NA 138 138 137  K 3.6 3.8 4.2  CL 104 106 108  CO2 24 23 24  $ GLUCOSE 85 155* 171*  BUN 22 17 17  $ CREATININE 1.26* 1.24* 1.05*  CALCIUM 9.7 9.2 9.5  MG  --   --  1.9  PHOS  --   --  3.3   GFR: Estimated Creatinine Clearance: 35.7 mL/min (A) (by C-G formula based on SCr of 1.05 mg/dL (H)). Liver Function Tests: Recent Labs  Lab 06/05/22 0500  AST 32  ALT 24  ALKPHOS 45  BILITOT 0.6  PROT 7.7  ALBUMIN 3.7   No results for input(s): "LIPASE", "AMYLASE" in the last 168 hours. No results for input(s): "AMMONIA" in the last 168 hours. Coagulation Profile: No results for input(s): "INR", "PROTIME" in the last 168 hours. Cardiac Enzymes: No results for input(s): "CKTOTAL", "CKMB", "CKMBINDEX", "TROPONINI" in the last 168 hours. BNP (last 3 results) No results for input(s): "PROBNP" in the last 8760 hours. HbA1C: Recent Labs    06/05/22 0500  HGBA1C 6.1*   CBG: Recent Labs  Lab 06/06/22 1144 06/06/22 1612 06/06/22 2138 06/07/22 0727 06/07/22 1119  GLUCAP 170* 181* 162* 167* 198*   Lipid Profile: No results for input(s): "CHOL", "HDL", "LDLCALC", "TRIG", "CHOLHDL", "LDLDIRECT" in the last 72 hours. Thyroid Function Tests: No results for input(s): "TSH", "T4TOTAL", "FREET4", "T3FREE", "THYROIDAB" in the last 72 hours. Anemia Panel: No results for input(s): "VITAMINB12", "FOLATE", "FERRITIN", "TIBC", "IRON", "RETICCTPCT" in the last 72 hours. Sepsis Labs: No results for input(s): "PROCALCITON", "LATICACIDVEN" in the last 168 hours.  No results found for this or any previous visit  (from the past 240 hour(s)).   Radiology Studies: No results found.  Scheduled Meds:  carvedilol  12.5 mg Oral BID WC   enoxaparin (LOVENOX) injection  40 mg Subcutaneous Q24H   gabapentin  100 mg Oral QHS   insulin aspart  0-9 Units Subcutaneous TID WC   rosuvastatin  40 mg Oral Daily   sacubitril-valsartan  0.5 tablet Oral BID   Continuous Infusions:   LOS: 0 days    Time spent: 35 mins    Ademide Schaberg, MD [facility]   If 7PM-7AM, please contact night-coverage

## 2022-06-07 NOTE — TOC Progression Note (Signed)
Transition of Care [facility]) - Progression Note    Patient Details  Name: [name] MRN: [id number] Date of Birth: 1935-09-06  Transition of Care [facility]) CM/SW Contact  Gerilyn Pilgrim, LCSW Phone Number: 06/07/2022, 2:07 PM  Clinical Narrative:  [facility] in [place] able to accept. CSW called to share with son but unable to reach and unable to leave a VM.      Expected Discharge Plan: [place] Barriers to Discharge: Continued Medical Work up  Expected Discharge Plan and Services   Discharge Planning Services: CM Consult   Living arrangements for the past 2 months: Single Family Home                                       Social Determinants of Health (SDOH) Interventions SDOH Screenings   Food Insecurity: No Food Insecurity (06/05/2022)  Housing: Medium Risk (06/05/2022)  Transportation Needs: No Transportation Needs (06/05/2022)  Utilities: Not At Risk (06/05/2022)  Tobacco Use: Low Risk  (09/30/2019)    Readmission Risk Interventions     No data to display

## 2022-06-07 NOTE — Plan of Care (Signed)

## 2022-06-07 NOTE — Progress Notes (Signed)
Mobility Specialist - Progress Note    06/07/22 1400  Mobility  Activity Ambulated with assistance in room;Ambulated with assistance to bathroom  Level of Assistance Contact guard assist, steadying assist  Assistive Device Front wheel walker  Distance Ambulated (ft) 10 ft  Range of Motion/Exercises Active  Activity Response Tolerated well  Mobility Referral Yes  $Mobility charge 1 Mobility   Pt resting in bed on RA upon entry. Pt STS and ambulates to bathroom CGA with AD. Pt returned to bed to perform hygiene and gown change. Pt left in bed with needs in reach and bed alarm activated.   Loma Sender Mobility Specialist 06/07/22, 3:02 PM

## 2022-06-07 NOTE — TOC Progression Note (Signed)
Transition of Care [facility]) - Progression Note    Patient Details  Name: [name] MRN: [id number] Date of Birth: [birth year]  Transition [facility] Care [facility]) CM/SW Contact  Gerilyn Pilgrim, LCSW Phone Number: 06/07/2022, 2:42 PM  Clinical Narrative:   CSW spoke with son who reports he does not want pt to discharge home and feels if we can get her sugars managed that she can go back to her home. CSW shared this with MD.    Expected Discharge Plan: [place] Barriers to Discharge: Continued Medical Work up  Expected Discharge Plan and Services   Discharge Planning Services: CM Consult   Living arrangements for the past 2 months: Single Family Home                                       Social Determinants of Health (SDOH) Interventions SDOH Screenings   Food Insecurity: No Food Insecurity (06/05/2022)  Housing: Medium Risk (06/05/2022)  Transportation Needs: No Transportation Needs (06/05/2022)  Utilities: Not At Risk (06/05/2022)  Tobacco Use: Low Risk  (09/30/2019)    Readmission Risk Interventions     No data to display

## 2022-06-08 ENCOUNTER — Encounter: Payer: Self-pay | Admitting: Family Medicine

## 2022-06-08 DIAGNOSIS — E11649 Type 2 diabetes mellitus with hypoglycemia without coma: Secondary | ICD-10-CM | POA: Diagnosis not present

## 2022-06-08 LAB — GLUCOSE, CAPILLARY
Glucose-Capillary: 188 mg/dL — ABNORMAL HIGH (ref 70–99)
Glucose-Capillary: 261 mg/dL — ABNORMAL HIGH (ref 70–99)

## 2022-06-08 MED ORDER — GLIPIZIDE 2.5 MG PO TABS: 2.5000 mg | ORAL_TABLET | Freq: Every day | ORAL | 0 refills | Status: DC

## 2022-06-08 MED ORDER — GLIPIZIDE 5 MG PO TABS: 2.5000 mg | ORAL_TABLET | Freq: Every day | ORAL | Status: DC

## 2022-06-08 NOTE — TOC Transition Note (Addendum)
Transition of Care [facility]) - CM/SW Discharge Note   Patient Details  Name: KAVON WEAKS MRN: [id number] Date of Birth: 06-Jun-1935  Transition of Care [facility]) CM/SW Contact:  Tiburcio Bash, LCSW Phone Number: 06/08/2022, 11:02 AM   Clinical Narrative:     Patient to discharge home today with son , agreeable to [facility] services of PT and OT, referral given to [facility] with [facility] (accepted). No further discharge planning needs identified.   Final next level of care: Home w Home Health Services Barriers to Discharge: No Barriers Identified   Patient Goals and CMS Choice CMS Medicare.gov Compare Post Acute Care list provided to:: Patient Choice offered to / list presented to : Patient  Discharge Placement                         Discharge Plan and Services Additional resources added to the After Visit Summary for     Discharge Planning Services: CM Consult                                 Social Determinants of Health (SDOH) Interventions SDOH Screenings   Food Insecurity: No Food Insecurity (06/05/2022)  Housing: Medium Risk (06/05/2022)  Transportation Needs: No Transportation Needs (06/05/2022)  Utilities: Not At Risk (06/05/2022)  Tobacco Use: Low Risk  (06/08/2022)     Readmission Risk Interventions     No data to display

## 2022-06-08 NOTE — Discharge Summary (Signed)
Physician Discharge Summary  [name] [id number] DOB: 24-Nov-1935 DOA: 06/05/2022  PCP: Allyne Gee, MD  Admit date: 06/05/2022  Discharge date: 06/08/2022  Admitted From: Home  Disposition:  [place]  Recommendations for Outpatient Follow-up:  Follow up with PCP in 1-2 weeks. Please obtain BMP/CBC in one week. Advised to discontinue insulin. Start glipizide 2.5 mg daily before breakfast. Continue to monitor blood sugars every 8 hours. Advised to follow-up with Endocrinology as scheduled.  Appointment has been made. Follow-up with cardiology as scheduled.  Home Health: [facility] PT/OT Equipment/Devices: None  Discharge Condition: Stable CODE STATUS: DNR Diet recommendation: Heart Healthy    Brief Summary/ Hospital Course: This 87 years old female with PMH significant for hypertension, hyperlipidemia, dementia and diabetes presented in the ED with altered mental status. Son reports that at about 3:45 AM his sister called. She went to the living room and has noticed the change in her behavior. They put her back into the bed and they rechecked she was still hallucinating. They called 911 and was brought in the ED. She was found to have blood sugar of 60, She was given glucagon. Glucose went up and then went back down. She seems back to her baseline mental status but still feeling weak and tired. At home patient was taking metformin and glipizide which was changed to Humalog 70/ 30 twice daily.  Patient was admitted for acute metabolic encephalopathy secondary to hypoglycemia and started on D10W infusion.  Blood sugar has improved.  She is back to her baseline mental status.  Hemoglobin A1c 6.1 very well-controlled diabetes.  Insulin was discontinued.  Patient was started on glipizide 2.5 mg daily.  Advised to follow-up with primary care physician. Home health services arranged.  Patient is being discharged home.  Discharge Diagnoses:  Principal Problem:   Hypoglycemia  associated with diabetes ([place]) Active Problems:   Hyperlipidemia   Essential hypertension   Chronic systolic congestive heart failure (HCC)   Dementia with behavioral disturbance (HCC)   DNR (do not resuscitate)  Acute metabolic encephalopathy due to hypoglycemia: Hypoglycemia with history of diabetes mellitus: Patient was previously on oral diabetic medications but which were recently transitioned to  humolog 70/ 30 ( 40 units twice daily). hemoglobin A1c 6.1.  Patient started on D10W infusion. Continue with regular insulin sliding scale.  Hold long-acting insulin at this time.   Diabetic coordinator consulted. Hypoglycemia improved.  Discontinue D10W infusion. She is back to her baseline mental status. Insulin discontinued.  Patient is started on glipizide 2.5 mg daily.   Dementia: Patient has had progressive cognitive impairment. Son is concerned that she needs close monitoring and he is considering moving her with him. Monitor delirium precautions. PT and OT recommended skilled nursing facility.  TOC notified. Patient has 24/7 care.  Family wanted patient to be discharged home. Home health services arranged.   Essential hypertension: Continue Coreg and Entresto   Hyperlipidemia Continue Crestor, hold Zetia , fish oil while inpatient   Chronic systolic CHF: In 0000000 echo shows LVEF 10%. Continue carvedilol and Entresto.   She appears compensated at this time.    Discharge Instructions  Discharge Instructions     Call MD for:  difficulty breathing, headache or visual disturbances   Complete by: As directed    Call MD for:  persistant dizziness or light-headedness   Complete by: As directed    Call MD for:  persistant nausea and vomiting   Complete by: As directed    Diet -  low sodium heart healthy   Complete by: As directed    Diet Carb Modified   Complete by: As directed    Discharge instructions   Complete by: As directed    Advised to follow-up with primary  care physician in 1 week. Advised to discontinue insulin. Restart glipizide 2.5 mg daily before breakfast. Continue to monitor blood sugars every 8 hours. Advised to follow-up with endocrinology as scheduled.  Appointment has been made.   Increase activity slowly   Complete by: As directed       Allergies as of 06/08/2022       Reactions   Acetaminophen    REACTION: nausea   Codeine    REACTION: itching   Fluticasone Propionate    REACTION: palpitations   Fosamax [alendronate Sodium]    Difficulty swallowing and fever/ joint pain    Lidocaine Nausea And Vomiting   Increases heart rate, N/V per pt   Naproxen    REACTION: GI   Nsaids    REACTION: elevated LFT's   Penicillins    REACTION: swelling at inj site        Medication List     STOP taking these medications    insulin aspart protamine- aspart (70-30) 100 UNIT/ML injection Commonly known as: NOVOLOG MIX 70/30       TAKE these medications    baclofen 10 MG tablet Commonly known as: LIORESAL Take 5 mg by mouth at bedtime as needed for muscle spasms.   Calcium 600+D 600-400 MG-UNIT tablet Generic drug: Calcium Carbonate-Vitamin D Take 2 tablets by mouth daily.   carvedilol 12.5 MG tablet Commonly known as: COREG Take 1 tablet (12.5 mg total) by mouth 2 (two) times daily with a meal.   Entresto 24-26 MG Generic drug: sacubitril-valsartan TAKE 1/2 (HALF) TABLET TWICE DAILY.   ezetimibe 10 MG tablet Commonly known as: ZETIA TAKE 1 TABLET BY MOUTH EVERY DAY   fish oil-omega-3 fatty acids 1000 MG capsule Take 1 capsule by mouth 2 (two) times daily.   gabapentin 100 MG capsule Commonly known as: NEURONTIN Take 100 mg by mouth at bedtime.   glipiZIDE 2.5 MG Tabs Take 2.5 mg by mouth daily before breakfast. Start taking on: June 09, 2022   multivitamin tablet Take 1 tablet by mouth daily.   nitroGLYCERIN 0.4 MG SL tablet Commonly known as: NITROSTAT Place 1 tablet (0.4 mg total) under the  tongue every 5 (five) minutes as needed for chest pain.   OneTouch Verio Flex System w/Device Kit 1 Device by Other route daily. Check blood sugar once daily and as directed. Dx E11.9   rosuvastatin 40 MG tablet Commonly known as: CRESTOR Take 40 mg by mouth daily.   Vitamin D (Ergocalciferol) 1.25 MG (50000 UNIT) Caps capsule Commonly known as: DRISDOL Take 50,000 Units by mouth every Monday.        Follow-up Information     Allyne Gee, MD Follow up in 1 week(s).   Specialties: Internal Medicine, Pulmonary Disease Contact information: [place] [address] [phone]                Allergies  Allergen Reactions   Acetaminophen     REACTION: nausea   Codeine     REACTION: itching   Fluticasone Propionate     REACTION: palpitations   Fosamax [Alendronate Sodium]     Difficulty swallowing and fever/ joint pain    Lidocaine Nausea And Vomiting    Increases heart rate, N/V per  pt   Naproxen     REACTION: GI   Nsaids     REACTION: elevated LFT's   Penicillins     REACTION: swelling at inj site    Consultations: None   Procedures/Studies: CT HEAD WO CONTRAST (5MM)  Result Date: 06/05/2022 CLINICAL DATA:  Mental status change with unknown cause EXAM: CT HEAD WITHOUT CONTRAST TECHNIQUE: Contiguous axial images were obtained from the base of the skull through the vertex without intravenous contrast. RADIATION DOSE REDUCTION: This exam was performed according to the departmental dose-optimization program which includes automated exposure control, adjustment of the mA and/or kV according to patient size and/or use of iterative reconstruction technique. COMPARISON:  None Available. FINDINGS: Brain: No evidence of acute infarction, hemorrhage, hydrocephalus, extra-axial collection or mass lesion/mass effect. Generalized cerebral volume loss. Acute or infarct in the left thalamus which has a discrete and chronic appearance on reformats. Vascular: No  hyperdense vessel or unexpected calcification. Skull: Normal. Negative for fracture or focal lesion. Sinuses/Orbits: No acute finding. IMPRESSION: 1. No acute finding. 2. Cerebral volume loss and chronic left thalamic infarct. Electronically Signed   By: Jorje Guild M.D.   On: 06/05/2022 06:07    Subjective: Patient was seen and examined at bedside.  Overnight events noted.   Patient reports doing much better and wants to be discharged.   Home health services arranged.  Patient is being discharged home.  Discharge Exam: Vitals:   06/08/22 0542 06/08/22 0734  BP: (!) 143/62 (!) 154/69  Pulse: 84 86  Resp: 16 16  Temp: 98.7 F (37.1 C) 98.4 F (36.9 C)  SpO2: 96% 97%   Vitals:   06/07/22 1721 06/07/22 2014 06/08/22 0542 06/08/22 0734  BP: (!) 144/53 (!) 143/68 (!) 143/62 (!) 154/69  Pulse: 86 84 84 86  Resp: 18 18 16 16  $ Temp: 99 F (37.2 C) 98.3 F (36.8 C) 98.7 F (37.1 C) 98.4 F (36.9 C)  TempSrc:  Oral Oral   SpO2: 97% 95% 96% 97%  Weight:      Height:        General: Pt is alert, awake, not in acute distress Cardiovascular: RRR, S1/S2 +, no rubs, no gallops Respiratory: CTA bilaterally, no wheezing, no rhonchi Abdominal: Soft, NT, ND, bowel sounds + Extremities: no edema, no cyanosis    The results of significant diagnostics from this hospitalization (including imaging, microbiology, ancillary and laboratory) are listed below for reference.     Microbiology: No results found for this or any previous visit (from the past 240 hour(s)).   Labs: BNP (last 3 results) No results for input(s): "BNP" in the last 8760 hours. Basic Metabolic Panel: Recent Labs  Lab 06/05/22 0500 06/06/22 0422 06/07/22 0711  NA 138 138 137  K 3.6 3.8 4.2  CL 104 106 108  CO2 24 23 24  $ GLUCOSE 85 155* 171*  BUN 22 17 17  $ CREATININE 1.26* 1.24* 1.05*  CALCIUM 9.7 9.2 9.5  MG  --   --  1.9  PHOS  --   --  3.3   Liver Function Tests: Recent Labs  Lab 06/05/22 0500  AST  32  ALT 24  ALKPHOS 45  BILITOT 0.6  PROT 7.7  ALBUMIN 3.7   No results for input(s): "LIPASE", "AMYLASE" in the last 168 hours. No results for input(s): "AMMONIA" in the last 168 hours. CBC: Recent Labs  Lab 06/05/22 0500 06/06/22 0422 06/07/22 0711  WBC 8.3 7.5 7.4  NEUTROABS 5.0  --   --  HGB 11.1* 9.9* 10.3*  HCT 34.9* 30.7* 31.3*  MCV 90.9 89.5 88.9  PLT 217 213 202   Cardiac Enzymes: No results for input(s): "CKTOTAL", "CKMB", "CKMBINDEX", "TROPONINI" in the last 168 hours. BNP: Invalid input(s): "POCBNP" CBG: Recent Labs  Lab 06/07/22 1119 06/07/22 1718 06/07/22 2017 06/08/22 0735 06/08/22 1233  GLUCAP 198* 242* 188* 188* 261*   D-Dimer No results for input(s): "DDIMER" in the last 72 hours. Hgb A1c No results for input(s): "HGBA1C" in the last 72 hours. Lipid Profile No results for input(s): "CHOL", "HDL", "LDLCALC", "TRIG", "CHOLHDL", "LDLDIRECT" in the last 72 hours. Thyroid function studies No results for input(s): "TSH", "T4TOTAL", "T3FREE", "THYROIDAB" in the last 72 hours.  Invalid input(s): "FREET3" Anemia work up No results for input(s): "VITAMINB12", "FOLATE", "FERRITIN", "TIBC", "IRON", "RETICCTPCT" in the last 72 hours. Urinalysis    Component Value Date/Time   COLORURINE STRAW (A) 06/05/2022 0500   APPEARANCEUR CLEAR (A) 06/05/2022 0500   LABSPEC 1.008 06/05/2022 0500   PHURINE 5.0 06/05/2022 0500   GLUCOSEU NEGATIVE 06/05/2022 0500   HGBUR NEGATIVE 06/05/2022 0500   BILIRUBINUR NEGATIVE 06/05/2022 0500   BILIRUBINUR neg 10/13/2014 0822   KETONESUR NEGATIVE 06/05/2022 0500   PROTEINUR NEGATIVE 06/05/2022 0500   UROBILINOGEN negative 10/13/2014 0822   NITRITE NEGATIVE 06/05/2022 0500   LEUKOCYTESUR NEGATIVE 06/05/2022 0500   Sepsis Labs Recent Labs  Lab 06/05/22 0500 06/06/22 0422 06/07/22 0711  WBC 8.3 7.5 7.4   Microbiology No results found for this or any previous visit (from the past 240 hour(s)).   Time coordinating  discharge: Over 30 minutes  SIGNED:   Shawna Clamp, MD  [facility] 06/08/2022, 2:19 PM Pager   If 7PM-7AM, please contact night-coverage

## 2022-06-10 DIAGNOSIS — J45909 Unspecified asthma, uncomplicated: Secondary | ICD-10-CM | POA: Diagnosis not present

## 2022-06-10 DIAGNOSIS — M19042 Primary osteoarthritis, left hand: Secondary | ICD-10-CM | POA: Diagnosis not present

## 2022-06-10 DIAGNOSIS — M419 Scoliosis, unspecified: Secondary | ICD-10-CM | POA: Diagnosis not present

## 2022-06-10 DIAGNOSIS — M19041 Primary osteoarthritis, right hand: Secondary | ICD-10-CM | POA: Diagnosis not present

## 2022-06-10 DIAGNOSIS — I5022 Chronic systolic (congestive) heart failure: Secondary | ICD-10-CM | POA: Diagnosis not present

## 2022-06-10 DIAGNOSIS — E11649 Type 2 diabetes mellitus with hypoglycemia without coma: Secondary | ICD-10-CM | POA: Diagnosis not present

## 2022-06-10 DIAGNOSIS — I110 Hypertensive heart disease with heart failure: Secondary | ICD-10-CM | POA: Diagnosis not present

## 2022-06-10 DIAGNOSIS — M19029 Primary osteoarthritis, unspecified elbow: Secondary | ICD-10-CM | POA: Diagnosis not present

## 2022-06-10 DIAGNOSIS — F03918 Unspecified dementia, unspecified severity, with other behavioral disturbance: Secondary | ICD-10-CM | POA: Diagnosis not present

## 2022-06-11 DIAGNOSIS — M19042 Primary osteoarthritis, left hand: Secondary | ICD-10-CM | POA: Diagnosis not present

## 2022-06-11 DIAGNOSIS — I5022 Chronic systolic (congestive) heart failure: Secondary | ICD-10-CM | POA: Diagnosis not present

## 2022-06-11 DIAGNOSIS — M419 Scoliosis, unspecified: Secondary | ICD-10-CM | POA: Diagnosis not present

## 2022-06-11 DIAGNOSIS — M19041 Primary osteoarthritis, right hand: Secondary | ICD-10-CM | POA: Diagnosis not present

## 2022-06-11 DIAGNOSIS — M19029 Primary osteoarthritis, unspecified elbow: Secondary | ICD-10-CM | POA: Diagnosis not present

## 2022-06-11 DIAGNOSIS — F03918 Unspecified dementia, unspecified severity, with other behavioral disturbance: Secondary | ICD-10-CM | POA: Diagnosis not present

## 2022-06-11 DIAGNOSIS — I110 Hypertensive heart disease with heart failure: Secondary | ICD-10-CM | POA: Diagnosis not present

## 2022-06-11 DIAGNOSIS — J45909 Unspecified asthma, uncomplicated: Secondary | ICD-10-CM | POA: Diagnosis not present

## 2022-06-11 DIAGNOSIS — E11649 Type 2 diabetes mellitus with hypoglycemia without coma: Secondary | ICD-10-CM | POA: Diagnosis not present

## 2022-06-13 ENCOUNTER — Encounter: Payer: Self-pay | Admitting: Internal Medicine

## 2022-06-13 ENCOUNTER — Ambulatory Visit (INDEPENDENT_AMBULATORY_CARE_PROVIDER_SITE_OTHER): Payer: Medicare Other | Admitting: Internal Medicine

## 2022-06-13 VITALS — BP 134/70 | HR 95 | Ht 60.0 in | Wt 163.6 lb

## 2022-06-13 DIAGNOSIS — I10 Essential (primary) hypertension: Secondary | ICD-10-CM

## 2022-06-13 DIAGNOSIS — F02B0 Dementia in other diseases classified elsewhere, moderate, without behavioral disturbance, psychotic disturbance, mood disturbance, and anxiety: Secondary | ICD-10-CM | POA: Diagnosis not present

## 2022-06-13 DIAGNOSIS — E782 Mixed hyperlipidemia: Secondary | ICD-10-CM | POA: Diagnosis not present

## 2022-06-13 DIAGNOSIS — Z794 Long term (current) use of insulin: Secondary | ICD-10-CM | POA: Diagnosis not present

## 2022-06-13 DIAGNOSIS — G301 Alzheimer's disease with late onset: Secondary | ICD-10-CM | POA: Insufficient documentation

## 2022-06-13 DIAGNOSIS — E118 Type 2 diabetes mellitus with unspecified complications: Secondary | ICD-10-CM | POA: Diagnosis not present

## 2022-06-13 DIAGNOSIS — E1165 Type 2 diabetes mellitus with hyperglycemia: Secondary | ICD-10-CM

## 2022-06-13 DIAGNOSIS — F02B11 Dementia in other diseases classified elsewhere, moderate, with agitation: Secondary | ICD-10-CM | POA: Insufficient documentation

## 2022-06-13 LAB — POCT CBG (FASTING - GLUCOSE)-MANUAL ENTRY: Glucose Fasting, POC: 351 mg/dL — AB (ref 70–99)

## 2022-06-13 NOTE — Progress Notes (Signed)
Established Patient Office Visit  Subjective:  Patient ID: [name], female    DOB: 12/30/1935  Age: 87 y.o. MRN: [id number]  Chief Complaint  Patient presents with   Follow-up    2 month follow up    Patient comes in for hospital follow-up, accompanied by her son and friend.  She was recently taken to the emergency room with reports of getting confused and hallucinating after waking up from a bad dream.  Patient was showing altered mental status  so 911 was called and at that time her sugar was also found to be in 60 s.   Patient was given Glucagon . In the emergency room it was considered that the patient has suffered from hypoglycemia.  Her insulin was stopped and she was kept on glipizide 2.5 mg twice daily.  Her last hemoglobin A1c was 8.7 in November 2023 and at that time her insulin dose was increased.  The repeat hemoglobin A1c done now is 6.1 which has shown a significant improvement.   After discharge from the hospital without any insulin her sugars are running in upper 300s.  Patient reports that she is feeling very tired and exhausted as her blood glucose is staying above 300. At this time it was discussed and decided that the patient should be reintroduced back to insulin but at a smaller dose.  She will be started back on she NovoLog Mix 70/30 at 10 units twice a day with close monitoring of her fingerstick glucose may gradually need to increase the dose. We have dementia and is known to have bad dreams at night as per her son.  He is concerned that her dementia is getting worse.  They are also set up with home health services.  And she is started on PT and OT at home.     Past Medical History:  Diagnosis Date   Asthma    childhood   Depression    Diabetes mellitus    Type II   Foot fracture, right 3/13   H/O scoliosis    HLD (hyperlipidemia)    HTN (hypertension)    Osteoarthritis    hands,elbow,knees   Osteoporosis    Rhinitis, allergic    Scoliosis     chronic back pain    Social History   Socioeconomic History   Marital status: Widowed    Spouse name: Not on file   Number of children: Not on file   Years of education: Not on file   Highest education level: Not on file  Occupational History   Not on file  Tobacco Use   Smoking status: Never   Smokeless tobacco: Never  Substance and Sexual Activity   Alcohol use: No   Drug use: No   Sexual activity: Never  Other Topics Concern   Not on file  Social History Narrative   Not on file   Social Determinants of Health   Financial Resource Strain: Not on file  Food Insecurity: No Food Insecurity (06/05/2022)   Hunger Vital Sign    Worried About Running Out of Food in the Last Year: Never true    Ran Out of Food in the Last Year: Never true  Transportation Needs: No Transportation Needs (06/05/2022)   PRAPARE - Hydrologist (Medical): No    Lack of Transportation (Non-Medical): No  Physical Activity: Not on file  Stress: Not on file  Social Connections: Not on file  Intimate Partner Violence: Not At Risk (  06/05/2022)   Humiliation, Afraid, Rape, and Kick questionnaire    Fear of Current or Ex-Partner: No    Emotionally Abused: No    Physically Abused: No    Sexually Abused: No    Family History  Problem Relation Age of Onset   Coronary artery disease Mother    Cancer Mother        thyroid   Heart attack Mother    Coronary artery disease Father    Heart attack Father    Multiple sclerosis Sister    Breast cancer Paternal Grandmother     Allergies  Allergen Reactions   Acetaminophen     REACTION: nausea   Codeine     REACTION: itching   Fluticasone Propionate     REACTION: palpitations   Fosamax [Alendronate Sodium]     Difficulty swallowing and fever/ joint pain    Lidocaine Nausea And Vomiting    Increases heart rate, N/V per pt   Naproxen     REACTION: GI   Nsaids     REACTION: elevated LFT's   Penicillins     REACTION:  swelling at inj site    Review of Systems  Constitutional:  Negative for chills, fever, malaise/fatigue and weight loss.  HENT:  Positive for hearing loss.   Eyes:  Negative for blurred vision, double vision, photophobia and pain.  Respiratory:  Negative for cough and sputum production.   Cardiovascular:  Negative for chest pain, palpitations, orthopnea and PND.  Gastrointestinal:  Negative for constipation, diarrhea, heartburn, nausea and vomiting.  Genitourinary:  Negative for dysuria, frequency and urgency.  Musculoskeletal:  Negative for myalgias.  Skin:  Negative for rash.  Neurological:  Positive for tingling. Negative for dizziness, seizures, weakness and headaches.  Psychiatric/Behavioral:  Positive for memory loss. Negative for depression. The patient has insomnia.        Objective:   BP 134/70   Pulse 95   Ht 5' (1.524 m)   Wt 163 lb 9.6 oz (74.2 kg)   LMP 04/22/1985   SpO2 94%   BMI 31.95 kg/m   Vitals:   06/13/22 1329  BP: 134/70  Pulse: 95  Height: 5' (1.524 m)  Weight: 163 lb 9.6 oz (74.2 kg)  SpO2: 94%  BMI (Calculated): 31.95    Physical Exam Vitals and nursing note reviewed.  Constitutional:      Appearance: She is normal weight.  HENT:     Head: Normocephalic.  Cardiovascular:     Rate and Rhythm: Normal rate and regular rhythm.  Pulmonary:     Effort: Pulmonary effort is normal.     Breath sounds: Normal breath sounds.  Abdominal:     General: Abdomen is flat. Bowel sounds are normal.     Palpations: Abdomen is soft.  Musculoskeletal:        General: Normal range of motion.     Cervical back: Normal range of motion and neck supple.  Skin:    General: Skin is warm.  Neurological:     General: No focal deficit present.     Mental Status: She is alert. Mental status is at baseline.  Psychiatric:        Mood and Affect: Mood normal.        Behavior: Behavior normal.      Results for orders placed or performed in visit on 06/13/22   POCT CBG (Fasting - Glucose)  Result Value Ref Range   Glucose Fasting, POC 351 (A) 70 - 99 mg/dL  Recent Results (from the past 2160 hour(s))  CBG monitoring, ED     Status: None   Collection Time: 06/05/22  4:53 AM  Result Value Ref Range   Glucose-Capillary 94 70 - 99 mg/dL    Comment: Glucose reference range applies only to samples taken after fasting for at least 8 hours.  CBC with Differential/Platelet     Status: Abnormal   Collection Time: 06/05/22  5:00 AM  Result Value Ref Range   WBC 8.3 4.0 - 10.5 K/uL   RBC 3.84 (L) 3.87 - 5.11 MIL/uL   Hemoglobin 11.1 (L) 12.0 - 15.0 g/dL   HCT 34.9 (L) 36.0 - 46.0 %   MCV 90.9 80.0 - 100.0 fL   MCH 28.9 26.0 - 34.0 pg   MCHC 31.8 30.0 - 36.0 g/dL   RDW 15.1 11.5 - 15.5 %   Platelets 217 150 - 400 K/uL   nRBC 0.0 0.0 - 0.2 %   Neutrophils Relative % 59 %   Neutro Abs 5.0 1.7 - 7.7 K/uL   Lymphocytes Relative 30 %   Lymphs Abs 2.5 0.7 - 4.0 K/uL   Monocytes Relative 5 %   Monocytes Absolute 0.5 0.1 - 1.0 K/uL   Eosinophils Relative 5 %   Eosinophils Absolute 0.4 0.0 - 0.5 K/uL   Basophils Relative 1 %   Basophils Absolute 0.0 0.0 - 0.1 K/uL   Immature Granulocytes 0 %   Abs Immature Granulocytes 0.02 0.00 - 0.07 K/uL    Comment: Performed at [facility], [address] [place], [place] [address]  Comprehensive metabolic panel     Status: Abnormal   Collection Time: 06/05/22  5:00 AM  Result Value Ref Range   Sodium 138 135 - 145 mmol/L   Potassium 3.6 3.5 - 5.1 mmol/L   Chloride 104 98 - 111 mmol/L   CO2 24 22 - 32 mmol/L   Glucose, Bld 85 70 - 99 mg/dL    Comment: Glucose reference range applies only to samples taken after fasting for at least 8 hours.   BUN 22 8 - 23 mg/dL   Creatinine, Ser 1.26 (H) 0.44 - 1.00 mg/dL   Calcium 9.7 8.9 - 10.3 mg/dL   Total Protein 7.7 6.5 - 8.1 g/dL   Albumin 3.7 3.5 - 5.0 g/dL   AST 32 15 - 41 U/L   ALT 24 0 - 44 U/L   Alkaline Phosphatase 45 38 - 126 U/L   Total  Bilirubin 0.6 0.3 - 1.2 mg/dL   GFR, Estimated 42 (L) >60 mL/min    Comment: (NOTE) Calculated using the CKD-EPI Creatinine Equation (2021)    Anion gap 10 5 - 15    Comment: Performed at [facility], [address] [place], [place] [address]  Urinalysis, Routine w reflex microscopic -Urine, Catheterized     Status: Abnormal   Collection Time: 06/05/22  5:00 AM  Result Value Ref Range   Color, Urine STRAW (A) YELLOW   APPearance CLEAR (A) CLEAR   Specific Gravity, Urine 1.008 1.005 - 1.030   pH 5.0 5.0 - 8.0   Glucose, UA NEGATIVE NEGATIVE mg/dL   Hgb urine dipstick NEGATIVE NEGATIVE   Bilirubin Urine NEGATIVE NEGATIVE   Ketones, ur NEGATIVE NEGATIVE mg/dL   Protein, ur NEGATIVE NEGATIVE mg/dL   Nitrite NEGATIVE NEGATIVE   Leukocytes,Ua NEGATIVE NEGATIVE    Comment: Performed at [facility], [address] [place], [place] [address]  Hemoglobin A1c  Status: Abnormal   Collection Time: 06/05/22  5:00 AM  Result Value Ref Range   Hgb A1c MFr Bld 6.1 (H) 4.8 - 5.6 %    Comment: (NOTE) Pre diabetes:          5.7%-6.4%  Diabetes:              >6.4%  Glycemic control for   <7.0% adults with diabetes    Mean Plasma Glucose 128.37 mg/dL    Comment: Performed at [facility] [address] [place], [place] [address]  CBG monitoring, ED     Status: Abnormal   Collection Time: 06/05/22  6:06 AM  Result Value Ref Range   Glucose-Capillary 59 (L) 70 - 99 mg/dL    Comment: Glucose reference range applies only to samples taken after fasting for at least 8 hours.  CBG monitoring, ED     Status: None   Collection Time: 06/05/22  7:11 AM  Result Value Ref Range   Glucose-Capillary 98 70 - 99 mg/dL    Comment: Glucose reference range applies only to samples taken after fasting for at least 8 hours.  CBG monitoring, ED     Status: Abnormal   Collection Time: 06/05/22  8:55 AM  Result Value Ref Range   Glucose-Capillary 162 (H) 70 - 99 mg/dL    Comment:  Glucose reference range applies only to samples taken after fasting for at least 8 hours.  CBG monitoring, ED     Status: Abnormal   Collection Time: 06/05/22 11:25 AM  Result Value Ref Range   Glucose-Capillary 199 (H) 70 - 99 mg/dL    Comment: Glucose reference range applies only to samples taken after fasting for at least 8 hours.  CBG monitoring, ED     Status: Abnormal   Collection Time: 06/05/22  4:04 PM  Result Value Ref Range   Glucose-Capillary 196 (H) 70 - 99 mg/dL    Comment: Glucose reference range applies only to samples taken after fasting for at least 8 hours.  Glucose, capillary     Status: Abnormal   Collection Time: 06/05/22  6:53 PM  Result Value Ref Range   Glucose-Capillary 173 (H) 70 - 99 mg/dL    Comment: Glucose reference range applies only to samples taken after fasting for at least 8 hours.  Glucose, capillary     Status: Abnormal   Collection Time: 06/05/22  9:42 PM  Result Value Ref Range   Glucose-Capillary 179 (H) 70 - 99 mg/dL    Comment: Glucose reference range applies only to samples taken after fasting for at least 8 hours.  Basic metabolic panel     Status: Abnormal   Collection Time: 06/06/22  4:22 AM  Result Value Ref Range   Sodium 138 135 - 145 mmol/L   Potassium 3.8 3.5 - 5.1 mmol/L   Chloride 106 98 - 111 mmol/L   CO2 23 22 - 32 mmol/L   Glucose, Bld 155 (H) 70 - 99 mg/dL    Comment: Glucose reference range applies only to samples taken after fasting for at least 8 hours.   BUN 17 8 - 23 mg/dL   Creatinine, Ser 1.24 (H) 0.44 - 1.00 mg/dL   Calcium 9.2 8.9 - 10.3 mg/dL   GFR, Estimated 43 (L) >60 mL/min    Comment: (NOTE) Calculated using the CKD-EPI Creatinine Equation (2021)    Anion gap 9 5 - 15    Comment: Performed at [facility], [address] [place], [place] [address]  CBC     Status: Abnormal   Collection Time: 06/06/22  4:22 AM  Result Value Ref Range   WBC 7.5 4.0 - 10.5 K/uL   RBC 3.43 (L) 3.87 - 5.11 MIL/uL    Hemoglobin 9.9 (L) 12.0 - 15.0 g/dL   HCT 30.7 (L) 36.0 - 46.0 %   MCV 89.5 80.0 - 100.0 fL   MCH 28.9 26.0 - 34.0 pg   MCHC 32.2 30.0 - 36.0 g/dL   RDW 15.0 11.5 - 15.5 %   Platelets 213 150 - 400 K/uL   nRBC 0.0 0.0 - 0.2 %    Comment: Performed at [facility], [address] [place], [place] [address]  Glucose, capillary     Status: Abnormal   Collection Time: 06/06/22  8:23 AM  Result Value Ref Range   Glucose-Capillary 145 (H) 70 - 99 mg/dL    Comment: Glucose reference range applies only to samples taken after fasting for at least 8 hours.  Glucose, capillary     Status: Abnormal   Collection Time: 06/06/22 11:44 AM  Result Value Ref Range   Glucose-Capillary 170 (H) 70 - 99 mg/dL    Comment: Glucose reference range applies only to samples taken after fasting for at least 8 hours.  Glucose, capillary     Status: Abnormal   Collection Time: 06/06/22  4:12 PM  Result Value Ref Range   Glucose-Capillary 181 (H) 70 - 99 mg/dL    Comment: Glucose reference range applies only to samples taken after fasting for at least 8 hours.  Glucose, capillary     Status: Abnormal   Collection Time: 06/06/22  9:38 PM  Result Value Ref Range   Glucose-Capillary 162 (H) 70 - 99 mg/dL    Comment: Glucose reference range applies only to samples taken after fasting for at least 8 hours.  CBC     Status: Abnormal   Collection Time: 06/07/22  7:11 AM  Result Value Ref Range   WBC 7.4 4.0 - 10.5 K/uL   RBC 3.52 (L) 3.87 - 5.11 MIL/uL   Hemoglobin 10.3 (L) 12.0 - 15.0 g/dL   HCT 31.3 (L) 36.0 - 46.0 %   MCV 88.9 80.0 - 100.0 fL   MCH 29.3 26.0 - 34.0 pg   MCHC 32.9 30.0 - 36.0 g/dL   RDW 14.8 11.5 - 15.5 %   Platelets 202 150 - 400 K/uL   nRBC 0.0 0.0 - 0.2 %    Comment: Performed at [facility], [address] [place], [place] [address]  Magnesium     Status: None   Collection Time: 06/07/22  7:11 AM  Result Value Ref Range   Magnesium 1.9 1.7 - 2.4 mg/dL     Comment: Performed at [facility], [address], [place], [place] [id number]  Basic metabolic panel     Status: Abnormal   Collection Time: 06/07/22  7:11 AM  Result Value Ref Range   Sodium 137 135 - 145 mmol/L   Potassium 4.2 3.5 - 5.1 mmol/L   Chloride 108 98 - 111 mmol/L   CO2 24 22 - 32 mmol/L   Glucose, Bld 171 (H) 70 - 99 mg/dL    Comment: Glucose reference range applies only to samples taken after fasting for at least 8 hours.   BUN 17 8 - 23 mg/dL   Creatinine, Ser 1.05 (H) 0.44 - 1.00 mg/dL   Calcium 9.5 8.9 - 10.3 mg/dL  GFR, Estimated 52 (L) >60 mL/min    Comment: (NOTE) Calculated using the CKD-EPI Creatinine Equation (2021)    Anion gap 5 5 - 15    Comment: Performed at [facility], [address] [place], [place] [address]  Phosphorus     Status: None   Collection Time: 06/07/22  7:11 AM  Result Value Ref Range   Phosphorus 3.3 2.5 - 4.6 mg/dL    Comment: Performed at [facility], [address] [place], [place] [address]  Glucose, capillary     Status: Abnormal   Collection Time: 06/07/22  7:27 AM  Result Value Ref Range   Glucose-Capillary 167 (H) 70 - 99 mg/dL    Comment: Glucose reference range applies only to samples taken after fasting for at least 8 hours.  Glucose, capillary     Status: Abnormal   Collection Time: 06/07/22 11:19 AM  Result Value Ref Range   Glucose-Capillary 198 (H) 70 - 99 mg/dL    Comment: Glucose reference range applies only to samples taken after fasting for at least 8 hours.  Glucose, capillary     Status: Abnormal   Collection Time: 06/07/22  5:18 PM  Result Value Ref Range   Glucose-Capillary 242 (H) 70 - 99 mg/dL    Comment: Glucose reference range applies only to samples taken after fasting for at least 8 hours.  Glucose, capillary     Status: Abnormal   Collection Time: 06/07/22  8:17 PM  Result Value Ref Range   Glucose-Capillary 188 (H) 70 - 99 mg/dL    Comment: Glucose reference range  applies only to samples taken after fasting for at least 8 hours.  Glucose, capillary     Status: Abnormal   Collection Time: 06/08/22  7:35 AM  Result Value Ref Range   Glucose-Capillary 188 (H) 70 - 99 mg/dL    Comment: Glucose reference range applies only to samples taken after fasting for at least 8 hours.  Glucose, capillary     Status: Abnormal   Collection Time: 06/08/22 12:33 PM  Result Value Ref Range   Glucose-Capillary 261 (H) 70 - 99 mg/dL    Comment: Glucose reference range applies only to samples taken after fasting for at least 8 hours.  POCT CBG (Fasting - Glucose)     Status: Abnormal   Collection Time: 06/13/22  1:31 PM  Result Value Ref Range   Glucose Fasting, POC 351 (A) 70 - 99 mg/dL      Assessment & Plan:  Emeryn was seen today for follow-up.  Diagnoses and all orders for this visit:  Controlled type 2 diabetes mellitus with complication, with long-term current use of insulin (HCC) -     POCT CBG (Fasting - Glucose)  Poorly controlled diabetes mellitus (HCC) -     CBC With Differential -     COMPLETE METABOLIC PANEL WITH GFR  Mixed hyperlipidemia  Essential hypertension  Moderate late onset Alzheimer's dementia without behavioral disturbance, psychotic disturbance, mood disturbance, or anxiety ([place])    Problem List Items Addressed This Visit     Diabetes type 2, controlled ([place]) - Primary   Relevant Orders   POCT CBG (Fasting - Glucose) (Completed)   Other Visit Diagnoses     Poorly controlled diabetes mellitus ([place])       Relevant Orders   CBC With Differential   COMPLETE METABOLIC PANEL WITH GFR      Patient will resume Novolog Mix 70/30 at 10 unitd q12 hr. Monitor  Fingerstick glucose-  May  need to increase id blood sugar stays high. Return in about 10 days (around 06/23/2022).   Total time spent: 30 minutes  Perrin Maltese, MD  06/13/2022

## 2022-06-14 LAB — CBC WITH DIFFERENTIAL
Basophils Absolute: 0.1 10*3/uL (ref 0.0–0.2)
Basos: 1 %
EOS (ABSOLUTE): 0.1 10*3/uL (ref 0.0–0.4)
Eos: 2 %
Hematocrit: 33.4 % — ABNORMAL LOW (ref 34.0–46.6)
Hemoglobin: 11.1 g/dL (ref 11.1–15.9)
Immature Grans (Abs): 0 10*3/uL (ref 0.0–0.1)
Immature Granulocytes: 0 %
Lymphocytes Absolute: 2 10*3/uL (ref 0.7–3.1)
Lymphs: 23 %
MCH: 29.6 pg (ref 26.6–33.0)
MCHC: 33.2 g/dL (ref 31.5–35.7)
MCV: 89 fL (ref 79–97)
Monocytes Absolute: 0.4 10*3/uL (ref 0.1–0.9)
Monocytes: 5 %
Neutrophils Absolute: 6.3 10*3/uL (ref 1.4–7.0)
Neutrophils: 69 %
RBC: 3.75 x10E6/uL — ABNORMAL LOW (ref 3.77–5.28)
RDW: 13.7 % (ref 11.7–15.4)
WBC: 8.9 10*3/uL (ref 3.4–10.8)

## 2022-06-17 DIAGNOSIS — M19042 Primary osteoarthritis, left hand: Secondary | ICD-10-CM | POA: Diagnosis not present

## 2022-06-17 DIAGNOSIS — M19029 Primary osteoarthritis, unspecified elbow: Secondary | ICD-10-CM | POA: Diagnosis not present

## 2022-06-17 DIAGNOSIS — E11649 Type 2 diabetes mellitus with hypoglycemia without coma: Secondary | ICD-10-CM | POA: Diagnosis not present

## 2022-06-17 DIAGNOSIS — I5022 Chronic systolic (congestive) heart failure: Secondary | ICD-10-CM | POA: Diagnosis not present

## 2022-06-17 DIAGNOSIS — M419 Scoliosis, unspecified: Secondary | ICD-10-CM | POA: Diagnosis not present

## 2022-06-17 DIAGNOSIS — F03918 Unspecified dementia, unspecified severity, with other behavioral disturbance: Secondary | ICD-10-CM | POA: Diagnosis not present

## 2022-06-17 DIAGNOSIS — M19041 Primary osteoarthritis, right hand: Secondary | ICD-10-CM | POA: Diagnosis not present

## 2022-06-17 DIAGNOSIS — I110 Hypertensive heart disease with heart failure: Secondary | ICD-10-CM | POA: Diagnosis not present

## 2022-06-17 DIAGNOSIS — J45909 Unspecified asthma, uncomplicated: Secondary | ICD-10-CM | POA: Diagnosis not present

## 2022-06-18 DIAGNOSIS — I5022 Chronic systolic (congestive) heart failure: Secondary | ICD-10-CM | POA: Diagnosis not present

## 2022-06-18 DIAGNOSIS — I110 Hypertensive heart disease with heart failure: Secondary | ICD-10-CM | POA: Diagnosis not present

## 2022-06-18 DIAGNOSIS — F03918 Unspecified dementia, unspecified severity, with other behavioral disturbance: Secondary | ICD-10-CM | POA: Diagnosis not present

## 2022-06-18 DIAGNOSIS — M19041 Primary osteoarthritis, right hand: Secondary | ICD-10-CM | POA: Diagnosis not present

## 2022-06-18 DIAGNOSIS — E11649 Type 2 diabetes mellitus with hypoglycemia without coma: Secondary | ICD-10-CM | POA: Diagnosis not present

## 2022-06-18 DIAGNOSIS — M19042 Primary osteoarthritis, left hand: Secondary | ICD-10-CM | POA: Diagnosis not present

## 2022-06-18 DIAGNOSIS — M419 Scoliosis, unspecified: Secondary | ICD-10-CM | POA: Diagnosis not present

## 2022-06-18 DIAGNOSIS — J45909 Unspecified asthma, uncomplicated: Secondary | ICD-10-CM | POA: Diagnosis not present

## 2022-06-18 DIAGNOSIS — M19029 Primary osteoarthritis, unspecified elbow: Secondary | ICD-10-CM | POA: Diagnosis not present

## 2022-06-19 ENCOUNTER — Telehealth: Payer: Self-pay

## 2022-06-19 DIAGNOSIS — I110 Hypertensive heart disease with heart failure: Secondary | ICD-10-CM | POA: Diagnosis not present

## 2022-06-19 DIAGNOSIS — M19042 Primary osteoarthritis, left hand: Secondary | ICD-10-CM | POA: Diagnosis not present

## 2022-06-19 DIAGNOSIS — M419 Scoliosis, unspecified: Secondary | ICD-10-CM | POA: Diagnosis not present

## 2022-06-19 DIAGNOSIS — M19029 Primary osteoarthritis, unspecified elbow: Secondary | ICD-10-CM | POA: Diagnosis not present

## 2022-06-19 DIAGNOSIS — M19041 Primary osteoarthritis, right hand: Secondary | ICD-10-CM | POA: Diagnosis not present

## 2022-06-19 DIAGNOSIS — F03918 Unspecified dementia, unspecified severity, with other behavioral disturbance: Secondary | ICD-10-CM | POA: Diagnosis not present

## 2022-06-19 DIAGNOSIS — I5022 Chronic systolic (congestive) heart failure: Secondary | ICD-10-CM | POA: Diagnosis not present

## 2022-06-19 DIAGNOSIS — J45909 Unspecified asthma, uncomplicated: Secondary | ICD-10-CM | POA: Diagnosis not present

## 2022-06-19 DIAGNOSIS — E11649 Type 2 diabetes mellitus with hypoglycemia without coma: Secondary | ICD-10-CM | POA: Diagnosis not present

## 2022-06-19 NOTE — Telephone Encounter (Signed)
[facility] called and left vm regarding pt asked if we can send orders for a hospital bed & wheelchair for pt? Please advise

## 2022-06-20 ENCOUNTER — Other Ambulatory Visit: Payer: Self-pay | Admitting: Internal Medicine

## 2022-06-20 DIAGNOSIS — I5020 Unspecified systolic (congestive) heart failure: Secondary | ICD-10-CM

## 2022-06-24 ENCOUNTER — Ambulatory Visit: Payer: Medicare Other

## 2022-06-24 ENCOUNTER — Ambulatory Visit: Payer: Medicare Other | Admitting: Internal Medicine

## 2022-06-24 DIAGNOSIS — M19042 Primary osteoarthritis, left hand: Secondary | ICD-10-CM | POA: Diagnosis not present

## 2022-06-24 DIAGNOSIS — M19029 Primary osteoarthritis, unspecified elbow: Secondary | ICD-10-CM | POA: Diagnosis not present

## 2022-06-24 DIAGNOSIS — I5022 Chronic systolic (congestive) heart failure: Secondary | ICD-10-CM | POA: Diagnosis not present

## 2022-06-24 DIAGNOSIS — M419 Scoliosis, unspecified: Secondary | ICD-10-CM | POA: Diagnosis not present

## 2022-06-24 DIAGNOSIS — E11649 Type 2 diabetes mellitus with hypoglycemia without coma: Secondary | ICD-10-CM | POA: Diagnosis not present

## 2022-06-24 DIAGNOSIS — F03918 Unspecified dementia, unspecified severity, with other behavioral disturbance: Secondary | ICD-10-CM | POA: Diagnosis not present

## 2022-06-24 DIAGNOSIS — M19041 Primary osteoarthritis, right hand: Secondary | ICD-10-CM | POA: Diagnosis not present

## 2022-06-24 DIAGNOSIS — I110 Hypertensive heart disease with heart failure: Secondary | ICD-10-CM | POA: Diagnosis not present

## 2022-06-24 DIAGNOSIS — J45909 Unspecified asthma, uncomplicated: Secondary | ICD-10-CM | POA: Diagnosis not present

## 2022-06-24 NOTE — Progress Notes (Cosign Needed Addendum)
Follow Up Pharmacist Visit (CCM) Clinical Summary Next CCM Follow Up: Focused pharmacist call in 6 weeks Next AWV: yet to be scheduled Summary for PCP: - Spoke to patient's son today. He has some questions/ concerns he wanted addressed. 1. about her Gabapentin - they are concerned about side effects and them playing into Dementia. Hallucinations have probably become worse. She takes '100mg'$  at bedtime. 2. She has "in home health care"; the family is requesting a hospital bed for the patient. She is living at her son's house. 3. She is starting to get sugar under control. Her morning numbers 87-126 and evenings its 130-177. Insulin is 35 in the morning and 35 in the evening. Glipizide is the other medicine for DM. 3. F/U: they are not sure when they should be F/U and with which provider- Laurelyn Sickle or Wolfgang Phoenix .  Disease Assessments Subjective Information Visit Completed on: 06/24/2022 Subjective: Son has a few questions: 1. about her Gabapentin - they are concerned about side effects and them playing into Dementia. Hallucinations are probably worse. She takes '100mg'$  at bedtime. 2. She has in home health care; they can possibly get her a hospital bed. She is living at her son's house. 3. She is starting to get sugar under control. Her morning numbers 87-126 and evenings its 130-177. Insulin is 35 in the morning and 35 in the evening. Glipizide is the other medicine for DM. 3. F/U an appt not sure when they should be F/U with either S. Humphrey Rolls or Canary Brim is expecting a call on Wednesday with answers. He will call us if there is no response.  Metformin is a no go. They have not tried any other medicines. SDOH: Accountable Health Communities Health-Related Social Needs Screening Tool (BloggerBowl.es) SDOH questions were documented and reviewed (EMR or [employer]) within the past 12 months or since hospitalization?: No What is your living  situation today? (ref #1): I have a steady place to live Think about the place you live. Do you have problems with any of the following? (ref #2): None of the above Within the past 12 months, you worried that your food would run out before you got money to buy more (ref #3): Never true Within the past 12 months, the food you bought just didn't last and you didn't have money to get more (ref #4): Never true In the past 12 months, has lack of reliable transportation kept you from medical appointments, meetings, work or from getting things needed for daily living? (ref #5): No In the past 12 months, has the electric, gas, oil, or water company threatened to shut off services in your home? (ref #6): No How often does anyone, including family and friends, physically hurt you? (ref #7): Never (1) How often does anyone, including family and friends, insult or talk down to you? (ref #8): Never (1) How often does anyone, including friends and family, threaten you with harm? (ref #9): Never (1) How often does anyone, including family and friends, scream or curse at you? (ref #10): Never (1)  Medication Adherence Does the [facility] have access to medication refill history?: Yes Medication adherence rates for the STAR metric medications: Ezetimibe 10 mg - 06/03/22 90 DS Glipizide 2.5 mg - 06/08/22 30 DS Rosuvastatin 40 mg - 04/03/22 90 DS Medication adherence rates for non-STAR metric medications: None .  Hypertension (HTN) Most Recent BP: 134/70 Most Recent HR: 95 taken on: 06/13/2022 Care Gap: Need BP documented or last BP 140/90 or  higher: Needs to be addressed Assessed today?: Yes BP today is: 163/73 Goal: <130/80 mmHG Is Patient checking BP at home?: Yes Has patient experienced hypotension, dizziness, falls or bradycardia?: Yes We discussed: Contacting PCP office for signs and symptoms of high or low blood pressure (hypotension, dizziness, falls, headaches, edema) Assessment:: Uncontrolled Drug:  carvedilol 12.5 mg Pharmacist Assessment: Appropriate, Query Effectiveness  Hyperlipidemia/Dyslipidemia (HLD) Last Lipid panel on: 03/01/2022 TC (Goal<200): 105 LDL: 30 HDL (Goal>40): 41 TG (Goal<150): 219 ASCVD 10-year risk?is:: N/A due to Age > 69 Assessed today?: No Drug: rosuvastatin 40 mg Pharmacist Assessment: Appropriate, Effective, Safe, Accessible  Diabetes (DM) Most recent A1C: 6.1 taken on: 06/11/2022 Most Recent GFR: 33 taken on: 03/01/2022 Type: 2 Most recent microalbumin ratio: 0.4 tested on: 04/02/2016 Care Gap: Statin therapy needed: Needs to be addressed Care Gap: Need A1c documented or last A1c > 9 %: Needs to be addressed Care Gap: Need eye exam documented in EMR or by claim: Needs to be addressed Care Gap: Need eGFR and uACR for kidney health evaluation: Needs to be addressed Assessed today?: No Drug: novolog Mix 70/30 FlexPen  Heart Failure Assessed today?: No Drug: entresto 24-26 mg  Drug: nitroglycerin 0.4 mg  Drug: furosemide 20 mg  Chronic kidney disease (CKD) Most Recent GFR: 33 taken on: 03/01/2022 Previous GFR: 38 taken on: 12/04/2021 Most recent microalbumin ratio: 0.4 tested on: 04/02/2016 Assessed today?: No Osteopenia or Osteoporosis Most recent Vitamin D 25-OH: 64.9 taken on: 10/17/2020 Assessed today?: No Drug: baclofen 10 mg  Drug:  tylenol extra strength 500-25 mg Preventative Health Care Gap: Colorectal cancer screening: Patient excluded from population (Age > 92, hx of colorectal cancer or colectomy, hospice services) Care Gap: Breast cancer screening: Patient excluded from population (Age > 46, hx of bilateral mastectomy, frailty, hospice services) Care Gap: Annual Wellness Visit (AWV): Needs to be addressed [name] on 06/20/2022 11:49 AM HC Chart Review: 15 min 06/20/22 OPT  HC Assessment call time spent: 15 min 06/20/22 OPT   Pharmacy Interventions Intervention Details Pharmacist Interventions discussed:  Yes Education: Lifestyle modifications  Jerral Ralph, PharmD  Chart review and Televisit 54mns  Documentation 166ms

## 2022-06-26 DIAGNOSIS — E11649 Type 2 diabetes mellitus with hypoglycemia without coma: Secondary | ICD-10-CM | POA: Diagnosis not present

## 2022-06-26 DIAGNOSIS — M419 Scoliosis, unspecified: Secondary | ICD-10-CM | POA: Diagnosis not present

## 2022-06-26 DIAGNOSIS — M19029 Primary osteoarthritis, unspecified elbow: Secondary | ICD-10-CM | POA: Diagnosis not present

## 2022-06-26 DIAGNOSIS — I110 Hypertensive heart disease with heart failure: Secondary | ICD-10-CM | POA: Diagnosis not present

## 2022-06-26 DIAGNOSIS — J45909 Unspecified asthma, uncomplicated: Secondary | ICD-10-CM | POA: Diagnosis not present

## 2022-06-26 DIAGNOSIS — M19042 Primary osteoarthritis, left hand: Secondary | ICD-10-CM | POA: Diagnosis not present

## 2022-06-26 DIAGNOSIS — F03918 Unspecified dementia, unspecified severity, with other behavioral disturbance: Secondary | ICD-10-CM | POA: Diagnosis not present

## 2022-06-26 DIAGNOSIS — I5022 Chronic systolic (congestive) heart failure: Secondary | ICD-10-CM | POA: Diagnosis not present

## 2022-06-26 DIAGNOSIS — M19041 Primary osteoarthritis, right hand: Secondary | ICD-10-CM | POA: Diagnosis not present

## 2022-06-28 ENCOUNTER — Other Ambulatory Visit: Payer: Self-pay | Admitting: Internal Medicine

## 2022-06-28 DIAGNOSIS — G301 Alzheimer's disease with late onset: Secondary | ICD-10-CM

## 2022-07-02 ENCOUNTER — Encounter: Payer: Self-pay | Admitting: Internal Medicine

## 2022-07-02 ENCOUNTER — Ambulatory Visit (INDEPENDENT_AMBULATORY_CARE_PROVIDER_SITE_OTHER): Payer: Medicare HMO | Admitting: Internal Medicine

## 2022-07-02 VITALS — BP 118/68 | HR 84 | Ht 60.0 in | Wt 168.0 lb

## 2022-07-02 DIAGNOSIS — F0280 Dementia in other diseases classified elsewhere without behavioral disturbance: Secondary | ICD-10-CM | POA: Diagnosis not present

## 2022-07-02 DIAGNOSIS — M419 Scoliosis, unspecified: Secondary | ICD-10-CM | POA: Diagnosis not present

## 2022-07-02 DIAGNOSIS — E11649 Type 2 diabetes mellitus with hypoglycemia without coma: Secondary | ICD-10-CM | POA: Diagnosis not present

## 2022-07-02 DIAGNOSIS — M19041 Primary osteoarthritis, right hand: Secondary | ICD-10-CM | POA: Diagnosis not present

## 2022-07-02 DIAGNOSIS — M19029 Primary osteoarthritis, unspecified elbow: Secondary | ICD-10-CM | POA: Diagnosis not present

## 2022-07-02 DIAGNOSIS — E119 Type 2 diabetes mellitus without complications: Secondary | ICD-10-CM | POA: Diagnosis not present

## 2022-07-02 DIAGNOSIS — R110 Nausea: Secondary | ICD-10-CM | POA: Insufficient documentation

## 2022-07-02 DIAGNOSIS — E1142 Type 2 diabetes mellitus with diabetic polyneuropathy: Secondary | ICD-10-CM | POA: Diagnosis not present

## 2022-07-02 DIAGNOSIS — F03918 Unspecified dementia, unspecified severity, with other behavioral disturbance: Secondary | ICD-10-CM | POA: Diagnosis not present

## 2022-07-02 DIAGNOSIS — J45909 Unspecified asthma, uncomplicated: Secondary | ICD-10-CM | POA: Diagnosis not present

## 2022-07-02 DIAGNOSIS — G309 Alzheimer's disease, unspecified: Secondary | ICD-10-CM

## 2022-07-02 DIAGNOSIS — M19042 Primary osteoarthritis, left hand: Secondary | ICD-10-CM | POA: Diagnosis not present

## 2022-07-02 DIAGNOSIS — I5022 Chronic systolic (congestive) heart failure: Secondary | ICD-10-CM | POA: Diagnosis not present

## 2022-07-02 DIAGNOSIS — I340 Nonrheumatic mitral (valve) insufficiency: Secondary | ICD-10-CM | POA: Diagnosis not present

## 2022-07-02 DIAGNOSIS — I110 Hypertensive heart disease with heart failure: Secondary | ICD-10-CM | POA: Diagnosis not present

## 2022-07-02 LAB — POCT CBG (FASTING - GLUCOSE)-MANUAL ENTRY: Glucose Fasting, POC: 102 mg/dL — AB (ref 70–99)

## 2022-07-02 MED ORDER — MEMANTINE HCL 5 MG PO TABS: 5.0000 mg | ORAL_TABLET | Freq: Two times a day (BID) | ORAL | 3 refills | Status: DC

## 2022-07-02 MED ORDER — GABAPENTIN 100 MG PO CAPS: 100.0000 mg | ORAL_CAPSULE | Freq: Every day | ORAL | 3 refills | Status: DC

## 2022-07-02 MED ORDER — ONDANSETRON HCL 4 MG PO TABS: 4.0000 mg | ORAL_TABLET | Freq: Three times a day (TID) | ORAL | 0 refills | Status: DC | PRN

## 2022-07-02 NOTE — Progress Notes (Signed)
Established Patient Office Visit  Subjective:  Patient ID: [name], female    DOB: 1935/08/12  Age: 87 y.o. MRN: [id number]  No chief complaint on file.   Patient comes in for follow-up accompanied by her son and friend.  She is sitting comfortably and offers no new complaints.  However the son mentions that at night she gets sundowning and sometimes wakes up from sleep and is confused.  She is never combative or agitated.  She was previously started on Aricept for her dementia but it was stopped because of her nausea and vomiting episodes.  At this time we will try starting Namenda. Her blood sugars are running very nicely at home at Humalog 70/30 mix, which is now 25 units twice a day.  Since her blood sugars are under good control we can stop her glipizide 2.5 mg twice a day.  She will continue the diet control.  She is also getting PT and OT at home.     Past Medical History:  Diagnosis Date   Asthma    childhood   Depression    Diabetes mellitus    Type II   Foot fracture, right 3/13   H/O scoliosis    HLD (hyperlipidemia)    HTN (hypertension)    Osteoarthritis    hands,elbow,knees   Osteoporosis    Rhinitis, allergic    Scoliosis    chronic back pain    Past Surgical History:  Procedure Laterality Date   bladder tack     CARDIAC CATHETERIZATION Right 03/19/2016   Procedure: Right/Left Heart Cath and Coronary Angiography;  Surgeon: Dionisio David, MD;  Location: [facility];  Service: Cardiovascular;  Laterality: Right;   TUBAL LIGATION     bilateral    Social History   Socioeconomic History   Marital status: Widowed    Spouse name: Not on file   Number of children: Not on file   Years of education: Not on file   Highest education level: Not on file  Occupational History   Not on file  Tobacco Use   Smoking status: Never   Smokeless tobacco: Never  Substance and Sexual Activity   Alcohol use: No   Drug use: No   Sexual activity:  Never  Other Topics Concern   Not on file  Social History Narrative   Not on file   Social Determinants of Health   Financial Resource Strain: Not on file  Food Insecurity: No Food Insecurity (06/05/2022)   Hunger Vital Sign    Worried About Running Out of Food in the Last Year: Never true    Ran Out of Food in the Last Year: Never true  Transportation Needs: No Transportation Needs (06/05/2022)   PRAPARE - Hydrologist (Medical): No    Lack of Transportation (Non-Medical): No  Physical Activity: Not on file  Stress: Not on file  Social Connections: Not on file  Intimate Partner Violence: Not At Risk (06/05/2022)   Humiliation, Afraid, Rape, and Kick questionnaire    Fear of Current or Ex-Partner: No    Emotionally Abused: No    Physically Abused: No    Sexually Abused: No    Family History  Problem Relation Age of Onset   Coronary artery disease Mother    Cancer Mother        thyroid   Heart attack Mother    Coronary artery disease Father    Heart attack Father  Multiple sclerosis Sister    Breast cancer Paternal Grandmother     Allergies  Allergen Reactions   Acetaminophen     REACTION: nausea   Codeine     REACTION: itching   Fluticasone Propionate     REACTION: palpitations   Fosamax [Alendronate Sodium]     Difficulty swallowing and fever/ joint pain    Lidocaine Nausea And Vomiting    Increases heart rate, N/V per pt   Naproxen     REACTION: GI   Nsaids     REACTION: elevated LFT's   Penicillins     REACTION: swelling at inj site    Review of Systems  Constitutional: Negative.   HENT: Negative.    Eyes: Negative.   Respiratory: Negative.    Cardiovascular: Negative.   Gastrointestinal: Negative.   Genitourinary: Negative.   Musculoskeletal: Negative.   Skin: Negative.   Neurological: Negative.   Endo/Heme/Allergies: Negative.   Psychiatric/Behavioral: Negative.         Objective:   BP 118/68   Pulse 84    Ht 5' (1.524 m)   Wt 168 lb (76.2 kg)   LMP 04/22/1985   SpO2 98%   BMI 32.81 kg/m   Vitals:   07/02/22 1045  BP: 118/68  Pulse: 84  Height: 5' (1.524 m)  Weight: 168 lb (76.2 kg)  SpO2: 98%  BMI (Calculated): 32.81    Physical Exam Vitals and nursing note reviewed.  Cardiovascular:     Rate and Rhythm: Normal rate and regular rhythm.  Pulmonary:     Effort: Pulmonary effort is normal.     Breath sounds: Normal breath sounds.  Abdominal:     General: Abdomen is flat.     Palpations: Abdomen is soft.  Musculoskeletal:        General: Normal range of motion.     Cervical back: Normal range of motion.  Skin:    General: Skin is warm.  Neurological:     General: No focal deficit present.     Mental Status: She is alert and oriented to person, place, and time.  Psychiatric:        Mood and Affect: Mood normal.      Results for orders placed or performed in visit on 07/02/22  POCT CBG (Fasting - Glucose)  Result Value Ref Range   Glucose Fasting, POC 102 (A) 70 - 99 mg/dL        Assessment & Plan:  Continue Humalog mix 70/30 at current dose.  Stop glipizide 2.5 mg twice a day.  Start Namenda and monitor for side effects. Will check labs at next visit. Problem List Items Addressed This Visit     Diabetes type 2, controlled ([place]) - Primary   Relevant Orders   POCT CBG (Fasting - Glucose) (Completed)   Alzheimer disease (HCC)   Relevant Medications   memantine (NAMENDA) 5 MG tablet   gabapentin (NEURONTIN) 100 MG capsule   Nausea   Relevant Medications   ondansetron (ZOFRAN) 4 MG tablet   Diabetic polyneuropathy associated with type 2 diabetes mellitus (HCC)   Relevant Medications   memantine (NAMENDA) 5 MG tablet   gabapentin (NEURONTIN) 100 MG capsule   Nonrheumatic mitral valve regurgitation   Relevant Orders   PCV ECHOCARDIOGRAM COMPLETE    Return in about 2 months (around 09/01/2022).   Total time spent: 30 minutes  Perrin Maltese,  MD  07/02/2022

## 2022-07-03 DIAGNOSIS — M419 Scoliosis, unspecified: Secondary | ICD-10-CM | POA: Diagnosis not present

## 2022-07-03 DIAGNOSIS — M19041 Primary osteoarthritis, right hand: Secondary | ICD-10-CM | POA: Diagnosis not present

## 2022-07-03 DIAGNOSIS — I110 Hypertensive heart disease with heart failure: Secondary | ICD-10-CM | POA: Diagnosis not present

## 2022-07-03 DIAGNOSIS — I5022 Chronic systolic (congestive) heart failure: Secondary | ICD-10-CM | POA: Diagnosis not present

## 2022-07-03 DIAGNOSIS — M19042 Primary osteoarthritis, left hand: Secondary | ICD-10-CM | POA: Diagnosis not present

## 2022-07-03 DIAGNOSIS — J45909 Unspecified asthma, uncomplicated: Secondary | ICD-10-CM | POA: Diagnosis not present

## 2022-07-03 DIAGNOSIS — M19029 Primary osteoarthritis, unspecified elbow: Secondary | ICD-10-CM | POA: Diagnosis not present

## 2022-07-03 DIAGNOSIS — E11649 Type 2 diabetes mellitus with hypoglycemia without coma: Secondary | ICD-10-CM | POA: Diagnosis not present

## 2022-07-03 DIAGNOSIS — F03918 Unspecified dementia, unspecified severity, with other behavioral disturbance: Secondary | ICD-10-CM | POA: Diagnosis not present

## 2022-07-08 ENCOUNTER — Ambulatory Visit (INDEPENDENT_AMBULATORY_CARE_PROVIDER_SITE_OTHER): Payer: Medicare HMO

## 2022-07-08 DIAGNOSIS — M19029 Primary osteoarthritis, unspecified elbow: Secondary | ICD-10-CM | POA: Diagnosis not present

## 2022-07-08 DIAGNOSIS — F03918 Unspecified dementia, unspecified severity, with other behavioral disturbance: Secondary | ICD-10-CM | POA: Diagnosis not present

## 2022-07-08 DIAGNOSIS — I361 Nonrheumatic tricuspid (valve) insufficiency: Secondary | ICD-10-CM

## 2022-07-08 DIAGNOSIS — I5022 Chronic systolic (congestive) heart failure: Secondary | ICD-10-CM | POA: Diagnosis not present

## 2022-07-08 DIAGNOSIS — J45909 Unspecified asthma, uncomplicated: Secondary | ICD-10-CM | POA: Diagnosis not present

## 2022-07-08 DIAGNOSIS — M419 Scoliosis, unspecified: Secondary | ICD-10-CM | POA: Diagnosis not present

## 2022-07-08 DIAGNOSIS — I340 Nonrheumatic mitral (valve) insufficiency: Secondary | ICD-10-CM

## 2022-07-08 DIAGNOSIS — E11649 Type 2 diabetes mellitus with hypoglycemia without coma: Secondary | ICD-10-CM | POA: Diagnosis not present

## 2022-07-08 DIAGNOSIS — M19041 Primary osteoarthritis, right hand: Secondary | ICD-10-CM | POA: Diagnosis not present

## 2022-07-08 DIAGNOSIS — M19042 Primary osteoarthritis, left hand: Secondary | ICD-10-CM | POA: Diagnosis not present

## 2022-07-08 DIAGNOSIS — I110 Hypertensive heart disease with heart failure: Secondary | ICD-10-CM | POA: Diagnosis not present

## 2022-07-09 ENCOUNTER — Other Ambulatory Visit: Payer: Self-pay | Admitting: Internal Medicine

## 2022-07-09 DIAGNOSIS — E782 Mixed hyperlipidemia: Secondary | ICD-10-CM

## 2022-07-09 DIAGNOSIS — E118 Type 2 diabetes mellitus with unspecified complications: Secondary | ICD-10-CM

## 2022-07-12 ENCOUNTER — Ambulatory Visit: Payer: Medicare HMO | Admitting: Cardiovascular Disease

## 2022-07-15 ENCOUNTER — Ambulatory Visit (INDEPENDENT_AMBULATORY_CARE_PROVIDER_SITE_OTHER): Payer: Medicare HMO | Admitting: Cardiovascular Disease

## 2022-07-15 ENCOUNTER — Encounter: Payer: Self-pay | Admitting: Cardiovascular Disease

## 2022-07-15 VITALS — BP 134/60 | HR 82 | Ht 61.0 in | Wt 168.0 lb

## 2022-07-15 DIAGNOSIS — M19029 Primary osteoarthritis, unspecified elbow: Secondary | ICD-10-CM | POA: Diagnosis not present

## 2022-07-15 DIAGNOSIS — E11649 Type 2 diabetes mellitus with hypoglycemia without coma: Secondary | ICD-10-CM | POA: Diagnosis not present

## 2022-07-15 DIAGNOSIS — I10 Essential (primary) hypertension: Secondary | ICD-10-CM | POA: Diagnosis not present

## 2022-07-15 DIAGNOSIS — F03918 Unspecified dementia, unspecified severity, with other behavioral disturbance: Secondary | ICD-10-CM | POA: Diagnosis not present

## 2022-07-15 DIAGNOSIS — E782 Mixed hyperlipidemia: Secondary | ICD-10-CM

## 2022-07-15 DIAGNOSIS — I5022 Chronic systolic (congestive) heart failure: Secondary | ICD-10-CM

## 2022-07-15 DIAGNOSIS — M19041 Primary osteoarthritis, right hand: Secondary | ICD-10-CM | POA: Diagnosis not present

## 2022-07-15 DIAGNOSIS — M419 Scoliosis, unspecified: Secondary | ICD-10-CM | POA: Diagnosis not present

## 2022-07-15 DIAGNOSIS — I110 Hypertensive heart disease with heart failure: Secondary | ICD-10-CM | POA: Diagnosis not present

## 2022-07-15 DIAGNOSIS — M19042 Primary osteoarthritis, left hand: Secondary | ICD-10-CM | POA: Diagnosis not present

## 2022-07-15 DIAGNOSIS — I340 Nonrheumatic mitral (valve) insufficiency: Secondary | ICD-10-CM

## 2022-07-15 DIAGNOSIS — J45909 Unspecified asthma, uncomplicated: Secondary | ICD-10-CM | POA: Diagnosis not present

## 2022-07-15 NOTE — Assessment & Plan Note (Signed)
LDL 30 03/01/22. Continue Zetia and Crestor.

## 2022-07-15 NOTE — Assessment & Plan Note (Signed)
Moderate MVR on 07/08/22 echo. Denies shortness of breath.

## 2022-07-15 NOTE — Progress Notes (Signed)
Cardiology Office Note   Date:  07/15/2022   ID:  [name], DOB 01-Jun-1935, MRN [id number]  PCP:  Perrin Maltese, MD  Cardiologist:  Neoma Laming, MD      History of Present Illness: [name] is a 87 y.o. female who presents for  Chief Complaint  Patient presents with   Follow-up    Follow up    Patient in office for routine cardiac exam. Denies chest pain, shortness of breath, edema, palpitations.      Past Medical History:  Diagnosis Date   Asthma    childhood   Depression    Diabetes mellitus    Type II   Foot fracture, right 3/13   H/O scoliosis    HLD (hyperlipidemia)    HTN (hypertension)    Osteoarthritis    hands,elbow,knees   Osteoporosis    Rhinitis, allergic    Scoliosis    chronic back pain     Past Surgical History:  Procedure Laterality Date   bladder tack     CARDIAC CATHETERIZATION Right 03/19/2016   Procedure: Right/Left Heart Cath and Coronary Angiography;  Surgeon: Dionisio David, MD;  Location: [facility];  Service: Cardiovascular;  Laterality: Right;   TUBAL LIGATION     bilateral     Current Outpatient Medications  Medication Sig Dispense Refill   ACCU-CHEK GUIDE test strip USE TO CHECK BLOOD GLUCOSE DAILY 100 strip 6   Blood Glucose Monitoring Suppl (ONETOUCH VERIO FLEX SYSTEM) W/DEVICE KIT 1 Device by Other route daily. Check blood sugar once daily and as directed. Dx E11.9 1 kit 0   Calcium Carbonate-Vitamin D (CALCIUM 600+D) 600-400 MG-UNIT per tablet Take 2 tablets by mouth daily.       carvedilol (COREG) 12.5 MG tablet TAKE 1 TABLET BY MOUTH TWICE A DAY 180 tablet 3   ENTRESTO 24-26 MG TAKE 1/2 (HALF) TABLET TWICE DAILY. 30 tablet 5   ezetimibe (ZETIA) 10 MG tablet TAKE 1 TABLET BY MOUTH EVERY DAY 90 tablet 3   gabapentin (NEURONTIN) 100 MG capsule Take 1 capsule (100 mg total) by mouth daily. 90 capsule 3   memantine (NAMENDA) 5 MG tablet Take 1 tablet (5 mg total) by mouth 2 (two) times daily. 60  tablet 3   Multiple Vitamin (MULTIVITAMIN) tablet Take 1 tablet by mouth daily.       NOVOLOG MIX 70/30 FLEXPEN (70-30) 100 UNIT/ML FlexPen Inject 35 Units into the skin daily with breakfast. 35 with breakfast and 30 with supper     rosuvastatin (CRESTOR) 40 MG tablet TAKE 1 TABLET BY MOUTH EVERY DAY 90 tablet 3   Vitamin D, Ergocalciferol, (DRISDOL) 1.25 MG (50000 UNIT) CAPS capsule Take 50,000 Units by mouth every Monday.     fish oil-omega-3 fatty acids 1000 MG capsule Take 1 capsule by mouth 2 (two) times daily.  (Patient not taking: Reported on 07/15/2022)     nitroGLYCERIN (NITROSTAT) 0.4 MG SL tablet Place 1 tablet (0.4 mg total) under the tongue every 5 (five) minutes as needed for chest pain. (Patient not taking: Reported on 07/15/2022) 30 tablet 0   ondansetron (ZOFRAN) 4 MG tablet Take 1 tablet (4 mg total) by mouth every 8 (eight) hours as needed for nausea or vomiting. (Patient not taking: Reported on 07/15/2022) 60 tablet 0   No current facility-administered medications for this visit.    Allergies:   Acetaminophen, Codeine, Fluticasone propionate, Fosamax [alendronate sodium], Lidocaine, Naproxen, Nsaids, and Penicillins  Social History:   reports that she has never smoked. She has never used smokeless tobacco. She reports that she does not drink alcohol and does not use drugs.   Family History:  family history includes Breast cancer in her paternal grandmother; Cancer in her mother; Coronary artery disease in her father and mother; Heart attack in her father and mother; Multiple sclerosis in her sister.    ROS:     Review of Systems  Constitutional: Negative.   HENT: Negative.    Eyes: Negative.   Respiratory: Negative.    Cardiovascular: Negative.   Gastrointestinal: Negative.   Genitourinary: Negative.   Musculoskeletal: Negative.   Skin: Negative.   Neurological: Negative.   Endo/Heme/Allergies: Negative.   Psychiatric/Behavioral: Negative.    All other systems  reviewed and are negative.   All other systems are reviewed and negative.   PHYSICAL EXAM: VS:  BP 134/60   Pulse 82   Ht 5\' 1"  (1.549 m)   Wt 168 lb (76.2 kg)   LMP 04/22/1985   SpO2 93%   BMI 31.74 kg/m  , BMI Body mass index is 31.74 kg/m. Last weight:  Wt Readings from Last 3 Encounters:  07/15/22 168 lb (76.2 kg)  07/02/22 168 lb (76.2 kg)  06/13/22 163 lb 9.6 oz (74.2 kg)     Physical Exam Constitutional:      Appearance: Normal appearance.  Cardiovascular:     Rate and Rhythm: Normal rate and regular rhythm.     Heart sounds: Normal heart sounds.  Pulmonary:     Effort: Pulmonary effort is normal.     Breath sounds: Normal breath sounds.  Musculoskeletal:     Right lower leg: No edema.     Left lower leg: No edema.  Neurological:     Mental Status: She is alert.     EKG: none today  Recent Labs: 06/05/2022: ALT 24 06/07/2022: BUN 17; Creatinine, Ser 1.05; Magnesium 1.9; Platelets 202; Potassium 4.2; Sodium 137 06/13/2022: Hemoglobin 11.1    Lipid Panel    Component Value Date/Time   CHOL 200 03/11/2016 1109   TRIG 138.0 03/11/2016 1109   HDL 47.80 03/11/2016 1109   CHOLHDL 4 03/11/2016 1109   VLDL 27.6 03/11/2016 1109   LDLCALC 124 (H) 03/11/2016 1109    Other studies Reviewed: Patient: [name] - Amil Amen DOB:  1936/02/04   Date:  09/29/2019 10:00 Provider: Neoma Laming MD Encounter: ECHO   Page 2 REASON FOR VISIT  Visit for: Echocardiogram - Heart failure  Sex: Female   wt= 156 lbs.  BP= 116/64  Height=  61 inches.   TESTS  Imaging: Echocardiogram:  An echocardiogram in (2-d) mode was performed and in Doppler mode with color flow velocity mapping was performed. The aortic valve cusps are normal 1.6 cm, flow velocity was normal 1.5 m/s, and normal calculated aortic valve systolic mean flow gradient 6.0 mmHg. Mitral valve diastolic peak flow velocity E .72 m/s and E/A ratio 0.7. Aortic root diameter 2.6 cm. The LVOT internal  diameter was normal 2.0 cm and flow velocity was normal .83 m/s. LV systolic dimension 3.1 cm, diastolic 4.8 cm, posterior wall thickness 0.9 cm, fractional shortening 35 %, and EF 60-65 %. IVS thickness 1.1 cm. LA dimension 5.3 cm  RIGHT atrium= 11.0 cm2. Mitral Valve =  Ea= 4.9  DT= 246 msec. Tricuspid Valve =  TR jet V= 2.7      RAP= 5  RVSP= 35.0 mmHg. Pulmonic Valve= PIEDV= 1.1 m/s.  Aortic Valve has Trace to Mild Regurgitation. Mitral Valve has Moderate Regurgitation. Pulmonic Valve has Mild Regurgitation. Tricuspid Valve has Mild to Moderate Regurgitation.     ASSESSMENT  Suboptimal study due to poor windows.  Unable to visualize and evaluate right side of heart.   Moderately dilated left atrium with left ventricle, right atrium and ventricle, and aorta appearing normal in size.  Normal left ventricular systolic function and GRADE 1 (relaxation abnormality) diastolic dysfunction.   Normal left ventricular wall motion.  Mild pulmonary regurgitation.  Mild to moderate tricuspid regurgitation.   Mild pulmonary hypertension.   Moderate mitral regurgitation (2 jets).  Trace to mild aortic regurgitation.   Trivial posterior pericardial effusion without hemodynamic compromise or tamponade.  Incidental finding on left lobe of liver; round and echogenic measuring 2.3 x 2.6 cm.     THERAPY   Referring physician: Dionisio David  Sonographer: Welton Flakes, RCS.   Neoma Laming MD  Electronically signed by: Neoma Laming     Date: 10/04/2019 10:07  Patient: [id number] - Amil Amen DOB:  [birth year]  Date:  11/15/2016 08:15 Provider: Neoma Laming MD Encounter: [facility] TEST   Page 1 TESTS    [facility] [address] [place], [place] [address] [phone] STUDY:  Rest / Gated Stress Myocardial Perfusion With Wall Motion, Left Ventricular Ejection Fraction.Persantine Stress Test. SEX:      Female                                                                                                                                                                                                                   REFERRING PHYSICIAN:  Neoma Laming  INDICATION FOR STUDY:  Dilated Cardiomyopathy.                                                                                                                                                                                                                    TECHNIQUE:  Approximately 45 minutes following the intravenous administration of 9.9  mCi of Tc-76m Sestamibi with the patient at rest in a reclined supine position with arms above their head if able to do so, SPECT imaging of the heart was performed.  The patient then underwent stress testing.  At peak stress, the patient was injected intravenously with 28.7  mCi of Tc-54m Sestamibi.  Approximately 45 minutes later in the same position as rest imaging, gated SPECT imaging of the heart was performed.  STRESS BY:  Neoma Laming, MD PROTOCOL:   Persantine                                                                                      MAX PRED HR: 141                     85%: 120               75%: 106                                                                                                                   RESTING BP: 150/70   RESTING HR: 59  PEAK BP: 142/82    PEAK HR: 81  EXERCISE DURATION:    4 min injection                                            REASON FOR TEST TERMINATION:    Protocol end                                                                                                                              SYMPTOMS:   None                                                                                                                                                                                                           EKG RESULTS:  Sinus bradycardia. 59/min. LBBB. Non specific ST/T changes, no changes with persantine.  PERFUSION/WALL MOTION FINDINGS:  EF = 69%. Small mild septal and apical wall reversible defects.                                                                         IMPRESSION: Equivocal stress test with normal LVEF.                                                                                                                                                                                                                                                                                        Neoma Laming, MD Stress Interpreting Physician / Nuclear Interpreting Physician        Neoma Laming MD  Electronically signed by: Neoma Laming     Date: 11/19/2016 14:18   ASSESSMENT AND PLAN:    ICD-10-CM   1. Chronic systolic congestive heart failure (HCC)  I50.22     2. Essential hypertension  I10     3. Mixed hyperlipidemia  E78.2     4. Nonrheumatic mitral valve regurgitation  I34.0        Problem List Items Addressed This Visit       Cardiovascular and Mediastinum   Essential hypertension    Controlled on Entresto, Coreg, continue.       Chronic systolic congestive heart failure ([place]) - Primary    Patient feeling well. Denies chest pain, shortness of breath, lower extremity edema. Recent echo revealed normal EF. Continue same medications.        Nonrheumatic mitral valve regurgitation    Moderate MVR on 07/08/22 echo. Denies shortness of breath.         Other   Hyperlipidemia    LDL 30 03/01/22. Continue Zetia and Crestor.           Disposition:   Return in about 6 months (around 01/15/2023).    Total time spent: 30 minutes  Signed,  Neoma Laming, MD  07/15/2022 2:06 PM    [facility]

## 2022-07-15 NOTE — Assessment & Plan Note (Signed)
Patient feeling well. Denies chest pain, shortness of breath, lower extremity edema. Recent echo revealed normal EF. Continue same medications.

## 2022-07-15 NOTE — Assessment & Plan Note (Signed)
Controlled on Entresto, Coreg, continue.

## 2022-07-18 DIAGNOSIS — E11649 Type 2 diabetes mellitus with hypoglycemia without coma: Secondary | ICD-10-CM | POA: Diagnosis not present

## 2022-07-18 DIAGNOSIS — M419 Scoliosis, unspecified: Secondary | ICD-10-CM | POA: Diagnosis not present

## 2022-07-18 DIAGNOSIS — I110 Hypertensive heart disease with heart failure: Secondary | ICD-10-CM | POA: Diagnosis not present

## 2022-07-18 DIAGNOSIS — J45909 Unspecified asthma, uncomplicated: Secondary | ICD-10-CM | POA: Diagnosis not present

## 2022-07-18 DIAGNOSIS — F03918 Unspecified dementia, unspecified severity, with other behavioral disturbance: Secondary | ICD-10-CM | POA: Diagnosis not present

## 2022-07-18 DIAGNOSIS — M19029 Primary osteoarthritis, unspecified elbow: Secondary | ICD-10-CM | POA: Diagnosis not present

## 2022-07-18 DIAGNOSIS — I5022 Chronic systolic (congestive) heart failure: Secondary | ICD-10-CM | POA: Diagnosis not present

## 2022-07-18 DIAGNOSIS — M19042 Primary osteoarthritis, left hand: Secondary | ICD-10-CM | POA: Diagnosis not present

## 2022-07-18 DIAGNOSIS — M19041 Primary osteoarthritis, right hand: Secondary | ICD-10-CM | POA: Diagnosis not present

## 2022-07-24 ENCOUNTER — Other Ambulatory Visit: Payer: Self-pay | Admitting: Internal Medicine

## 2022-07-24 DIAGNOSIS — F0280 Dementia in other diseases classified elsewhere without behavioral disturbance: Secondary | ICD-10-CM

## 2022-07-25 DIAGNOSIS — J45909 Unspecified asthma, uncomplicated: Secondary | ICD-10-CM | POA: Diagnosis not present

## 2022-07-25 DIAGNOSIS — E11649 Type 2 diabetes mellitus with hypoglycemia without coma: Secondary | ICD-10-CM | POA: Diagnosis not present

## 2022-07-25 DIAGNOSIS — M19029 Primary osteoarthritis, unspecified elbow: Secondary | ICD-10-CM | POA: Diagnosis not present

## 2022-07-25 DIAGNOSIS — I110 Hypertensive heart disease with heart failure: Secondary | ICD-10-CM | POA: Diagnosis not present

## 2022-07-25 DIAGNOSIS — M19042 Primary osteoarthritis, left hand: Secondary | ICD-10-CM | POA: Diagnosis not present

## 2022-07-25 DIAGNOSIS — I5022 Chronic systolic (congestive) heart failure: Secondary | ICD-10-CM | POA: Diagnosis not present

## 2022-07-25 DIAGNOSIS — M419 Scoliosis, unspecified: Secondary | ICD-10-CM | POA: Diagnosis not present

## 2022-07-25 DIAGNOSIS — F03918 Unspecified dementia, unspecified severity, with other behavioral disturbance: Secondary | ICD-10-CM | POA: Diagnosis not present

## 2022-07-25 DIAGNOSIS — M19041 Primary osteoarthritis, right hand: Secondary | ICD-10-CM | POA: Diagnosis not present

## 2022-08-05 ENCOUNTER — Telehealth: Payer: Self-pay

## 2022-08-05 NOTE — Telephone Encounter (Signed)
Focused Pharmacist Outreach  [name],[name]  86 years, Female  DOB: 1936/02/16  M: [phone]  __________________________________________________ Outreach Details Details of the Visit: Spoke to her son today :  1. She was started on a different Alzheimer's medication. 2. Suggested taking Gabapentin in the morning, but that is making her drowsiness. Now she still takes it in the evening, she is having confusion. But she is not waking up or hallucinating. Plan is to keep the Gabapentin going as usual. 3. Insulin is at 35units in the morning and evening. No other DM medicines 4. Went over all meds the patient is taking. 5. Dementia seemed to be progressing, there may be needing more therapy.  Follow up : - Check back on therapist - Is there an alternate for Gabapentin for nerve pain. Date of next Pharmacist Follow-up: 09/30/2022 . Lynann Bologna, PharmD Call and documentation 

## 2022-09-02 ENCOUNTER — Ambulatory Visit (INDEPENDENT_AMBULATORY_CARE_PROVIDER_SITE_OTHER): Payer: Medicare HMO | Admitting: Internal Medicine

## 2022-09-02 VITALS — BP 124/72 | HR 83 | Ht 60.0 in | Wt 170.0 lb

## 2022-09-02 DIAGNOSIS — I10 Essential (primary) hypertension: Secondary | ICD-10-CM

## 2022-09-02 DIAGNOSIS — F02B0 Dementia in other diseases classified elsewhere, moderate, without behavioral disturbance, psychotic disturbance, mood disturbance, and anxiety: Secondary | ICD-10-CM | POA: Diagnosis not present

## 2022-09-02 DIAGNOSIS — G301 Alzheimer's disease with late onset: Secondary | ICD-10-CM

## 2022-09-02 DIAGNOSIS — I5022 Chronic systolic (congestive) heart failure: Secondary | ICD-10-CM | POA: Diagnosis not present

## 2022-09-02 DIAGNOSIS — E1165 Type 2 diabetes mellitus with hyperglycemia: Secondary | ICD-10-CM

## 2022-09-02 DIAGNOSIS — E782 Mixed hyperlipidemia: Secondary | ICD-10-CM

## 2022-09-02 LAB — POCT CBG (FASTING - GLUCOSE)-MANUAL ENTRY: Glucose Fasting, POC: 160 mg/dL — AB (ref 70–99)

## 2022-09-03 NOTE — Progress Notes (Signed)
Established Patient Office Visit  Subjective:  Patient ID: [name], female    DOB: 24-Sep-1935  Age: 87 y.o. MRN: [id number]  Chief Complaint  Patient presents with   Follow-up    2 month follow up    Patient patient comes in for follow-up accompanied by her son and friend again.  At her last visit she was started on Namenda for her moderate dementia.  Today she is feeling and looking much better.  According to her son she is more alert and interacts appropriately.  Patient herself is quite pleasant and answered questions.  She is not complaining of any aches or pains, no chest pain, no shortness of breath.  Her blood sugars at home continue to look good.    No other concerns at this time.   Past Medical History:  Diagnosis Date   Asthma    childhood   Depression    Diabetes mellitus    Type II   Foot fracture, right 3/13   H/O scoliosis    HLD (hyperlipidemia)    HTN (hypertension)    Osteoarthritis    hands,elbow,knees   Osteoporosis    Rhinitis, allergic    Scoliosis    chronic back pain    Past Surgical History:  Procedure Laterality Date   bladder tack     CARDIAC CATHETERIZATION Right 03/19/2016   Procedure: Right/Left Heart Cath and Coronary Angiography;  Surgeon: Laurier Nancy, MD;  Location: [facility];  Service: Cardiovascular;  Laterality: Right;   TUBAL LIGATION     bilateral    Social History   Socioeconomic History   Marital status: Widowed    Spouse name: Not on file   Number of children: Not on file   Years of education: Not on file   Highest education level: Not on file  Occupational History   Not on file  Tobacco Use   Smoking status: Never   Smokeless tobacco: Never  Substance and Sexual Activity   Alcohol use: No   Drug use: No   Sexual activity: Never  Other Topics Concern   Not on file  Social History Narrative   Not on file   Social Determinants of Health   Financial Resource Strain: Not on file  Food  Insecurity: No Food Insecurity (06/05/2022)   Hunger Vital Sign    Worried About Running Out of Food in the Last Year: Never true    Ran Out of Food in the Last Year: Never true  Transportation Needs: No Transportation Needs (06/05/2022)   PRAPARE - Administrator, Civil Service (Medical): No    Lack of Transportation (Non-Medical): No  Physical Activity: Not on file  Stress: Not on file  Social Connections: Not on file  Intimate Partner Violence: Not At Risk (06/05/2022)   Humiliation, Afraid, Rape, and Kick questionnaire    Fear of Current or Ex-Partner: No    Emotionally Abused: No    Physically Abused: No    Sexually Abused: No    Family History  Problem Relation Age of Onset   Coronary artery disease Mother    Cancer Mother        thyroid   Heart attack Mother    Coronary artery disease Father    Heart attack Father    Multiple sclerosis Sister    Breast cancer Paternal Grandmother     Allergies  Allergen Reactions   Acetaminophen     REACTION: nausea   Codeine  REACTION: itching   Fluticasone Propionate     REACTION: palpitations   Fosamax [Alendronate Sodium]     Difficulty swallowing and fever/ joint pain    Lidocaine Nausea And Vomiting    Increases heart rate, N/V per pt   Naproxen     REACTION: GI   Nsaids     REACTION: elevated LFT's   Penicillins     REACTION: swelling at inj site    Review of Systems  Constitutional:  Negative for chills, diaphoresis, fever, malaise/fatigue and weight loss.  HENT:  Positive for hearing loss. Negative for ear pain, nosebleeds, sinus pain and tinnitus.   Eyes: Negative.   Respiratory:  Negative for cough, shortness of breath and wheezing.   Cardiovascular:  Negative for chest pain, palpitations and leg swelling.  Gastrointestinal:  Negative for abdominal pain, blood in stool, constipation, heartburn, melena, nausea and vomiting.  Genitourinary: Negative.   Musculoskeletal:  Negative for falls, joint  pain, myalgias and neck pain.  Neurological:  Negative for dizziness, tremors, focal weakness, weakness and headaches.  Psychiatric/Behavioral:  Negative for depression. The patient is not nervous/anxious.        Objective:   BP 124/72   Pulse 83   Ht 5' (1.524 m)   Wt 170 lb (77.1 kg)   LMP 04/22/1985   SpO2 96%   BMI 33.20 kg/m   Vitals:   09/02/22 1258  BP: 124/72  Pulse: 83  Height: 5' (1.524 m)  Weight: 170 lb (77.1 kg)  SpO2: 96%  BMI (Calculated): 33.2    Physical Exam Vitals reviewed.  Constitutional:      Appearance: Normal appearance.  Cardiovascular:     Rate and Rhythm: Normal rate and regular rhythm.     Pulses: Normal pulses.     Heart sounds: Normal heart sounds. No murmur heard. Pulmonary:     Effort: Pulmonary effort is normal.     Breath sounds: Normal breath sounds.  Abdominal:     General: Bowel sounds are normal. There is no distension.     Palpations: Abdomen is soft.  Musculoskeletal:        General: Normal range of motion.     Cervical back: Normal range of motion and neck supple.     Right lower leg: No edema.     Left lower leg: No edema.  Skin:    General: Skin is warm and dry.  Neurological:     General: No focal deficit present.     Mental Status: She is alert. Mental status is at baseline.  Psychiatric:        Mood and Affect: Mood normal.        Behavior: Behavior normal.      Results for orders placed or performed in visit on 09/02/22  POCT CBG (Fasting - Glucose)  Result Value Ref Range   Glucose Fasting, POC 160 (A) 70 - 99 mg/dL        Assessment & Plan:  Patient will continue all her medications as such.  She will get fasting blood work before her next visit. Problem List Items Addressed This Visit     Hyperlipidemia   Relevant Orders   Lipid Panel w/o Chol/HDL Ratio   Essential hypertension, benign   Relevant Orders   CBC With Differential   CMP14+EGFR   Chronic systolic congestive heart failure (HCC)    Type 2 diabetes mellitus with hyperglycemia (HCC) - Primary   Relevant Orders   POCT CBG (Fasting - Glucose) (Completed)  Hemoglobin A1c   Moderate late onset Alzheimer's dementia without behavioral disturbance, psychotic disturbance, mood disturbance, or anxiety (HCC)    Return in about 3 months (around 12/03/2022).   Total time spent: 30 minutes  Margaretann Loveless, MD  09/02/2022

## 2022-10-07 ENCOUNTER — Telehealth: Payer: Self-pay

## 2022-10-07 NOTE — Telephone Encounter (Signed)
Focused Pharmacist Outreach Details of the Visit: 10/01/22: No response @2 .22pm 10/02/22: No response @ 2.54pm 10/07/22: son answered and said she is not doing well on her dementia today and he does not have anything to talk about. touch base in 3 months Date of next Pharmacist Follow-up: 01/10/2023  Lynann Bologna 

## 2022-10-19 ENCOUNTER — Other Ambulatory Visit: Payer: Self-pay | Admitting: Internal Medicine

## 2022-10-19 ENCOUNTER — Other Ambulatory Visit: Payer: Self-pay | Admitting: Cardiovascular Disease

## 2022-10-19 DIAGNOSIS — F02B0 Dementia in other diseases classified elsewhere, moderate, without behavioral disturbance, psychotic disturbance, mood disturbance, and anxiety: Secondary | ICD-10-CM

## 2022-10-19 DIAGNOSIS — R110 Nausea: Secondary | ICD-10-CM

## 2022-11-27 ENCOUNTER — Other Ambulatory Visit: Payer: Self-pay | Admitting: Internal Medicine

## 2022-11-27 DIAGNOSIS — E1165 Type 2 diabetes mellitus with hyperglycemia: Secondary | ICD-10-CM

## 2022-12-03 ENCOUNTER — Encounter: Payer: Self-pay | Admitting: Internal Medicine

## 2022-12-03 ENCOUNTER — Ambulatory Visit: Payer: Medicare HMO | Admitting: Internal Medicine

## 2022-12-03 VITALS — BP 138/72 | HR 75 | Ht 61.0 in | Wt 171.0 lb

## 2022-12-03 DIAGNOSIS — E782 Mixed hyperlipidemia: Secondary | ICD-10-CM | POA: Diagnosis not present

## 2022-12-03 DIAGNOSIS — E1165 Type 2 diabetes mellitus with hyperglycemia: Secondary | ICD-10-CM

## 2022-12-03 DIAGNOSIS — I10 Essential (primary) hypertension: Secondary | ICD-10-CM

## 2022-12-03 LAB — POCT CBG (FASTING - GLUCOSE)-MANUAL ENTRY: Glucose Fasting, POC: 184 mg/dL — AB (ref 70–99)

## 2022-12-03 NOTE — Progress Notes (Signed)
Established Patient Office Visit  Subjective:  Patient ID: [name], female    DOB: March 08, 1936  Age: 87 y.o. MRN: [id number]  Chief Complaint  Patient presents with   Follow-up    3 month follow up    Patient comes in for her follow-up today accompanied by her friend.  She is feeling well and offers no new complaints.  She is taking all her medications and insulin regularly reportedly her fingerstick glucose is within normal limits at home.   She will get labs today, already ordered . Still waiting to get her eye exam.    No other concerns at this time.   Past Medical History:  Diagnosis Date   Asthma    childhood   Depression    Diabetes mellitus    Type II   Foot fracture, right 3/13   H/O scoliosis    HLD (hyperlipidemia)    HTN (hypertension)    Osteoarthritis    hands,elbow,knees   Osteoporosis    Rhinitis, allergic    Scoliosis    chronic back pain    Past Surgical History:  Procedure Laterality Date   bladder tack     CARDIAC CATHETERIZATION Right 03/19/2016   Procedure: Right/Left Heart Cath and Coronary Angiography;  Surgeon: Laurier Nancy, MD;  Location: [facility];  Service: Cardiovascular;  Laterality: Right;   TUBAL LIGATION     bilateral    Social History   Socioeconomic History   Marital status: Widowed    Spouse name: Not on file   Number of children: Not on file   Years of education: Not on file   Highest education level: Not on file  Occupational History   Not on file  Tobacco Use   Smoking status: Never   Smokeless tobacco: Never  Substance and Sexual Activity   Alcohol use: No   Drug use: No   Sexual activity: Never  Other Topics Concern   Not on file  Social History Narrative   Not on file   Social Determinants of Health   Financial Resource Strain: Not on file  Food Insecurity: No Food Insecurity (06/05/2022)   Hunger Vital Sign    Worried About Running Out of Food in the Last Year: Never true    Ran  Out of Food in the Last Year: Never true  Transportation Needs: No Transportation Needs (06/05/2022)   PRAPARE - Administrator, Civil Service (Medical): No    Lack of Transportation (Non-Medical): No  Physical Activity: Not on file  Stress: Not on file  Social Connections: Not on file  Intimate Partner Violence: Not At Risk (06/05/2022)   Humiliation, Afraid, Rape, and Kick questionnaire    Fear of Current or Ex-Partner: No    Emotionally Abused: No    Physically Abused: No    Sexually Abused: No    Family History  Problem Relation Age of Onset   Coronary artery disease Mother    Cancer Mother        thyroid   Heart attack Mother    Coronary artery disease Father    Heart attack Father    Multiple sclerosis Sister    Breast cancer Paternal Grandmother     Allergies  Allergen Reactions   Acetaminophen     REACTION: nausea   Codeine     REACTION: itching   Fluticasone Propionate     REACTION: palpitations   Fosamax [Alendronate Sodium]     Difficulty swallowing  and fever/ joint pain    Lidocaine Nausea And Vomiting    Increases heart rate, N/V per pt   Naproxen     REACTION: GI   Nsaids     REACTION: elevated LFT's   Penicillins     REACTION: swelling at inj site    Review of Systems  Constitutional: Negative.   HENT: Negative.    Eyes: Negative.   Respiratory: Negative.  Negative for cough and shortness of breath.   Cardiovascular: Negative.  Negative for chest pain, palpitations and leg swelling.  Gastrointestinal: Negative.  Negative for abdominal pain, constipation, diarrhea, heartburn, nausea and vomiting.  Genitourinary: Negative.  Negative for dysuria and flank pain.  Musculoskeletal: Negative.  Negative for joint pain and myalgias.  Skin: Negative.   Neurological: Negative.  Negative for dizziness and headaches.  Endo/Heme/Allergies: Negative.   Psychiatric/Behavioral: Negative.  Negative for depression and suicidal ideas. The patient is not  nervous/anxious.        Objective:   BP 138/72   Pulse 75   Ht 5\' 1"  (1.549 m)   Wt 171 lb (77.6 kg)   LMP 04/22/1985   SpO2 98%   BMI 32.31 kg/m   Vitals:   12/03/22 1254  BP: 138/72  Pulse: 75  Height: 5\' 1"  (1.549 m)  Weight: 171 lb (77.6 kg)  SpO2: 98%  BMI (Calculated): 32.33    Physical Exam Vitals and nursing note reviewed.  Constitutional:      Appearance: Normal appearance.  HENT:     Head: Normocephalic and atraumatic.     Nose: Nose normal.     Mouth/Throat:     Mouth: Mucous membranes are moist.     Pharynx: Oropharynx is clear.  Eyes:     Conjunctiva/sclera: Conjunctivae normal.     Pupils: Pupils are equal, round, and reactive to light.  Cardiovascular:     Rate and Rhythm: Normal rate and regular rhythm.     Pulses: Normal pulses.     Heart sounds: Normal heart sounds. No murmur heard. Pulmonary:     Effort: Pulmonary effort is normal.     Breath sounds: Normal breath sounds. No wheezing.  Abdominal:     General: Bowel sounds are normal.     Palpations: Abdomen is soft.     Tenderness: There is no abdominal tenderness. There is no right CVA tenderness or left CVA tenderness.  Musculoskeletal:        General: Normal range of motion.     Cervical back: Normal range of motion.     Right lower leg: No edema.     Left lower leg: No edema.  Skin:    General: Skin is warm and dry.  Neurological:     General: No focal deficit present.     Mental Status: She is alert and oriented to person, place, and time.  Psychiatric:        Mood and Affect: Mood normal.        Behavior: Behavior normal.      Results for orders placed or performed in visit on 12/03/22  POCT CBG (Fasting - Glucose)  Result Value Ref Range   Glucose Fasting, POC 184 (A) 70 - 99 mg/dL    Recent Results (from the past 2160 hour(s))  POCT CBG (Fasting - Glucose)     Status: Abnormal   Collection Time: 12/03/22 12:59 PM  Result Value Ref Range   Glucose Fasting, POC 184  (A) 70 - 99 mg/dL  Assessment & Plan:   Problem List Items Addressed This Visit     Hyperlipidemia   Essential hypertension, benign   Type 2 diabetes mellitus with hyperglycemia (HCC) - Primary   Relevant Orders   POCT CBG (Fasting - Glucose) (Completed)    Return in about 3 months (around 03/05/2023).   Total time spent: 30 minutes  Margaretann Loveless, MD  12/03/2022   This document may have been prepared by [facility] Voice Recognition software and as such may include unintentional dictation errors.

## 2022-12-05 NOTE — Progress Notes (Signed)
Patient notified

## 2022-12-13 ENCOUNTER — Other Ambulatory Visit: Payer: Self-pay | Admitting: Internal Medicine

## 2023-01-02 ENCOUNTER — Other Ambulatory Visit: Payer: Self-pay | Admitting: Internal Medicine

## 2023-01-02 DIAGNOSIS — I420 Dilated cardiomyopathy: Secondary | ICD-10-CM

## 2023-01-16 ENCOUNTER — Ambulatory Visit: Payer: Medicare HMO | Admitting: Cardiovascular Disease

## 2023-01-30 ENCOUNTER — Other Ambulatory Visit: Payer: Self-pay | Admitting: Internal Medicine

## 2023-01-30 DIAGNOSIS — R110 Nausea: Secondary | ICD-10-CM

## 2023-02-10 ENCOUNTER — Other Ambulatory Visit: Payer: Self-pay | Admitting: Internal Medicine

## 2023-02-10 DIAGNOSIS — R110 Nausea: Secondary | ICD-10-CM

## 2023-02-10 DIAGNOSIS — E1165 Type 2 diabetes mellitus with hyperglycemia: Secondary | ICD-10-CM

## 2023-03-06 ENCOUNTER — Ambulatory Visit: Payer: Medicare HMO | Admitting: Internal Medicine

## 2023-03-06 ENCOUNTER — Encounter: Payer: Self-pay | Admitting: Internal Medicine

## 2023-03-06 VITALS — BP 122/60 | HR 78 | Ht 63.0 in | Wt 169.4 lb

## 2023-03-06 DIAGNOSIS — E1159 Type 2 diabetes mellitus with other circulatory complications: Secondary | ICD-10-CM | POA: Diagnosis not present

## 2023-03-06 DIAGNOSIS — E1165 Type 2 diabetes mellitus with hyperglycemia: Secondary | ICD-10-CM | POA: Diagnosis not present

## 2023-03-06 DIAGNOSIS — Z13820 Encounter for screening for osteoporosis: Secondary | ICD-10-CM | POA: Diagnosis not present

## 2023-03-06 DIAGNOSIS — Z23 Encounter for immunization: Secondary | ICD-10-CM | POA: Diagnosis not present

## 2023-03-06 DIAGNOSIS — Z794 Long term (current) use of insulin: Secondary | ICD-10-CM | POA: Diagnosis not present

## 2023-03-06 DIAGNOSIS — D508 Other iron deficiency anemias: Secondary | ICD-10-CM

## 2023-03-06 DIAGNOSIS — E782 Mixed hyperlipidemia: Secondary | ICD-10-CM

## 2023-03-06 DIAGNOSIS — I152 Hypertension secondary to endocrine disorders: Secondary | ICD-10-CM | POA: Diagnosis not present

## 2023-03-06 DIAGNOSIS — E1169 Type 2 diabetes mellitus with other specified complication: Secondary | ICD-10-CM

## 2023-03-06 LAB — POC CREATINE & ALBUMIN,URINE
Albumin/Creatinine Ratio, Urine, POC: <30
Creatinine, POC: 100 mg/dL
Microalbumin Ur, POC: 30 mg/L

## 2023-03-06 LAB — GLUCOSE, POCT (MANUAL RESULT ENTRY): POC Glucose: 288 mg/dL — AB (ref 70–99)

## 2023-03-06 NOTE — Progress Notes (Signed)
Established Patient Office Visit  Subjective:  Patient ID: [name], female    DOB: 1935-06-03  Age: 87 y.o. MRN: [id number]  Chief Complaint  Patient presents with   Follow-up    3 mo    Patient comes in for follow-up, accompanied by her son and friend.  She reports of feeling well and has no complaints whatsoever.  She has been taking her medications regularly.  Her fingerstick glucose is high today as she had a banana sandwich for lunch.  Patient will return fasting for her blood work.  She missed her appointments for mammogram and DEXA scan, will reschedule.  Check urine microalbumin today and give flu vaccine.  Strict diet control emphasized.    No other concerns at this time.   Past Medical History:  Diagnosis Date   Asthma    childhood   Depression    Diabetes mellitus    Type II   Foot fracture, right 3/13   H/O scoliosis    HLD (hyperlipidemia)    HTN (hypertension)    Osteoarthritis    hands,elbow,knees   Osteoporosis    Rhinitis, allergic    Scoliosis    chronic back pain    Past Surgical History:  Procedure Laterality Date   bladder tack     CARDIAC CATHETERIZATION Right 03/19/2016   Procedure: Right/Left Heart Cath and Coronary Angiography;  Surgeon: Laurier Nancy, MD;  Location: [facility];  Service: Cardiovascular;  Laterality: Right;   TUBAL LIGATION     bilateral    Social History   Socioeconomic History   Marital status: Widowed    Spouse name: Not on file   Number of children: Not on file   Years of education: Not on file   Highest education level: Not on file  Occupational History   Not on file  Tobacco Use   Smoking status: Never   Smokeless tobacco: Never  Substance and Sexual Activity   Alcohol use: No   Drug use: No   Sexual activity: Never  Other Topics Concern   Not on file  Social History Narrative   Not on file   Social Determinants of Health   Financial Resource Strain: Not on file  Food  Insecurity: No Food Insecurity (06/05/2022)   Hunger Vital Sign    Worried About Running Out of Food in the Last Year: Never true    Ran Out of Food in the Last Year: Never true  Transportation Needs: No Transportation Needs (06/05/2022)   PRAPARE - Administrator, Civil Service (Medical): No    Lack of Transportation (Non-Medical): No  Physical Activity: Not on file  Stress: Not on file  Social Connections: Not on file  Intimate Partner Violence: Not At Risk (06/05/2022)   Humiliation, Afraid, Rape, and Kick questionnaire    Fear of Current or Ex-Partner: No    Emotionally Abused: No    Physically Abused: No    Sexually Abused: No    Family History  Problem Relation Age of Onset   Coronary artery disease Mother    Cancer Mother        thyroid   Heart attack Mother    Coronary artery disease Father    Heart attack Father    Multiple sclerosis Sister    Breast cancer Paternal Grandmother     Allergies  Allergen Reactions   Acetaminophen     REACTION: nausea   Codeine     REACTION: itching  Fluticasone Propionate     REACTION: palpitations   Fosamax [Alendronate Sodium]     Difficulty swallowing and fever/ joint pain    Lidocaine Nausea And Vomiting    Increases heart rate, N/V per pt   Naproxen     REACTION: GI   Nsaids     REACTION: elevated LFT's   Penicillins     REACTION: swelling at inj site    Outpatient Medications Prior to Visit  Medication Sig   ACCU-CHEK GUIDE test strip USE TO CHECK BLOOD GLUCOSE DAILY   baclofen (LIORESAL) 10 MG tablet Take 5 mg by mouth at bedtime as needed.   BD PEN NEEDLE NANO 2ND GEN 32G X 4 MM MISC USE TWICE A DAY WITH INSULIN   Blood Glucose Monitoring Suppl (ONETOUCH VERIO FLEX SYSTEM) W/DEVICE KIT 1 Device by Other route daily. Check blood sugar once daily and as directed. Dx E11.9   Calcium Carbonate-Vitamin D (CALCIUM 600+D) 600-400 MG-UNIT per tablet Take 2 tablets by mouth daily.     carvedilol (COREG) 12.5 MG  tablet TAKE 1 TABLET BY MOUTH TWICE A DAY   donepezil (ARICEPT) 10 MG tablet TAKE 1 TABLET BY MOUTH EVERY DAY   ENTRESTO 24-26 MG TAKE 1/2 (HALF) TABLET TWICE DAILY.   ezetimibe (ZETIA) 10 MG tablet TAKE 1 TABLET BY MOUTH EVERY DAY   furosemide (LASIX) 20 MG tablet TAKE 1 TABLET BY MOUTH EVERY DAY **MAKE FOLLOW UP APPT WITH DR. Diannia Hogenson FOR REFILLS**   gabapentin (NEURONTIN) 100 MG capsule Take 1 capsule (100 mg total) by mouth daily.   insulin aspart protamine - aspart (NOVOLOG MIX 70/30 FLEXPEN) (70-30) 100 UNIT/ML FlexPen INJECT 40 UNITS TWICE A DAY   memantine (NAMENDA) 5 MG tablet TAKE 1 TABLET BY MOUTH TWICE A DAY   Multiple Vitamin (MULTIVITAMIN) tablet Take 1 tablet by mouth daily.     nitroGLYCERIN (NITROSTAT) 0.4 MG SL tablet TAKE 1 TABLET UNDER TONGUE FOR CHEST PAIN. MAY REPEAT 2 TIMES MORE AT 5 MINUTE INTERVALS   ondansetron (ZOFRAN) 4 MG tablet TAKE 1 TABLET BY MOUTH EVERY 8 HOURS AS NEEDED FOR NAUSEA AND VOMITING   rosuvastatin (CRESTOR) 40 MG tablet TAKE 1 TABLET BY MOUTH EVERY DAY   Vitamin D, Ergocalciferol, (DRISDOL) 1.25 MG (50000 UNIT) CAPS capsule TAKE 1 CAPSULE BY MOUTH ONE TIME PER WEEK   [DISCONTINUED] fish oil-omega-3 fatty acids 1000 MG capsule Take 1 capsule by mouth 2 (two) times daily.  (Patient not taking: Reported on 07/15/2022)   No facility-administered medications prior to visit.    Review of Systems  Constitutional: Negative.  Negative for chills, diaphoresis, fever, malaise/fatigue and weight loss.  HENT:  Positive for hearing loss. Negative for congestion and sore throat.   Eyes: Negative.  Negative for blurred vision.  Respiratory: Negative.  Negative for cough and shortness of breath.   Cardiovascular: Negative.  Negative for chest pain, palpitations and leg swelling.  Gastrointestinal: Negative.  Negative for abdominal pain, blood in stool, constipation, diarrhea, heartburn, melena, nausea and vomiting.  Genitourinary: Negative.  Negative for dysuria and  flank pain.  Musculoskeletal: Negative.  Negative for joint pain and myalgias.  Skin: Negative.   Neurological: Negative.  Negative for dizziness, tingling, tremors and headaches.  Endo/Heme/Allergies: Negative.   Psychiatric/Behavioral: Negative.  Negative for depression and suicidal ideas. The patient is not nervous/anxious.        Objective:   BP 122/60   Pulse 78   Ht 5\' 3"  (1.6 m)   Wt 169 lb  6.4 oz (76.8 kg)   LMP 04/22/1985   SpO2 96%   BMI 30.01 kg/m   Vitals:   03/06/23 1329  BP: 122/60  Pulse: 78  Height: 5\' 3"  (1.6 m)  Weight: 169 lb 6.4 oz (76.8 kg)  SpO2: 96%  BMI (Calculated): 30.02    Physical Exam Vitals and nursing note reviewed.  Constitutional:      Appearance: Normal appearance.  HENT:     Head: Normocephalic and atraumatic.     Nose: Nose normal.     Mouth/Throat:     Mouth: Mucous membranes are moist.     Pharynx: Oropharynx is clear.  Eyes:     Conjunctiva/sclera: Conjunctivae normal.     Pupils: Pupils are equal, round, and reactive to light.  Cardiovascular:     Rate and Rhythm: Normal rate and regular rhythm.     Pulses: Normal pulses.     Heart sounds: Normal heart sounds. No murmur heard. Pulmonary:     Effort: Pulmonary effort is normal.     Breath sounds: Normal breath sounds. No wheezing.  Abdominal:     General: Bowel sounds are normal.     Palpations: Abdomen is soft.     Tenderness: There is no abdominal tenderness. There is no right CVA tenderness or left CVA tenderness.  Musculoskeletal:        General: Normal range of motion.     Cervical back: Normal range of motion.     Right lower leg: No edema.     Left lower leg: No edema.  Skin:    General: Skin is warm and dry.  Neurological:     General: No focal deficit present.     Mental Status: She is alert and oriented to person, place, and time.  Psychiatric:        Mood and Affect: Mood normal.        Behavior: Behavior normal.      Results for orders placed or  performed in visit on 03/06/23  POCT Glucose (CBG)  Result Value Ref Range   POC Glucose 288 (A) 70 - 99 mg/dl    Recent Results (from the past 2160 hour(s))  POCT Glucose (CBG)     Status: Abnormal   Collection Time: 03/06/23  1:34 PM  Result Value Ref Range   POC Glucose 288 (A) 70 - 99 mg/dl      Assessment & Plan:  Strict diet emphasized.  Return for fasting blood work. Reschedule mammogram and DEXA scan. Problem List Items Addressed This Visit     Anemia   Relevant Orders   CBC with Diff   Type 2 diabetes mellitus with hyperglycemia (HCC)   Relevant Orders   POCT Glucose (CBG) (Completed)   Hemoglobin A1c   Other Visit Diagnoses     Hypertension associated with diabetes (HCC)    -  Primary   Relevant Orders   CMP14+EGFR   Need for immunization against influenza       Relevant Orders   Flu Vaccine Trivalent High Dose (Fluad)   Combined hyperlipidemia associated with type 2 diabetes mellitus (HCC)       Relevant Orders   Lipid Panel w/o Chol/HDL Ratio   Screening for osteoporosis       Relevant Orders   DG Bone Density       Return in about 4 months (around 07/04/2023).   Total time spent: 30 minutes  Margaretann Loveless, MD  03/06/2023   This document may have been  prepared by [employer] and as such may include unintentional dictation errors.

## 2023-03-06 NOTE — Addendum Note (Signed)
Addended by: Feliberto Harts on: 03/06/2023 04:15 PM   Modules accepted: Orders

## 2023-03-11 ENCOUNTER — Ambulatory Visit (INDEPENDENT_AMBULATORY_CARE_PROVIDER_SITE_OTHER): Payer: Medicare HMO

## 2023-03-11 DIAGNOSIS — Z13820 Encounter for screening for osteoporosis: Secondary | ICD-10-CM

## 2023-03-18 ENCOUNTER — Other Ambulatory Visit: Payer: Self-pay | Admitting: Family

## 2023-03-18 ENCOUNTER — Other Ambulatory Visit: Payer: Self-pay | Admitting: Internal Medicine

## 2023-03-18 DIAGNOSIS — E782 Mixed hyperlipidemia: Secondary | ICD-10-CM

## 2023-03-18 DIAGNOSIS — E1165 Type 2 diabetes mellitus with hyperglycemia: Secondary | ICD-10-CM

## 2023-03-18 DIAGNOSIS — F0280 Dementia in other diseases classified elsewhere without behavioral disturbance: Secondary | ICD-10-CM

## 2023-03-25 ENCOUNTER — Other Ambulatory Visit: Payer: Medicare HMO

## 2023-05-21 ENCOUNTER — Other Ambulatory Visit: Payer: Self-pay

## 2023-05-21 ENCOUNTER — Emergency Department: Payer: Medicare HMO

## 2023-05-21 ENCOUNTER — Emergency Department
Admission: EM | Admit: 2023-05-21 | Discharge: 2023-05-21 | Disposition: A | Discharge status: Home / Self Care | Payer: Medicare HMO | Attending: Student in an Organized Health Care Education/Training Program | Admitting: Student in an Organized Health Care Education/Training Program

## 2023-05-21 DIAGNOSIS — F29 Unspecified psychosis not due to a substance or known physiological condition: Secondary | ICD-10-CM | POA: Diagnosis not present

## 2023-05-21 DIAGNOSIS — R55 Syncope and collapse: Secondary | ICD-10-CM | POA: Diagnosis not present

## 2023-05-21 DIAGNOSIS — F0390 Unspecified dementia without behavioral disturbance: Secondary | ICD-10-CM | POA: Insufficient documentation

## 2023-05-21 DIAGNOSIS — R4182 Altered mental status, unspecified: Secondary | ICD-10-CM | POA: Diagnosis not present

## 2023-05-21 DIAGNOSIS — I10 Essential (primary) hypertension: Secondary | ICD-10-CM | POA: Diagnosis not present

## 2023-05-21 LAB — URINALYSIS, ROUTINE W REFLEX MICROSCOPIC
Bilirubin Urine: NEGATIVE
Glucose, UA: 150 mg/dL — AB
Hgb urine dipstick: NEGATIVE
Ketones, ur: NEGATIVE mg/dL
Leukocytes,Ua: NEGATIVE
Nitrite: NEGATIVE
Protein, ur: NEGATIVE mg/dL
Specific Gravity, Urine: 1.011 (ref 1.005–1.030)
pH: 5 (ref 5.0–8.0)

## 2023-05-21 LAB — COMPREHENSIVE METABOLIC PANEL WITH GFR
ALT: 39 U/L (ref 0–44)
AST: 31 U/L (ref 15–41)
Albumin: 4 g/dL (ref 3.5–5.0)
Alkaline Phosphatase: 48 U/L (ref 38–126)
Anion gap: 10 (ref 5–15)
BUN: 22 mg/dL (ref 8–23)
CO2: 28 mmol/L (ref 22–32)
Calcium: 9.9 mg/dL (ref 8.9–10.3)
Chloride: 101 mmol/L (ref 98–111)
Creatinine, Ser: 1.2 mg/dL — ABNORMAL HIGH (ref 0.44–1.00)
GFR, Estimated: 44 mL/min — ABNORMAL LOW
Glucose, Bld: 199 mg/dL — ABNORMAL HIGH (ref 70–99)
Potassium: 4.1 mmol/L (ref 3.5–5.1)
Sodium: 139 mmol/L (ref 135–145)
Total Bilirubin: 0.8 mg/dL (ref 0.0–1.2)
Total Protein: 7.7 g/dL (ref 6.5–8.1)

## 2023-05-21 LAB — CBG MONITORING, ED
Glucose-Capillary: 179 mg/dL — ABNORMAL HIGH (ref 70–99)
Glucose-Capillary: 276 mg/dL — ABNORMAL HIGH (ref 70–99)

## 2023-05-21 LAB — CBC
HCT: 37.4 % (ref 36.0–46.0)
Hemoglobin: 12.5 g/dL (ref 12.0–15.0)
MCH: 30.3 pg (ref 26.0–34.0)
MCHC: 33.4 g/dL (ref 30.0–36.0)
MCV: 90.6 fL (ref 80.0–100.0)
Platelets: 207 10*3/uL (ref 150–400)
RBC: 4.13 MIL/uL (ref 3.87–5.11)
RDW: 13.3 % (ref 11.5–15.5)
WBC: 8.3 10*3/uL (ref 4.0–10.5)
nRBC: 0 % (ref 0.0–0.2)

## 2023-05-21 LAB — TROPONIN I (HIGH SENSITIVITY): Troponin I (High Sensitivity): 6 ng/L (ref <18)

## 2023-05-21 MED ORDER — INSULIN ASPART PROT & ASPART (70-30 MIX) 100 UNIT/ML ~~LOC~~ SUSP: 30.0000 [IU] | Freq: Once | SUBCUTANEOUS | Status: DC

## 2023-05-21 NOTE — ED Triage Notes (Signed)
Pt here with AMS . Family states she was not responsive this morning, 8-9am. Family states she has a hx of dementia. Pt denies recent falls. Pt talking in triage but still confused. Pt LKW was 2000.

## 2023-05-21 NOTE — ED Provider Notes (Signed)
[facility] Provider Note    Event Date/Time   First MD Initiated Contact with Patient 05/21/23 1612     (approximate)   History   Altered Mental Status   HPI  SHELAH HEATLEY is a 88 y.o. female presents to the ER for evaluation of episode of unresponsiveness this morning around 8 or 9:00 lasting a few minutes.  Patient was otherwise acting normal.  Does have progressive dementia.  Family reports that patient was laying in bed with her eyes closed and was not answering voice.  With sternal rub she did open her eyes and look at the son but then closed her eyes again and was not waking up.  EMS was called.  When they arrived again they sternal rub the patient and she woke up was initially pushing them off and not wanting to go to the hospital.  She denies any symptoms no pain.  No headache.  No numbness or tingling.     Physical Exam   Triage Vital Signs: ED Triage Vitals  Encounter Vitals Group     BP 05/21/23 1102 (!) 132/59     Systolic BP Percentile --      Diastolic BP Percentile --      Pulse Rate 05/21/23 1102 78     Resp 05/21/23 1102 17     Temp 05/21/23 1102 98.1 F (36.7 C)     Temp Source 05/21/23 1102 Oral     SpO2 05/21/23 1102 100 %     Weight 05/21/23 1104 169 lb 5 oz (76.8 kg)     Height 05/21/23 1104 5\' 3"  (1.6 m)     Head Circumference --      Peak Flow --      Pain Score 05/21/23 1103 0     Pain Loc --      Pain Education --      Exclude from Growth Chart --     Most recent vital signs: Vitals:   05/21/23 1102 05/21/23 1559  BP: (!) 132/59 (!) 137/53  Pulse: 78 82  Resp: 17 18  Temp: 98.1 F (36.7 C) 98.4 F (36.9 C)  SpO2: 100% 100%     Constitutional: Alert  Eyes: Conjunctivae are normal.  Head: Atraumatic. Nose: No congestion/rhinnorhea. Mouth/Throat: Mucous membranes are moist.   Neck: Painless ROM.  Cardiovascular:   Good peripheral circulation. Respiratory: Normal respiratory effort.  No retractions.   Gastrointestinal: Soft and nontender.  Musculoskeletal:  no deformity Neurologic:  MAE spontaneously. No gross focal neurologic deficits are appreciated.  Skin:  Skin is warm, dry and intact. No rash noted. Psychiatric: calm and cooperative    ED Results / Procedures / Treatments   Labs (all labs ordered are listed, but only abnormal results are displayed) Labs Reviewed  COMPREHENSIVE METABOLIC PANEL - Abnormal; Notable for the following components:      Result Value   Glucose, Bld 199 (*)    Creatinine, Ser 1.20 (*)    GFR, Estimated 44 (*)    All other components within normal limits  URINALYSIS, ROUTINE W REFLEX MICROSCOPIC - Abnormal; Notable for the following components:   Color, Urine YELLOW (*)    APPearance CLEAR (*)    Glucose, UA 150 (*)    All other components within normal limits  CBG MONITORING, ED - Abnormal; Notable for the following components:   Glucose-Capillary 179 (*)    All other components within normal limits  CBG MONITORING, ED - Abnormal; Notable for  the following components:   Glucose-Capillary 276 (*)    All other components within normal limits  CBC  TROPONIN I (HIGH SENSITIVITY)     EKG  ED ECG REPORT I, Willy Eddy, the attending physician, personally viewed and interpreted this ECG.   Date: 05/21/2023  EKG Time: 11:06  Rate: 75  Rhythm: sinus  Axis: left  Intervals:lbbb  ST&T Change: no sgarbossa    RADIOLOGY Please see ED Course for my review and interpretation.  I personally reviewed all radiographic images ordered to evaluate for the above acute complaints and reviewed radiology reports and findings.  These findings were personally discussed with the patient.  Please see medical record for radiology report.    PROCEDURES:  Critical Care performed: No  Procedures   MEDICATIONS ORDERED IN ED: Medications - No data to display   IMPRESSION / MDM / ASSESSMENT AND PLAN / ED COURSE  I reviewed the triage vital  signs and the nursing notes.                              Differential diagnosis includes, but is not limited to, Dehydration, sepsis, pna, uti, hypoglycemia, cva, drug effect, withdrawal, encephalitis  Patient presenting to the ER for evaluation of symptoms as described above.  Based on symptoms, risk factors and considered above differential, this presenting complaint could reflect a potentially life-threatening illness therefore the patient will be placed on continuous pulse oximetry and telemetry for monitoring.  Laboratory evaluation will be sent to evaluate for the above complaints.      Clinical Course as of 05/21/23 1839  Wed May 21, 2023  1729 Chest x-ray on my review and interpretation with questionable small left lower lobe effusion. [PR]  1834 Troponin negative.  UA without evidence of infection.  No acute findings on imaging per radiology.  Patient at her baseline.  Repeat CBG mildly elevated and family is requesting her evening dose of 30 units of 70/30 insulin.  No sign of DKA.  Discussed option for observation the hospital for further management.  Patient family requesting discharge home if she has worsening of her dementia went away from her house.  They have requested referral for home health which I have placed a face-to-face order in epic.  They seem a very reliable caregivers and we discussed signs and symptoms which should return to the ER. [PR]    Clinical Course User Index [PR] Willy Eddy, MD     FINAL CLINICAL IMPRESSION(S) / ED DIAGNOSES   Final diagnoses:  Altered mental status, unspecified altered mental status type     Rx / DC Orders   ED Discharge Orders     None        Note:  This document was prepared using Dragon voice recognition software and may include unintentional dictation errors.    Willy Eddy, MD 05/21/23 [phone]

## 2023-05-21 NOTE — ED Triage Notes (Signed)
First Nurse Note: Patient to ED via [facility] from home for AMS. Family states she was difficult to wake up this AM. Hx of dementia. Aox2 at baseline and is currently at baseline.   146/70 79 HR 97% RA 162 cbg 97.3

## 2023-05-21 NOTE — ED Notes (Signed)
Family to desk multiple time stating they need a "time frame" due to patient having dementia and unable to wait for hours. This RN explained that the hospital has no open rooms at this time and we are doing the best we can to get people seen. Family member states he will be "making phone calls to make something happen."

## 2023-05-21 NOTE — ED Notes (Signed)
This RN spoke to pt's son who was angry about wait time-admissions and reasoning explained to pt, son states "I was told there is empty beds back there and if I go back there and there is empty beds, Im going to to call you out".

## 2023-05-21 NOTE — ED Notes (Signed)
Patient family to desk asking regarding wait time. Pt's family told unable we are unable to give a wait time at this time. Family- Devaeh Amadi asking to speaking with a supervisor. Charger RN made aware at this time.

## 2023-05-21 NOTE — ED Provider Triage Note (Signed)
Emergency Medicine Provider Triage Evaluation Note  [name] , a 88 y.o. female  was evaluated in triage.  Pt complains of AMS, has been states that she was normal last night before she went to bed, does have history of dementia, no weakness, facial droop or slurred speech.  Review of Systems  Positive:  Negative:   Physical Exam  BP (!) 132/59   Pulse 78   Temp 98.1 F (36.7 C) (Oral)   Resp 17   Ht 5\' 3"  (1.6 m)   Wt 76.8 kg   LMP 04/22/1985   SpO2 100%   BMI 29.99 kg/m  Gen:   Awake, no distress   Resp:  Normal effort  MSK:   Moves extremities without difficulty  Other:    Medical Decision Making  Medically screening exam initiated at 11:06 AM.  Appropriate orders placed.  [name] was informed that the remainder of the evaluation will be completed by another provider, this initial triage assessment does not replace that evaluation, and the importance of remaining in the ED until their evaluation is complete.  Altered mental status protocol started, added head CT due to mental status change   Faythe Ghee, PA-C 05/21/23 1107

## 2023-05-27 ENCOUNTER — Encounter: Payer: Self-pay | Admitting: Internal Medicine

## 2023-05-27 ENCOUNTER — Other Ambulatory Visit: Payer: Self-pay | Admitting: Internal Medicine

## 2023-05-27 ENCOUNTER — Ambulatory Visit (INDEPENDENT_AMBULATORY_CARE_PROVIDER_SITE_OTHER): Payer: Medicare HMO | Admitting: Internal Medicine

## 2023-05-27 VITALS — BP 110/60 | HR 75 | Ht 61.0 in

## 2023-05-27 DIAGNOSIS — E1142 Type 2 diabetes mellitus with diabetic polyneuropathy: Secondary | ICD-10-CM

## 2023-05-27 DIAGNOSIS — G301 Alzheimer's disease with late onset: Secondary | ICD-10-CM

## 2023-05-27 DIAGNOSIS — Z794 Long term (current) use of insulin: Secondary | ICD-10-CM | POA: Diagnosis not present

## 2023-05-27 DIAGNOSIS — F02B11 Dementia in other diseases classified elsewhere, moderate, with agitation: Secondary | ICD-10-CM

## 2023-05-27 DIAGNOSIS — F0280 Dementia in other diseases classified elsewhere without behavioral disturbance: Secondary | ICD-10-CM

## 2023-05-27 DIAGNOSIS — I10 Essential (primary) hypertension: Secondary | ICD-10-CM | POA: Diagnosis not present

## 2023-05-27 DIAGNOSIS — G309 Alzheimer's disease, unspecified: Secondary | ICD-10-CM

## 2023-05-27 DIAGNOSIS — E1169 Type 2 diabetes mellitus with other specified complication: Secondary | ICD-10-CM | POA: Insufficient documentation

## 2023-05-27 DIAGNOSIS — E1165 Type 2 diabetes mellitus with hyperglycemia: Secondary | ICD-10-CM | POA: Diagnosis not present

## 2023-05-27 DIAGNOSIS — E782 Mixed hyperlipidemia: Secondary | ICD-10-CM | POA: Diagnosis not present

## 2023-05-27 DIAGNOSIS — E1159 Type 2 diabetes mellitus with other circulatory complications: Secondary | ICD-10-CM

## 2023-05-27 LAB — POCT CBG (FASTING - GLUCOSE)-MANUAL ENTRY: Glucose Fasting, POC: 275 mg/dL — AB (ref 70–99)

## 2023-05-27 MED ORDER — FREESTYLE LIBRE 14 DAY SENSOR MISC: 1.0000 | 6 refills | Status: DC

## 2023-05-27 NOTE — Progress Notes (Signed)
 Established Patient Office Visit  Subjective:  Patient ID: [name], female    DOB: [birth year]  Age: 88 y.o. MRN: [id number]  Chief Complaint  Patient presents with   Hospitalization Follow-up    Patient comes in for her post ED visit on 05/21/2023.  She is in a wheelchair ,accompanied by her son and friend.  She was taken to the emergency room with altered mental status.  Her workup was entirely negative and the diagnosis was made of worsening dementia.  Family reports that some days are better than the others and she is more interactive.  But mostly she remains quiet and has trouble  using the right words.  Also can get agitated at times.  She is currently on Aricept 10 mg and Namenda  5 mg/day.  Neurology appointment has been made.  Today patient is sitting comfortably, pleasantly confused but does not remember the names including her son.  She is able to ambulate at home but briefly with the help of her walker.  Her appetite is stable.  Will set up home health services along with PT.    No other concerns at this time.   Past Medical History:  Diagnosis Date   Asthma    childhood   Depression    Diabetes mellitus    Type II   Foot fracture, right 3/13   H/O scoliosis    HLD (hyperlipidemia)    HTN (hypertension)    Osteoarthritis    hands,elbow,knees   Osteoporosis    Rhinitis, allergic    Scoliosis    chronic back pain    Past Surgical History:  Procedure Laterality Date   bladder tack     CARDIAC CATHETERIZATION Right 03/19/2016   Procedure: Right/Left Heart Cath and Coronary Angiography;  Surgeon: Denyse DELENA Bathe, MD;  Location: [facility] INVASIVE CV LAB;  Service: Cardiovascular;  Laterality: Right;   TUBAL LIGATION     bilateral    Social History   Socioeconomic History   Marital status: Widowed    Spouse name: Not on file   Number of children: Not on file   Years of education: Not on file   Highest education level: Not on file  Occupational History    Not on file  Tobacco Use   Smoking status: Never   Smokeless tobacco: Never  Substance and Sexual Activity   Alcohol use: No   Drug use: No   Sexual activity: Never  Other Topics Concern   Not on file  Social History Narrative   Not on file   Social Drivers of Health   Financial Resource Strain: Not on file  Food Insecurity: No Food Insecurity (06/05/2022)   Hunger Vital Sign    Worried About Running Out of Food in the Last Year: Never true    Ran Out of Food in the Last Year: Never true  Transportation Needs: No Transportation Needs (06/05/2022)   PRAPARE - Administrator, Civil Service (Medical): No    Lack of Transportation (Non-Medical): No  Physical Activity: Not on file  Stress: Not on file  Social Connections: Not on file  Intimate Partner Violence: Not At Risk (06/05/2022)   Humiliation, Afraid, Rape, and Kick questionnaire    Fear of Current or Ex-Partner: No    Emotionally Abused: No    Physically Abused: No    Sexually Abused: No    Family History  Problem Relation Age of Onset   Coronary artery disease Mother    Cancer  Mother        thyroid    Heart attack Mother    Coronary artery disease Father    Heart attack Father    Multiple sclerosis Sister    Breast cancer Paternal Grandmother     Allergies  Allergen Reactions   Acetaminophen      REACTION: nausea   Codeine     REACTION: itching   Fluticasone  Propionate     REACTION: palpitations   Fosamax  [Alendronate  Sodium]     Difficulty swallowing and fever/ joint pain    Lidocaine Nausea And Vomiting    Increases heart rate, N/V per pt   Naproxen     REACTION: GI   Nsaids     REACTION: elevated LFT's   Penicillins     REACTION: swelling at inj site    Outpatient Medications Prior to Visit  Medication Sig   ACCU-CHEK GUIDE test strip USE TO CHECK BLOOD GLUCOSE DAILY   baclofen (LIORESAL) 10 MG tablet Take 5 mg by mouth at bedtime as needed.   BD PEN NEEDLE NANO 2ND GEN 32G X 4 MM  MISC USE TWICE A DAY WITH INSULIN    Calcium  Carbonate-Vitamin D  (CALCIUM  600+D) 600-400 MG-UNIT per tablet Take 2 tablets by mouth daily.     carvedilol  (COREG ) 12.5 MG tablet TAKE 1 TABLET BY MOUTH TWICE A DAY   donepezil (ARICEPT) 10 MG tablet TAKE 1 TABLET BY MOUTH EVERY DAY   ENTRESTO  24-26 MG TAKE 1/2 (HALF) TABLET TWICE DAILY.   ezetimibe (ZETIA) 10 MG tablet TAKE 1 TABLET BY MOUTH EVERY DAY   furosemide  (LASIX ) 20 MG tablet TAKE 1 TABLET BY MOUTH EVERY DAY **MAKE FOLLOW UP APPT WITH DR. Kalliope Riesen FOR REFILLS**   gabapentin  (NEURONTIN ) 100 MG capsule Take 1 capsule (100 mg total) by mouth daily.   memantine  (NAMENDA ) 5 MG tablet TAKE 1 TABLET BY MOUTH TWICE A DAY   Multiple Vitamin (MULTIVITAMIN) tablet Take 1 tablet by mouth daily.     NOVOLOG  MIX 70/30 FLEXPEN (70-30) 100 UNIT/ML FlexPen INJECT 40 UNITS TWICE A DAY   ondansetron  (ZOFRAN ) 4 MG tablet TAKE 1 TABLET BY MOUTH EVERY 8 HOURS AS NEEDED FOR NAUSEA AND VOMITING   rosuvastatin  (CRESTOR ) 40 MG tablet TAKE 1 TABLET BY MOUTH EVERY DAY   Vitamin D , Ergocalciferol , (DRISDOL) 1.25 MG (50000 UNIT) CAPS capsule TAKE 1 CAPSULE BY MOUTH ONE TIME PER WEEK   Blood Glucose Monitoring Suppl (ONETOUCH VERIO FLEX SYSTEM) W/DEVICE KIT 1 Device by Other route daily. Check blood sugar once daily and as directed. Dx E11.9 (Patient not taking: Reported on 05/27/2023)   nitroGLYCERIN  (NITROSTAT ) 0.4 MG SL tablet TAKE 1 TABLET UNDER TONGUE FOR CHEST PAIN. MAY REPEAT 2 TIMES MORE AT 5 MINUTE INTERVALS (Patient not taking: Reported on 05/27/2023)   No facility-administered medications prior to visit.    Review of Systems  Constitutional: Negative.  Negative for chills, fever, malaise/fatigue and weight loss.  HENT: Negative.    Eyes: Negative.   Respiratory: Negative.  Negative for cough and shortness of breath.   Cardiovascular: Negative.  Negative for chest pain, palpitations and leg swelling.  Gastrointestinal: Negative.  Negative for abdominal pain,  constipation, diarrhea, heartburn, nausea and vomiting.  Genitourinary: Negative.  Negative for dysuria and flank pain.  Musculoskeletal: Negative.  Negative for joint pain and myalgias.  Skin: Negative.   Neurological: Negative.  Negative for dizziness and headaches.  Endo/Heme/Allergies: Negative.   Psychiatric/Behavioral: Negative.  Negative for depression and suicidal ideas. The patient is not  nervous/anxious.        Objective:   BP 110/60   Pulse 75   Ht 5' 1 (1.549 m)   LMP 04/22/1985   SpO2 96%   BMI 31.99 kg/m   Vitals:   05/27/23 1037  BP: 110/60  Pulse: 75  Height: 5' 1 (1.549 m)  Weight: Comment: unable to weigh  SpO2: 96%    Physical Exam Vitals and nursing note reviewed.  Constitutional:      Appearance: Normal appearance.  HENT:     Head: Normocephalic and atraumatic.     Nose: Nose normal.     Mouth/Throat:     Mouth: Mucous membranes are moist.     Pharynx: Oropharynx is clear.  Eyes:     Conjunctiva/sclera: Conjunctivae normal.     Pupils: Pupils are equal, round, and reactive to light.  Cardiovascular:     Rate and Rhythm: Normal rate and regular rhythm.     Pulses: Normal pulses.     Heart sounds: Normal heart sounds. No murmur heard. Pulmonary:     Effort: Pulmonary effort is normal.     Breath sounds: Normal breath sounds. No wheezing.  Abdominal:     General: Bowel sounds are normal.     Palpations: Abdomen is soft.     Tenderness: There is no abdominal tenderness. There is no right CVA tenderness or left CVA tenderness.  Musculoskeletal:        General: Normal range of motion.     Cervical back: Normal range of motion.     Right lower leg: No edema.     Left lower leg: No edema.  Skin:    General: Skin is warm and dry.  Neurological:     General: No focal deficit present.     Mental Status: She is alert. She is disoriented.      Results for orders placed or performed in visit on 05/27/23  POCT CBG (Fasting - Glucose)   Result Value Ref Range   Glucose Fasting, POC 275 (A) 70 - 99 mg/dL    Recent Results (from the past 2160 hours)  POCT Glucose (CBG)     Status: Abnormal   Collection Time: 03/06/23  1:34 PM  Result Value Ref Range   POC Glucose 288 (A) 70 - 99 mg/dl  POC CREATINE & ALBUMIN,URINE     Status: Normal   Collection Time: 03/06/23  4:14 PM  Result Value Ref Range   Microalbumin Ur, POC 30 mg/L   Creatinine, POC 100 mg/dL   Albumin/Creatinine Ratio, Urine, POC <30   CBG monitoring, ED     Status: Abnormal   Collection Time: 05/21/23 11:08 AM  Result Value Ref Range   Glucose-Capillary 179 (H) 70 - 99 mg/dL    Comment: Glucose reference range applies only to samples taken after fasting for at least 8 hours.  Troponin I (High Sensitivity)     Status: None   Collection Time: 05/21/23 11:12 AM  Result Value Ref Range   Troponin I (High Sensitivity) 6 <18 ng/L    Comment: (NOTE) Elevated high sensitivity troponin I (hsTnI) values and significant  changes across serial measurements may suggest ACS but many other  chronic and acute conditions are known to elevate hsTnI results.  Refer to the Links section for chest pain algorithms and additional  guidance. Performed at [facility], [address], [place], [place] [address]   Comprehensive metabolic panel     Status: Abnormal   Collection Time:  05/21/23 11:13 AM  Result Value Ref Range   Sodium 139 135 - 145 mmol/L   Potassium 4.1 3.5 - 5.1 mmol/L   Chloride 101 98 - 111 mmol/L   CO2 28 22 - 32 mmol/L   Glucose, Bld 199 (H) 70 - 99 mg/dL    Comment: Glucose reference range applies only to samples taken after fasting for at least 8 hours.   BUN 22 8 - 23 mg/dL   Creatinine, Ser 8.79 (H) 0.44 - 1.00 mg/dL   Calcium  9.9 8.9 - 10.3 mg/dL   Total Protein 7.7 6.5 - 8.1 g/dL   Albumin 4.0 3.5 - 5.0 g/dL   AST 31 15 - 41 U/L   ALT 39 0 - 44 U/L   Alkaline Phosphatase 48 38 - 126 U/L   Total Bilirubin 0.8 0.0 - 1.2 mg/dL    GFR, Estimated 44 (L) >60 mL/min    Comment: (NOTE) Calculated using the CKD-EPI Creatinine Equation (2021)    Anion gap 10 5 - 15    Comment: Performed at [facility], [address] [place], [place] [address]  CBC     Status: None   Collection Time: 05/21/23 11:13 AM  Result Value Ref Range   WBC 8.3 4.0 - 10.5 K/uL   RBC 4.13 3.87 - 5.11 MIL/uL   Hemoglobin 12.5 12.0 - 15.0 g/dL   HCT 62.5 63.9 - 53.9 %   MCV 90.6 80.0 - 100.0 fL   MCH 30.3 26.0 - 34.0 pg   MCHC 33.4 30.0 - 36.0 g/dL   RDW 86.6 88.4 - 84.4 %   Platelets 207 150 - 400 K/uL   nRBC 0.0 0.0 - 0.2 %    Comment: Performed at [facility], [address] [place], [place] [address]  Urinalysis, Routine w reflex microscopic -Urine, Clean Catch     Status: Abnormal   Collection Time: 05/21/23  4:57 PM  Result Value Ref Range   Color, Urine YELLOW (A) YELLOW   APPearance CLEAR (A) CLEAR   Specific Gravity, Urine 1.011 1.005 - 1.030   pH 5.0 5.0 - 8.0   Glucose, UA 150 (A) NEGATIVE mg/dL   Hgb urine dipstick NEGATIVE NEGATIVE   Bilirubin Urine NEGATIVE NEGATIVE   Ketones, ur NEGATIVE NEGATIVE mg/dL   Protein, ur NEGATIVE NEGATIVE mg/dL   Nitrite NEGATIVE NEGATIVE   Leukocytes,Ua NEGATIVE NEGATIVE    Comment: Performed at [facility], [address] [place], [place] [address]  POC CBG, ED     Status: Abnormal   Collection Time: 05/21/23  6:20 PM  Result Value Ref Range   Glucose-Capillary 276 (H) 70 - 99 mg/dL    Comment: Glucose reference range applies only to samples taken after fasting for at least 8 hours.   Comment 1 Notify RN    Comment 2 Document in Chart   POCT CBG (Fasting - Glucose)     Status: Abnormal   Collection Time: 05/27/23 10:42 AM  Result Value Ref Range   Glucose Fasting, POC 275 (A) 70 - 99 mg/dL      Assessment & Plan:  Continue current medications. Set up home health services with physical therapy. 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   Total time spent: 30 minutes  FERNAND FREDY RAMAN, MD  05/27/2023   This document may have been prepared by [facility] Voice Recognition software and as such may include unintentional dictation errors.

## 2023-05-28 ENCOUNTER — Other Ambulatory Visit: Payer: Self-pay | Admitting: Internal Medicine

## 2023-05-28 ENCOUNTER — Other Ambulatory Visit: Payer: Self-pay | Admitting: Family

## 2023-05-28 DIAGNOSIS — F02B0 Dementia in other diseases classified elsewhere, moderate, without behavioral disturbance, psychotic disturbance, mood disturbance, and anxiety: Secondary | ICD-10-CM

## 2023-05-28 DIAGNOSIS — R110 Nausea: Secondary | ICD-10-CM

## 2023-05-28 LAB — LIPID PANEL W/O CHOL/HDL RATIO
Cholesterol, Total: 89 mg/dL — ABNORMAL LOW (ref 100–199)
HDL: 44 mg/dL (ref >39)
LDL Chol Calc (NIH): 23 mg/dL (ref 0–99)
Triglycerides: 123 mg/dL (ref 0–149)
VLDL Cholesterol Cal: 22 mg/dL (ref 5–40)

## 2023-05-28 LAB — HEMOGLOBIN A1C
Est. average glucose Bld gHb Est-mCnc: 260 mg/dL
Hgb A1c MFr Bld: 10.7 % — ABNORMAL HIGH (ref 4.8–5.6)

## 2023-06-02 DIAGNOSIS — F32A Depression, unspecified: Secondary | ICD-10-CM | POA: Diagnosis not present

## 2023-06-02 DIAGNOSIS — F02B3 Dementia in other diseases classified elsewhere, moderate, with mood disturbance: Secondary | ICD-10-CM | POA: Diagnosis not present

## 2023-06-02 DIAGNOSIS — E1159 Type 2 diabetes mellitus with other circulatory complications: Secondary | ICD-10-CM | POA: Diagnosis not present

## 2023-06-02 DIAGNOSIS — F02B11 Dementia in other diseases classified elsewhere, moderate, with agitation: Secondary | ICD-10-CM | POA: Diagnosis not present

## 2023-06-02 DIAGNOSIS — E1169 Type 2 diabetes mellitus with other specified complication: Secondary | ICD-10-CM | POA: Diagnosis not present

## 2023-06-02 DIAGNOSIS — I152 Hypertension secondary to endocrine disorders: Secondary | ICD-10-CM | POA: Diagnosis not present

## 2023-06-02 DIAGNOSIS — G301 Alzheimer's disease with late onset: Secondary | ICD-10-CM | POA: Diagnosis not present

## 2023-06-02 DIAGNOSIS — E782 Mixed hyperlipidemia: Secondary | ICD-10-CM | POA: Diagnosis not present

## 2023-06-02 DIAGNOSIS — E1165 Type 2 diabetes mellitus with hyperglycemia: Secondary | ICD-10-CM | POA: Diagnosis not present

## 2023-06-04 DIAGNOSIS — F02B11 Dementia in other diseases classified elsewhere, moderate, with agitation: Secondary | ICD-10-CM | POA: Diagnosis not present

## 2023-06-04 DIAGNOSIS — E1159 Type 2 diabetes mellitus with other circulatory complications: Secondary | ICD-10-CM | POA: Diagnosis not present

## 2023-06-04 DIAGNOSIS — F02B3 Dementia in other diseases classified elsewhere, moderate, with mood disturbance: Secondary | ICD-10-CM | POA: Diagnosis not present

## 2023-06-04 DIAGNOSIS — I152 Hypertension secondary to endocrine disorders: Secondary | ICD-10-CM | POA: Diagnosis not present

## 2023-06-04 DIAGNOSIS — G301 Alzheimer's disease with late onset: Secondary | ICD-10-CM | POA: Diagnosis not present

## 2023-06-04 DIAGNOSIS — E1169 Type 2 diabetes mellitus with other specified complication: Secondary | ICD-10-CM | POA: Diagnosis not present

## 2023-06-04 DIAGNOSIS — F32A Depression, unspecified: Secondary | ICD-10-CM | POA: Diagnosis not present

## 2023-06-04 DIAGNOSIS — E1165 Type 2 diabetes mellitus with hyperglycemia: Secondary | ICD-10-CM | POA: Diagnosis not present

## 2023-06-04 DIAGNOSIS — E782 Mixed hyperlipidemia: Secondary | ICD-10-CM | POA: Diagnosis not present

## 2023-06-10 DIAGNOSIS — F02B3 Dementia in other diseases classified elsewhere, moderate, with mood disturbance: Secondary | ICD-10-CM | POA: Diagnosis not present

## 2023-06-10 DIAGNOSIS — E1169 Type 2 diabetes mellitus with other specified complication: Secondary | ICD-10-CM | POA: Diagnosis not present

## 2023-06-10 DIAGNOSIS — E1159 Type 2 diabetes mellitus with other circulatory complications: Secondary | ICD-10-CM | POA: Diagnosis not present

## 2023-06-10 DIAGNOSIS — F32A Depression, unspecified: Secondary | ICD-10-CM | POA: Diagnosis not present

## 2023-06-10 DIAGNOSIS — F02B11 Dementia in other diseases classified elsewhere, moderate, with agitation: Secondary | ICD-10-CM | POA: Diagnosis not present

## 2023-06-10 DIAGNOSIS — G301 Alzheimer's disease with late onset: Secondary | ICD-10-CM | POA: Diagnosis not present

## 2023-06-10 DIAGNOSIS — E1165 Type 2 diabetes mellitus with hyperglycemia: Secondary | ICD-10-CM | POA: Diagnosis not present

## 2023-06-10 DIAGNOSIS — I152 Hypertension secondary to endocrine disorders: Secondary | ICD-10-CM | POA: Diagnosis not present

## 2023-06-10 DIAGNOSIS — E782 Mixed hyperlipidemia: Secondary | ICD-10-CM | POA: Diagnosis not present

## 2023-06-14 DIAGNOSIS — E782 Mixed hyperlipidemia: Secondary | ICD-10-CM | POA: Diagnosis not present

## 2023-06-14 DIAGNOSIS — E1159 Type 2 diabetes mellitus with other circulatory complications: Secondary | ICD-10-CM | POA: Diagnosis not present

## 2023-06-14 DIAGNOSIS — F32A Depression, unspecified: Secondary | ICD-10-CM | POA: Diagnosis not present

## 2023-06-14 DIAGNOSIS — F02B11 Dementia in other diseases classified elsewhere, moderate, with agitation: Secondary | ICD-10-CM | POA: Diagnosis not present

## 2023-06-14 DIAGNOSIS — E1165 Type 2 diabetes mellitus with hyperglycemia: Secondary | ICD-10-CM | POA: Diagnosis not present

## 2023-06-14 DIAGNOSIS — G301 Alzheimer's disease with late onset: Secondary | ICD-10-CM | POA: Diagnosis not present

## 2023-06-14 DIAGNOSIS — F02B3 Dementia in other diseases classified elsewhere, moderate, with mood disturbance: Secondary | ICD-10-CM | POA: Diagnosis not present

## 2023-06-14 DIAGNOSIS — E1169 Type 2 diabetes mellitus with other specified complication: Secondary | ICD-10-CM | POA: Diagnosis not present

## 2023-06-14 DIAGNOSIS — I152 Hypertension secondary to endocrine disorders: Secondary | ICD-10-CM | POA: Diagnosis not present

## 2023-06-19 DIAGNOSIS — G301 Alzheimer's disease with late onset: Secondary | ICD-10-CM | POA: Diagnosis not present

## 2023-06-19 DIAGNOSIS — E1169 Type 2 diabetes mellitus with other specified complication: Secondary | ICD-10-CM | POA: Diagnosis not present

## 2023-06-19 DIAGNOSIS — I152 Hypertension secondary to endocrine disorders: Secondary | ICD-10-CM | POA: Diagnosis not present

## 2023-06-19 DIAGNOSIS — F02B3 Dementia in other diseases classified elsewhere, moderate, with mood disturbance: Secondary | ICD-10-CM | POA: Diagnosis not present

## 2023-06-19 DIAGNOSIS — E782 Mixed hyperlipidemia: Secondary | ICD-10-CM | POA: Diagnosis not present

## 2023-06-19 DIAGNOSIS — E1159 Type 2 diabetes mellitus with other circulatory complications: Secondary | ICD-10-CM | POA: Diagnosis not present

## 2023-06-19 DIAGNOSIS — E1165 Type 2 diabetes mellitus with hyperglycemia: Secondary | ICD-10-CM | POA: Diagnosis not present

## 2023-06-19 DIAGNOSIS — F02B11 Dementia in other diseases classified elsewhere, moderate, with agitation: Secondary | ICD-10-CM | POA: Diagnosis not present

## 2023-06-19 DIAGNOSIS — F32A Depression, unspecified: Secondary | ICD-10-CM | POA: Diagnosis not present

## 2023-06-25 ENCOUNTER — Other Ambulatory Visit: Payer: Self-pay | Admitting: Internal Medicine

## 2023-06-25 DIAGNOSIS — I5020 Unspecified systolic (congestive) heart failure: Secondary | ICD-10-CM

## 2023-06-25 DIAGNOSIS — E1159 Type 2 diabetes mellitus with other circulatory complications: Secondary | ICD-10-CM | POA: Diagnosis not present

## 2023-06-25 DIAGNOSIS — I152 Hypertension secondary to endocrine disorders: Secondary | ICD-10-CM | POA: Diagnosis not present

## 2023-06-25 DIAGNOSIS — F02B11 Dementia in other diseases classified elsewhere, moderate, with agitation: Secondary | ICD-10-CM | POA: Diagnosis not present

## 2023-06-25 DIAGNOSIS — G301 Alzheimer's disease with late onset: Secondary | ICD-10-CM | POA: Diagnosis not present

## 2023-06-25 DIAGNOSIS — E1165 Type 2 diabetes mellitus with hyperglycemia: Secondary | ICD-10-CM | POA: Diagnosis not present

## 2023-06-25 DIAGNOSIS — F32A Depression, unspecified: Secondary | ICD-10-CM | POA: Diagnosis not present

## 2023-06-25 DIAGNOSIS — E782 Mixed hyperlipidemia: Secondary | ICD-10-CM | POA: Diagnosis not present

## 2023-06-25 DIAGNOSIS — F02B3 Dementia in other diseases classified elsewhere, moderate, with mood disturbance: Secondary | ICD-10-CM | POA: Diagnosis not present

## 2023-06-25 DIAGNOSIS — E1169 Type 2 diabetes mellitus with other specified complication: Secondary | ICD-10-CM | POA: Diagnosis not present

## 2023-06-30 ENCOUNTER — Telehealth: Payer: Self-pay | Admitting: Internal Medicine

## 2023-06-30 DIAGNOSIS — I152 Hypertension secondary to endocrine disorders: Secondary | ICD-10-CM | POA: Diagnosis not present

## 2023-06-30 DIAGNOSIS — E1159 Type 2 diabetes mellitus with other circulatory complications: Secondary | ICD-10-CM | POA: Diagnosis not present

## 2023-06-30 DIAGNOSIS — E782 Mixed hyperlipidemia: Secondary | ICD-10-CM | POA: Diagnosis not present

## 2023-06-30 DIAGNOSIS — E1165 Type 2 diabetes mellitus with hyperglycemia: Secondary | ICD-10-CM | POA: Diagnosis not present

## 2023-06-30 DIAGNOSIS — G301 Alzheimer's disease with late onset: Secondary | ICD-10-CM | POA: Diagnosis not present

## 2023-06-30 DIAGNOSIS — F02B11 Dementia in other diseases classified elsewhere, moderate, with agitation: Secondary | ICD-10-CM | POA: Diagnosis not present

## 2023-06-30 DIAGNOSIS — F02B3 Dementia in other diseases classified elsewhere, moderate, with mood disturbance: Secondary | ICD-10-CM | POA: Diagnosis not present

## 2023-06-30 DIAGNOSIS — E1169 Type 2 diabetes mellitus with other specified complication: Secondary | ICD-10-CM | POA: Diagnosis not present

## 2023-06-30 DIAGNOSIS — F32A Depression, unspecified: Secondary | ICD-10-CM | POA: Diagnosis not present

## 2023-06-30 NOTE — Telephone Encounter (Signed)
 Dawn, nurse with [facility], left VM wanting home health aide orders to help with bathing assistance and prevent caregiver burnout. Verbal orders can be given on phone number [phone]

## 2023-07-01 ENCOUNTER — Other Ambulatory Visit: Payer: Self-pay | Admitting: Internal Medicine

## 2023-07-01 DIAGNOSIS — I420 Dilated cardiomyopathy: Secondary | ICD-10-CM

## 2023-07-04 ENCOUNTER — Ambulatory Visit: Payer: Medicare HMO | Admitting: Internal Medicine

## 2023-07-10 DIAGNOSIS — E782 Mixed hyperlipidemia: Secondary | ICD-10-CM | POA: Diagnosis not present

## 2023-07-10 DIAGNOSIS — E1169 Type 2 diabetes mellitus with other specified complication: Secondary | ICD-10-CM | POA: Diagnosis not present

## 2023-07-10 DIAGNOSIS — F02B11 Dementia in other diseases classified elsewhere, moderate, with agitation: Secondary | ICD-10-CM | POA: Diagnosis not present

## 2023-07-10 DIAGNOSIS — G301 Alzheimer's disease with late onset: Secondary | ICD-10-CM | POA: Diagnosis not present

## 2023-07-10 DIAGNOSIS — F32A Depression, unspecified: Secondary | ICD-10-CM | POA: Diagnosis not present

## 2023-07-10 DIAGNOSIS — E1159 Type 2 diabetes mellitus with other circulatory complications: Secondary | ICD-10-CM | POA: Diagnosis not present

## 2023-07-10 DIAGNOSIS — I152 Hypertension secondary to endocrine disorders: Secondary | ICD-10-CM | POA: Diagnosis not present

## 2023-07-10 DIAGNOSIS — E1165 Type 2 diabetes mellitus with hyperglycemia: Secondary | ICD-10-CM | POA: Diagnosis not present

## 2023-07-10 DIAGNOSIS — F02B3 Dementia in other diseases classified elsewhere, moderate, with mood disturbance: Secondary | ICD-10-CM | POA: Diagnosis not present

## 2023-07-16 DIAGNOSIS — F02B3 Dementia in other diseases classified elsewhere, moderate, with mood disturbance: Secondary | ICD-10-CM | POA: Diagnosis not present

## 2023-07-16 DIAGNOSIS — E1169 Type 2 diabetes mellitus with other specified complication: Secondary | ICD-10-CM | POA: Diagnosis not present

## 2023-07-16 DIAGNOSIS — F32A Depression, unspecified: Secondary | ICD-10-CM | POA: Diagnosis not present

## 2023-07-16 DIAGNOSIS — I152 Hypertension secondary to endocrine disorders: Secondary | ICD-10-CM | POA: Diagnosis not present

## 2023-07-16 DIAGNOSIS — G301 Alzheimer's disease with late onset: Secondary | ICD-10-CM | POA: Diagnosis not present

## 2023-07-16 DIAGNOSIS — E782 Mixed hyperlipidemia: Secondary | ICD-10-CM | POA: Diagnosis not present

## 2023-07-16 DIAGNOSIS — E1159 Type 2 diabetes mellitus with other circulatory complications: Secondary | ICD-10-CM | POA: Diagnosis not present

## 2023-07-16 DIAGNOSIS — E1165 Type 2 diabetes mellitus with hyperglycemia: Secondary | ICD-10-CM | POA: Diagnosis not present

## 2023-07-16 DIAGNOSIS — F02B11 Dementia in other diseases classified elsewhere, moderate, with agitation: Secondary | ICD-10-CM | POA: Diagnosis not present

## 2023-07-17 DIAGNOSIS — F02B11 Dementia in other diseases classified elsewhere, moderate, with agitation: Secondary | ICD-10-CM | POA: Diagnosis not present

## 2023-07-17 DIAGNOSIS — F02B3 Dementia in other diseases classified elsewhere, moderate, with mood disturbance: Secondary | ICD-10-CM | POA: Diagnosis not present

## 2023-07-17 DIAGNOSIS — F32A Depression, unspecified: Secondary | ICD-10-CM | POA: Diagnosis not present

## 2023-07-17 DIAGNOSIS — G301 Alzheimer's disease with late onset: Secondary | ICD-10-CM | POA: Diagnosis not present

## 2023-07-17 DIAGNOSIS — E1159 Type 2 diabetes mellitus with other circulatory complications: Secondary | ICD-10-CM | POA: Diagnosis not present

## 2023-07-17 DIAGNOSIS — I152 Hypertension secondary to endocrine disorders: Secondary | ICD-10-CM | POA: Diagnosis not present

## 2023-07-17 DIAGNOSIS — E1165 Type 2 diabetes mellitus with hyperglycemia: Secondary | ICD-10-CM | POA: Diagnosis not present

## 2023-07-17 DIAGNOSIS — E782 Mixed hyperlipidemia: Secondary | ICD-10-CM | POA: Diagnosis not present

## 2023-07-17 DIAGNOSIS — E1169 Type 2 diabetes mellitus with other specified complication: Secondary | ICD-10-CM | POA: Diagnosis not present

## 2023-07-18 ENCOUNTER — Telehealth: Payer: Self-pay | Admitting: Internal Medicine

## 2023-07-18 NOTE — Telephone Encounter (Signed)
 Dawn, nurse with [facility], left VM that patient is being d/c from home health on 4/8 due to no changes/goals have been met. They are requesting a verbal order for social worker eval for community resources to get long term care.   Callback # [phone]

## 2023-07-20 ENCOUNTER — Other Ambulatory Visit: Payer: Self-pay | Admitting: Internal Medicine

## 2023-07-20 DIAGNOSIS — R110 Nausea: Secondary | ICD-10-CM

## 2023-07-21 DIAGNOSIS — F03B18 Unspecified dementia, moderate, with other behavioral disturbance: Secondary | ICD-10-CM | POA: Diagnosis not present

## 2023-07-21 DIAGNOSIS — R443 Hallucinations, unspecified: Secondary | ICD-10-CM | POA: Diagnosis not present

## 2023-07-21 DIAGNOSIS — G479 Sleep disorder, unspecified: Secondary | ICD-10-CM | POA: Diagnosis not present

## 2023-07-27 DIAGNOSIS — F0390 Unspecified dementia without behavioral disturbance: Secondary | ICD-10-CM | POA: Diagnosis not present

## 2023-07-27 DIAGNOSIS — E860 Dehydration: Secondary | ICD-10-CM | POA: Diagnosis not present

## 2023-07-30 DIAGNOSIS — E1169 Type 2 diabetes mellitus with other specified complication: Secondary | ICD-10-CM | POA: Diagnosis not present

## 2023-07-30 DIAGNOSIS — F02B11 Dementia in other diseases classified elsewhere, moderate, with agitation: Secondary | ICD-10-CM | POA: Diagnosis not present

## 2023-07-30 DIAGNOSIS — E1165 Type 2 diabetes mellitus with hyperglycemia: Secondary | ICD-10-CM | POA: Diagnosis not present

## 2023-07-30 DIAGNOSIS — E1159 Type 2 diabetes mellitus with other circulatory complications: Secondary | ICD-10-CM | POA: Diagnosis not present

## 2023-07-30 DIAGNOSIS — F02B3 Dementia in other diseases classified elsewhere, moderate, with mood disturbance: Secondary | ICD-10-CM | POA: Diagnosis not present

## 2023-07-30 DIAGNOSIS — E782 Mixed hyperlipidemia: Secondary | ICD-10-CM | POA: Diagnosis not present

## 2023-07-30 DIAGNOSIS — I152 Hypertension secondary to endocrine disorders: Secondary | ICD-10-CM | POA: Diagnosis not present

## 2023-07-30 DIAGNOSIS — F32A Depression, unspecified: Secondary | ICD-10-CM | POA: Diagnosis not present

## 2023-07-30 DIAGNOSIS — G301 Alzheimer's disease with late onset: Secondary | ICD-10-CM | POA: Diagnosis not present

## 2023-07-31 DIAGNOSIS — E782 Mixed hyperlipidemia: Secondary | ICD-10-CM | POA: Diagnosis not present

## 2023-07-31 DIAGNOSIS — F02B11 Dementia in other diseases classified elsewhere, moderate, with agitation: Secondary | ICD-10-CM | POA: Diagnosis not present

## 2023-07-31 DIAGNOSIS — E1165 Type 2 diabetes mellitus with hyperglycemia: Secondary | ICD-10-CM | POA: Diagnosis not present

## 2023-07-31 DIAGNOSIS — F02B3 Dementia in other diseases classified elsewhere, moderate, with mood disturbance: Secondary | ICD-10-CM | POA: Diagnosis not present

## 2023-07-31 DIAGNOSIS — I152 Hypertension secondary to endocrine disorders: Secondary | ICD-10-CM | POA: Diagnosis not present

## 2023-07-31 DIAGNOSIS — E1169 Type 2 diabetes mellitus with other specified complication: Secondary | ICD-10-CM | POA: Diagnosis not present

## 2023-07-31 DIAGNOSIS — F32A Depression, unspecified: Secondary | ICD-10-CM | POA: Diagnosis not present

## 2023-07-31 DIAGNOSIS — G301 Alzheimer's disease with late onset: Secondary | ICD-10-CM | POA: Diagnosis not present

## 2023-07-31 DIAGNOSIS — E1159 Type 2 diabetes mellitus with other circulatory complications: Secondary | ICD-10-CM | POA: Diagnosis not present

## 2023-08-01 ENCOUNTER — Encounter: Admitting: Family

## 2023-08-06 ENCOUNTER — Emergency Department (HOSPITAL_COMMUNITY)
Admission: EM | Admit: 2023-08-06 | Discharge: 2023-08-06 | Disposition: A | Discharge status: Home / Self Care | Attending: Emergency Medicine | Admitting: Emergency Medicine

## 2023-08-06 ENCOUNTER — Other Ambulatory Visit: Payer: Self-pay

## 2023-08-06 ENCOUNTER — Emergency Department (HOSPITAL_COMMUNITY)

## 2023-08-06 DIAGNOSIS — Y92410 Unspecified street and highway as the place of occurrence of the external cause: Secondary | ICD-10-CM | POA: Insufficient documentation

## 2023-08-06 DIAGNOSIS — F0390 Unspecified dementia without behavioral disturbance: Secondary | ICD-10-CM | POA: Insufficient documentation

## 2023-08-06 DIAGNOSIS — Z794 Long term (current) use of insulin: Secondary | ICD-10-CM | POA: Diagnosis not present

## 2023-08-06 DIAGNOSIS — S0990XA Unspecified injury of head, initial encounter: Secondary | ICD-10-CM | POA: Diagnosis not present

## 2023-08-06 DIAGNOSIS — G319 Degenerative disease of nervous system, unspecified: Secondary | ICD-10-CM | POA: Diagnosis not present

## 2023-08-06 DIAGNOSIS — I6782 Cerebral ischemia: Secondary | ICD-10-CM | POA: Diagnosis not present

## 2023-08-06 DIAGNOSIS — S199XXA Unspecified injury of neck, initial encounter: Secondary | ICD-10-CM | POA: Diagnosis not present

## 2023-08-06 LAB — CBG MONITORING, ED: Glucose-Capillary: 130 mg/dL — ABNORMAL HIGH (ref 70–99)
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
## 2023-08-14 NOTE — Progress Notes (Unsigned)
 This encounter was created in error - please disregard.

## 2023-08-21 ENCOUNTER — Other Ambulatory Visit: Payer: Self-pay | Admitting: Internal Medicine

## 2023-08-21 DIAGNOSIS — E559 Vitamin D deficiency, unspecified: Secondary | ICD-10-CM

## 2023-09-16 ENCOUNTER — Other Ambulatory Visit: Payer: Self-pay | Admitting: Internal Medicine

## 2023-09-16 DIAGNOSIS — R110 Nausea: Secondary | ICD-10-CM

## 2023-09-22 ENCOUNTER — Other Ambulatory Visit: Payer: Self-pay | Admitting: Cardiovascular Disease

## 2023-09-24 ENCOUNTER — Other Ambulatory Visit: Payer: Self-pay | Admitting: Family

## 2023-09-24 DIAGNOSIS — E1165 Type 2 diabetes mellitus with hyperglycemia: Secondary | ICD-10-CM

## 2023-09-25 ENCOUNTER — Ambulatory Visit
Admission: RE | Admit: 2023-09-25 | Discharge: 2023-09-25 | Disposition: A | Discharge status: Home / Self Care | Attending: Internal Medicine | Admitting: Internal Medicine

## 2023-09-25 ENCOUNTER — Ambulatory Visit (INDEPENDENT_AMBULATORY_CARE_PROVIDER_SITE_OTHER): Payer: Medicare HMO | Admitting: Internal Medicine

## 2023-09-25 ENCOUNTER — Ambulatory Visit: Payer: Self-pay | Admitting: Internal Medicine

## 2023-09-25 ENCOUNTER — Ambulatory Visit
Admission: RE | Admit: 2023-09-25 | Discharge: 2023-09-25 | Disposition: A | Discharge status: Home / Self Care | Source: Ambulatory Visit | Attending: Internal Medicine | Admitting: Internal Medicine

## 2023-09-25 ENCOUNTER — Encounter: Payer: Self-pay | Admitting: Internal Medicine

## 2023-09-25 VITALS — BP 126/68 | HR 68 | Ht 61.0 in | Wt 164.4 lb

## 2023-09-25 DIAGNOSIS — D508 Other iron deficiency anemias: Secondary | ICD-10-CM

## 2023-09-25 DIAGNOSIS — M25562 Pain in left knee: Secondary | ICD-10-CM

## 2023-09-25 DIAGNOSIS — E1165 Type 2 diabetes mellitus with hyperglycemia: Secondary | ICD-10-CM

## 2023-09-25 DIAGNOSIS — E1169 Type 2 diabetes mellitus with other specified complication: Secondary | ICD-10-CM | POA: Diagnosis not present

## 2023-09-25 DIAGNOSIS — E1159 Type 2 diabetes mellitus with other circulatory complications: Secondary | ICD-10-CM

## 2023-09-25 DIAGNOSIS — Z794 Long term (current) use of insulin: Secondary | ICD-10-CM

## 2023-09-25 DIAGNOSIS — E782 Mixed hyperlipidemia: Secondary | ICD-10-CM

## 2023-09-25 DIAGNOSIS — I152 Hypertension secondary to endocrine disorders: Secondary | ICD-10-CM

## 2023-09-25 DIAGNOSIS — M1712 Unilateral primary osteoarthritis, left knee: Secondary | ICD-10-CM | POA: Diagnosis not present

## 2023-09-25 LAB — POCT CBG (FASTING - GLUCOSE)-MANUAL ENTRY: Glucose Fasting, POC: 254 mg/dL — AB (ref 70–99)

## 2023-09-25 NOTE — Progress Notes (Signed)
 Established Patient Office Visit  Subjective:  Patient ID: [name], female    DOB: 02-10-1936  Age: 88 y.o. MRN: [id number]  Chief Complaint  Patient presents with   Follow-up    4 month follow up    Patient comes in for follow-up accompanied by her friend.  She complains of not feeling well because her left knee is painful and is hard to put weight on it.  She has some morning stiffness as well.  According to the friend her memory is declining steadily.  She needs labs today.  Also will check an x-ray of her left knee.  Advised to use Tylenol  and Voltaren gel as needed.  Denies chest pain, no shortness of breath, no palpitations.    No other concerns at this time.   Past Medical History:  Diagnosis Date   Asthma    childhood   Depression    Diabetes mellitus    Type II   Foot fracture, right 3/13   H/O scoliosis    HLD (hyperlipidemia)    HTN (hypertension)    Osteoarthritis    hands,elbow,knees   Osteoporosis    Rhinitis, allergic    Scoliosis    chronic back pain    Past Surgical History:  Procedure Laterality Date   bladder tack     CARDIAC CATHETERIZATION Right 03/19/2016   Procedure: Right/Left Heart Cath and Coronary Angiography;  Surgeon: Cherrie Cornwall, MD;  Location: [facility] INVASIVE CV LAB;  Service: Cardiovascular;  Laterality: Right;   TUBAL LIGATION     bilateral    Social History   Socioeconomic History   Marital status: Widowed    Spouse name: Not on file   Number of children: Not on file   Years of education: Not on file   Highest education level: Not on file  Occupational History   Not on file  Tobacco Use   Smoking status: Never   Smokeless tobacco: Never  Substance and Sexual Activity   Alcohol use: No   Drug use: No   Sexual activity: Never  Other Topics Concern   Not on file  Social History Narrative   Not on file   Social Drivers of Health   Financial Resource Strain: Low Risk  (07/21/2023)   Received from [facility]   Overall Financial Resource Strain (CARDIA)    Difficulty of Paying Living Expenses: Not hard at all  Food Insecurity: No Food Insecurity (07/21/2023)   Received from [facility]   Hunger Vital Sign    Worried About Running Out of Food in the Last Year: Never true    Ran Out of Food in the Last Year: Never true  Transportation Needs: No Transportation Needs (07/21/2023)   Received from [facility] - Transportation    In the past 12 months, has lack of transportation kept you from medical appointments or from getting medications?: No    Lack of Transportation (Non-Medical): No  Physical Activity: Not on file  Stress: Not on file  Social Connections: Not on file  Intimate Partner Violence: Not At Risk (06/05/2022)   Humiliation, Afraid, Rape, and Kick questionnaire    Fear of Current or Ex-Partner: No    Emotionally Abused: No    Physically Abused: No    Sexually Abused: No    Family History  Problem Relation Age of Onset   Coronary artery disease Mother    Cancer Mother  thyroid    Heart attack Mother    Coronary artery disease Father    Heart attack Father    Multiple sclerosis Sister    Breast cancer Paternal Grandmother     Allergies  Allergen Reactions   Acetaminophen      REACTION: nausea   Codeine     REACTION: itching   Fluticasone  Propionate     REACTION: palpitations   Fosamax  [Alendronate  Sodium]     Difficulty swallowing and fever/ joint pain    Lidocaine Nausea And Vomiting    Increases heart rate, N/V per pt   Naproxen     REACTION: GI   Nsaids     REACTION: elevated LFT's   Penicillins     REACTION: swelling at inj site    Outpatient Medications Prior to Visit  Medication Sig   ACCU-CHEK GUIDE test strip USE TO CHECK BLOOD GLUCOSE DAILY   baclofen (LIORESAL) 10 MG tablet TAKE 1/2 A TABLET BY MOUTH AT BEDTIME AS NEEDED FOR MUSCLE SPASM   BD PEN NEEDLE NANO 2ND GEN 32G X 4  MM MISC USE TWICE A DAY WITH INSULIN    Blood Glucose Monitoring Suppl (ONETOUCH VERIO FLEX SYSTEM) W/DEVICE KIT 1 Device by Other route daily. Check blood sugar once daily and as directed. Dx E11.9   Calcium  Carbonate-Vitamin D  (CALCIUM  600+D) 600-400 MG-UNIT per tablet Take 2 tablets by mouth daily.     carvedilol  (COREG ) 12.5 MG tablet TAKE 1 TABLET BY MOUTH TWICE A DAY   Continuous Glucose Sensor (FREESTYLE LIBRE 14 DAY SENSOR) MISC 1 each by Does not apply route every 14 (fourteen) days.   donepezil (ARICEPT) 10 MG tablet TAKE 1 TABLET BY MOUTH EVERY DAY   ENTRESTO  24-26 MG TAKE 1/2 (HALF) TABLET TWICE DAILY.   ezetimibe (ZETIA) 10 MG tablet TAKE 1 TABLET BY MOUTH EVERY DAY   furosemide  (LASIX ) 20 MG tablet TAKE 1 TABLET BY MOUTH EVERY DAY **MAKE FOLLOW UP APPT WITH DR. Saahil Herbster FOR REFILLS**   gabapentin  (NEURONTIN ) 100 MG capsule TAKE 1 CAPSULE BY MOUTH EVERY DAY   insulin  aspart protamine - aspart (NOVOLOG  MIX 70/30 FLEXPEN) (70-30) 100 UNIT/ML FlexPen INJECT 40 UNITS TWICE A DAY   memantine  (NAMENDA ) 5 MG tablet TAKE 1 TABLET BY MOUTH TWICE A DAY   Multiple Vitamin (MULTIVITAMIN) tablet Take 1 tablet by mouth daily.     nitroGLYCERIN  (NITROSTAT ) 0.4 MG SL tablet TAKE 1 TABLET UNDER TONGUE FOR CHEST PAIN. MAY REPEAT 2 TIMES MORE AT 5 MINUTE INTERVALS   ondansetron  (ZOFRAN ) 4 MG tablet TAKE 1 TABLET BY MOUTH EVERY 8 HOURS AS NEEDED FOR NAUSEA AND VOMITING   rosuvastatin  (CRESTOR ) 40 MG tablet TAKE 1 TABLET BY MOUTH EVERY DAY   Vitamin D , Ergocalciferol , (DRISDOL) 1.25 MG (50000 UNIT) CAPS capsule TAKE 1 CAPSULE BY MOUTH ONE TIME PER WEEK   No facility-administered medications prior to visit.    Review of Systems  Constitutional: Negative.  Negative for chills, fever, malaise/fatigue and weight loss.  HENT: Negative.    Eyes: Negative.   Respiratory: Negative.  Negative for cough and shortness of breath.   Cardiovascular: Negative.  Negative for chest pain, palpitations and leg swelling.   Gastrointestinal: Negative.  Negative for abdominal pain, constipation, diarrhea, heartburn, nausea and vomiting.  Genitourinary: Negative.  Negative for dysuria and flank pain.  Musculoskeletal:  Positive for joint pain. Negative for myalgias.  Skin: Negative.   Neurological: Negative.  Negative for dizziness, sensory change, speech change, focal weakness and headaches.  Endo/Heme/Allergies: Negative.  Psychiatric/Behavioral:  Positive for memory loss. Negative for depression and suicidal ideas. The patient is not nervous/anxious.        Objective:   BP 126/68   Pulse 68   Ht 5\' 1"  (1.549 m)   Wt 164 lb 6.4 oz (74.6 kg)   LMP 04/22/1985   SpO2 97%   BMI 31.06 kg/m   Vitals:   09/25/23 1258  BP: 126/68  Pulse: 68  Height: 5\' 1"  (1.549 m)  Weight: 164 lb 6.4 oz (74.6 kg)  SpO2: 97%  BMI (Calculated): 31.08    Physical Exam Vitals and nursing note reviewed.  Constitutional:      Appearance: Normal appearance.  HENT:     Head: Normocephalic and atraumatic.     Nose: Nose normal.     Mouth/Throat:     Mouth: Mucous membranes are moist.     Pharynx: Oropharynx is clear.  Eyes:     Conjunctiva/sclera: Conjunctivae normal.     Pupils: Pupils are equal, round, and reactive to light.  Cardiovascular:     Rate and Rhythm: Normal rate and regular rhythm.     Pulses: Normal pulses.     Heart sounds: Normal heart sounds. No murmur heard. Pulmonary:     Effort: Pulmonary effort is normal.     Breath sounds: Normal breath sounds. No wheezing.  Abdominal:     General: Bowel sounds are normal.     Palpations: Abdomen is soft.     Tenderness: There is no abdominal tenderness. There is no right CVA tenderness or left CVA tenderness.  Musculoskeletal:        General: Normal range of motion.     Cervical back: Normal range of motion.     Right lower leg: No edema.     Left lower leg: No edema.  Skin:    General: Skin is warm and dry.  Neurological:     General: No focal  deficit present.     Mental Status: She is alert and oriented to person, place, and time.  Psychiatric:        Mood and Affect: Mood normal.        Behavior: Behavior normal.      Results for orders placed or performed in visit on 09/25/23  POCT CBG (Fasting - Glucose)  Result Value Ref Range   Glucose Fasting, POC 254 (A) 70 - 99 mg/dL    Recent Results (from the past 2160 hours)  POC CBG, ED     Status: Abnormal   Collection Time: 08/06/23 10:24 PM  Result Value Ref Range   Glucose-Capillary 130 (H) 70 - 99 mg/dL    Comment: Glucose reference range applies only to samples taken after fasting for at least 8 hours.  POCT CBG (Fasting - Glucose)     Status: Abnormal   Collection Time: 09/25/23  1:04 PM  Result Value Ref Range   Glucose Fasting, POC 254 (A) 70 - 99 mg/dL      Assessment & Plan:  X-ray of the left knee.  Blood work today.  Continue current medications. Problem List Items Addressed This Visit     Anemia   Relevant Orders   CBC with Diff   Hypertension associated with diabetes (HCC) - Primary   Relevant Orders   CMP14+EGFR   Type 2 diabetes mellitus with hyperglycemia (HCC)   Relevant Orders   POCT CBG (Fasting - Glucose) (Completed)   Hemoglobin A1c   Combined hyperlipidemia associated with type 2 diabetes  mellitus (HCC)   Relevant Orders   Lipid Panel w/o Chol/HDL Ratio   Other Visit Diagnoses       Acute pain of left knee       Relevant Orders   DG Knee Complete 4 Views Left       Return in about 3 months (around 12/26/2023).   Total time spent: 30 minutes  Aisha Hove, MD  09/25/2023   This document may have been prepared by [facility] Voice Recognition software and as such may include unintentional dictation errors.

## 2023-09-26 LAB — CMP14+EGFR
ALT: 35 IU/L — ABNORMAL HIGH (ref 0–32)
AST: 35 IU/L (ref 0–40)
Albumin: 4.3 g/dL (ref 3.7–4.7)
Alkaline Phosphatase: 61 IU/L (ref 44–121)
BUN/Creatinine Ratio: 13 (ref 12–28)
BUN: 15 mg/dL (ref 8–27)
Bilirubin Total: 0.6 mg/dL (ref 0.0–1.2)
CO2: 24 mmol/L (ref 20–29)
Calcium: 10 mg/dL (ref 8.7–10.3)
Chloride: 102 mmol/L (ref 96–106)
Creatinine, Ser: 1.15 mg/dL — ABNORMAL HIGH (ref 0.57–1.00)
Globulin, Total: 2.6 g/dL (ref 1.5–4.5)
Glucose: 188 mg/dL — ABNORMAL HIGH (ref 70–99)
Potassium: 3.7 mmol/L (ref 3.5–5.2)
Sodium: 142 mmol/L (ref 134–144)
Total Protein: 6.9 g/dL (ref 6.0–8.5)
eGFR: 47 mL/min/{1.73_m2} — ABNORMAL LOW (ref >59)

## 2023-09-26 LAB — CBC WITH DIFFERENTIAL/PLATELET
Basophils Absolute: 0.1 10*3/uL (ref 0.0–0.2)
Basos: 1 %
EOS (ABSOLUTE): 0.1 10*3/uL (ref 0.0–0.4)
Eos: 2 %
Hematocrit: 37.5 % (ref 34.0–46.6)
Hemoglobin: 12.3 g/dL (ref 11.1–15.9)
Immature Grans (Abs): 0 10*3/uL (ref 0.0–0.1)
Immature Granulocytes: 0 %
Lymphocytes Absolute: 2.1 10*3/uL (ref 0.7–3.1)
Lymphs: 25 %
MCH: 31.5 pg (ref 26.6–33.0)
MCHC: 32.8 g/dL (ref 31.5–35.7)
MCV: 96 fL (ref 79–97)
Monocytes Absolute: 0.5 10*3/uL (ref 0.1–0.9)
Monocytes: 6 %
Neutrophils Absolute: 5.5 10*3/uL (ref 1.4–7.0)
Neutrophils: 66 %
Platelets: 211 10*3/uL (ref 150–450)
RBC: 3.9 x10E6/uL (ref 3.77–5.28)
RDW: 13.8 % (ref 11.7–15.4)
WBC: 8.4 10*3/uL (ref 3.4–10.8)

## 2023-09-26 LAB — LIPID PANEL W/O CHOL/HDL RATIO
Cholesterol, Total: 92 mg/dL — ABNORMAL LOW (ref 100–199)
HDL: 48 mg/dL (ref >39)
LDL Chol Calc (NIH): 27 mg/dL (ref 0–99)
Triglycerides: 83 mg/dL (ref 0–149)
VLDL Cholesterol Cal: 17 mg/dL (ref 5–40)

## 2023-09-26 LAB — HEMOGLOBIN A1C
Est. average glucose Bld gHb Est-mCnc: 171 mg/dL
Hgb A1c MFr Bld: 7.6 % — ABNORMAL HIGH (ref 4.8–5.6)

## 2023-10-02 NOTE — Progress Notes (Signed)
 Patient notified

## 2023-10-13 ENCOUNTER — Other Ambulatory Visit: Payer: Self-pay | Admitting: Internal Medicine

## 2023-10-13 DIAGNOSIS — F02B0 Dementia in other diseases classified elsewhere, moderate, without behavioral disturbance, psychotic disturbance, mood disturbance, and anxiety: Secondary | ICD-10-CM

## 2023-10-29 ENCOUNTER — Other Ambulatory Visit: Payer: Self-pay | Admitting: Internal Medicine

## 2023-10-29 DIAGNOSIS — E1165 Type 2 diabetes mellitus with hyperglycemia: Secondary | ICD-10-CM

## 2023-12-03 DIAGNOSIS — Z794 Long term (current) use of insulin: Secondary | ICD-10-CM | POA: Diagnosis not present

## 2023-12-03 DIAGNOSIS — L600 Ingrowing nail: Secondary | ICD-10-CM | POA: Diagnosis not present

## 2023-12-03 DIAGNOSIS — E1140 Type 2 diabetes mellitus with diabetic neuropathy, unspecified: Secondary | ICD-10-CM | POA: Diagnosis not present

## 2023-12-03 DIAGNOSIS — B351 Tinea unguium: Secondary | ICD-10-CM | POA: Diagnosis not present

## 2023-12-03 DIAGNOSIS — L03032 Cellulitis of left toe: Secondary | ICD-10-CM | POA: Diagnosis not present

## 2023-12-26 ENCOUNTER — Ambulatory Visit: Payer: Self-pay | Admitting: Internal Medicine

## 2023-12-26 ENCOUNTER — Ambulatory Visit (INDEPENDENT_AMBULATORY_CARE_PROVIDER_SITE_OTHER): Admitting: Internal Medicine

## 2023-12-26 ENCOUNTER — Encounter: Payer: Self-pay | Admitting: Internal Medicine

## 2023-12-26 VITALS — BP 110/60 | HR 75 | Ht 61.0 in | Wt 165.0 lb

## 2023-12-26 DIAGNOSIS — E782 Mixed hyperlipidemia: Secondary | ICD-10-CM

## 2023-12-26 DIAGNOSIS — E1165 Type 2 diabetes mellitus with hyperglycemia: Secondary | ICD-10-CM

## 2023-12-26 DIAGNOSIS — Z794 Long term (current) use of insulin: Secondary | ICD-10-CM | POA: Diagnosis not present

## 2023-12-26 DIAGNOSIS — E1159 Type 2 diabetes mellitus with other circulatory complications: Secondary | ICD-10-CM

## 2023-12-26 DIAGNOSIS — I152 Hypertension secondary to endocrine disorders: Secondary | ICD-10-CM

## 2023-12-26 DIAGNOSIS — G301 Alzheimer's disease with late onset: Secondary | ICD-10-CM | POA: Diagnosis not present

## 2023-12-26 DIAGNOSIS — F02B0 Dementia in other diseases classified elsewhere, moderate, without behavioral disturbance, psychotic disturbance, mood disturbance, and anxiety: Secondary | ICD-10-CM | POA: Diagnosis not present

## 2023-12-26 DIAGNOSIS — E1169 Type 2 diabetes mellitus with other specified complication: Secondary | ICD-10-CM | POA: Diagnosis not present

## 2023-12-26 LAB — POCT CBG (FASTING - GLUCOSE)-MANUAL ENTRY: Glucose Fasting, POC: 204 mg/dL — AB (ref 70–99)

## 2023-12-26 NOTE — Progress Notes (Signed)
 Established Patient Office Visit  Subjective:  Patient ID: [name], female    DOB: [birth year]  Age: 88 y.o. MRN: [id number]  Chief Complaint  Patient presents with   Follow-up    3 month follow up    Patient is here today for a follow up accompanied by her caregiver. He reports she is taking her medications without issues and reports she has been doing well overall. She endorses occasional osteoarthritis discomfort in her knees but today denies any pain. Her caregiver reports her blood sugars at home range 130-140s. He states she gets 35 units of insulin  in the morning and 30 units of insulin  before bed. Encouraged him to increase her night time insulin  to 35 units and can give an additional 2 units for a meal time that was high in carbohydrates or concentrated sweets. Patient appears to be having a good cognitive day. She denies any headache, chest pain, shortness of breath, abdominal pain, diarrhea, constipation or urinary incontinence. Her caregiver also denied her having any of these complaints and reports she is still able to tell him when something is bothering her at home and still is able to ambulate short distances and use the bathroom most days without incontinence. Will collect routine labs today; patient is not fasting but transportation can be difficult.    No other concerns at this time.   Past Medical History:  Diagnosis Date   Asthma    childhood   Depression    Diabetes mellitus    Type II   Foot fracture, right 3/13   H/O scoliosis    HLD (hyperlipidemia)    HTN (hypertension)    Osteoarthritis    hands,elbow,knees   Osteoporosis    Rhinitis, allergic    Scoliosis    chronic back pain    Past Surgical History:  Procedure Laterality Date   bladder tack     CARDIAC CATHETERIZATION Right 03/19/2016   Procedure: Right/Left Heart Cath and Coronary Angiography;  Surgeon: Denyse DELENA Bathe, MD;  Location: [facility];  Service: Cardiovascular;   Laterality: Right;   TUBAL LIGATION     bilateral    Social History   Socioeconomic History   Marital status: Widowed    Spouse name: Not on file   Number of children: Not on file   Years of education: Not on file   Highest education level: Not on file  Occupational History   Not on file  Tobacco Use   Smoking status: Never   Smokeless tobacco: Never  Substance and Sexual Activity   Alcohol use: No   Drug use: No   Sexual activity: Never  Other Topics Concern   Not on file  Social History Narrative   Not on file   Social Drivers of Health   Financial Resource Strain: Low Risk  (07/21/2023)   Received from [facility]   Overall Financial Resource Strain (CARDIA)    Difficulty of Paying Living Expenses: Not hard at all  Food Insecurity: No Food Insecurity (07/21/2023)   Received from [facility]   Hunger Vital Sign    Within the past 12 months, you worried that your food would run out before you got the money to buy more.: Never true    Within the past 12 months, the food you bought just didn't last and you didn't have money to get more.: Never true  Transportation Needs: No Transportation Needs (07/21/2023)   Received from [facility]   PRAPARE - Transportation    In the past 12 months, has lack of transportation kept you from medical appointments or from getting medications?: No    Lack of Transportation (Non-Medical): No  Physical Activity: Not on file  Stress: Not on file  Social Connections: Not on file  Intimate Partner Violence: Not At Risk (06/05/2022)   Humiliation, Afraid, Rape, and Kick questionnaire    Fear of Current or Ex-Partner: No    Emotionally Abused: No    Physically Abused: No    Sexually Abused: No    Family History  Problem Relation Age of Onset   Coronary artery disease Mother    Cancer Mother        thyroid    Heart attack Mother    Coronary artery disease Father    Heart attack Father     Multiple sclerosis Sister    Breast cancer Paternal Grandmother     Allergies  Allergen Reactions   Acetaminophen      REACTION: nausea   Codeine     REACTION: itching   Fluticasone  Propionate     REACTION: palpitations   Fosamax  [Alendronate  Sodium]     Difficulty swallowing and fever/ joint pain    Lidocaine Nausea And Vomiting    Increases heart rate, N/V per pt   Naproxen     REACTION: GI   Nsaids     REACTION: elevated LFT's   Penicillins     REACTION: swelling at inj site    Outpatient Medications Prior to Visit  Medication Sig   ACCU-CHEK GUIDE test strip USE TO CHECK BLOOD GLUCOSE DAILY   baclofen (LIORESAL) 10 MG tablet TAKE 1/2 A TABLET BY MOUTH AT BEDTIME AS NEEDED FOR MUSCLE SPASM   BD PEN NEEDLE NANO 2ND GEN 32G X 4 MM MISC USE TWICE A DAY WITH INSULIN    Blood Glucose Monitoring Suppl (ONETOUCH VERIO FLEX SYSTEM) W/DEVICE KIT 1 Device by Other route daily. Check blood sugar once daily and as directed. Dx E11.9   Calcium  Carbonate-Vitamin D  (CALCIUM  600+D) 600-400 MG-UNIT per tablet Take 2 tablets by mouth daily.     carvedilol  (COREG ) 12.5 MG tablet TAKE 1 TABLET BY MOUTH TWICE A DAY   Continuous Glucose Sensor (FREESTYLE LIBRE 14 DAY SENSOR) MISC 1 each by Does not apply route every 14 (fourteen) days.   donepezil (ARICEPT) 10 MG tablet TAKE 1 TABLET BY MOUTH EVERY DAY   ENTRESTO  24-26 MG TAKE 1/2 (HALF) TABLET TWICE DAILY.   ezetimibe (ZETIA) 10 MG tablet TAKE 1 TABLET BY MOUTH EVERY DAY   furosemide  (LASIX ) 20 MG tablet TAKE 1 TABLET BY MOUTH EVERY DAY **MAKE FOLLOW UP APPT WITH DR. Jarl Sellitto FOR REFILLS**   gabapentin  (NEURONTIN ) 100 MG capsule TAKE 1 CAPSULE BY MOUTH EVERY DAY   insulin  aspart protamine - aspart (NOVOLOG  MIX 70/30 FLEXPEN) (70-30) 100 UNIT/ML FlexPen INJECT 40 UNITS TWICE A DAY   Multiple Vitamin (MULTIVITAMIN) tablet Take 1 tablet by mouth daily.     nitroGLYCERIN  (NITROSTAT ) 0.4 MG SL tablet TAKE 1 TABLET UNDER TONGUE FOR CHEST PAIN. MAY  REPEAT 2 TIMES MORE AT 5 MINUTE INTERVALS   ondansetron  (ZOFRAN ) 4 MG tablet TAKE 1 TABLET BY MOUTH EVERY 8 HOURS AS NEEDED FOR NAUSEA AND VOMITING   rosuvastatin  (CRESTOR ) 40 MG tablet TAKE 1 TABLET BY MOUTH EVERY DAY   Vitamin D , Ergocalciferol , (DRISDOL) 1.25 MG (50000 UNIT) CAPS capsule TAKE 1 CAPSULE BY MOUTH ONE TIME PER WEEK   [DISCONTINUED] memantine  (NAMENDA )  5 MG tablet TAKE 1 TABLET BY MOUTH TWICE A DAY (Patient not taking: Reported on 12/26/2023)   No facility-administered medications prior to visit.    Review of Systems  Constitutional:  Negative for chills, fever, malaise/fatigue and weight loss.  HENT: Negative.    Eyes: Negative.   Respiratory: Negative.  Negative for cough and shortness of breath.   Cardiovascular: Negative.  Negative for chest pain, palpitations and leg swelling.  Gastrointestinal: Negative.  Negative for abdominal pain, constipation, diarrhea, heartburn, nausea and vomiting.  Genitourinary: Negative.  Negative for dysuria and flank pain.  Musculoskeletal: Negative.  Negative for joint pain and myalgias.  Skin: Negative.   Neurological: Negative.  Negative for dizziness and headaches.  Endo/Heme/Allergies: Negative.   Psychiatric/Behavioral: Negative.  Negative for depression and suicidal ideas. The patient is not nervous/anxious.        Objective:   BP 110/60   Pulse 75   Ht 5' 1 (1.549 m)   Wt 165 lb (74.8 kg)   LMP 04/22/1985   SpO2 94%   BMI 31.18 kg/m   Vitals:   12/26/23 1249  BP: 110/60  Pulse: 75  Height: 5' 1 (1.549 m)  Weight: 165 lb (74.8 kg)  SpO2: 94%  BMI (Calculated): 31.19    Physical Exam Vitals and nursing note reviewed.  Constitutional:      Appearance: Normal appearance.  HENT:     Head: Normocephalic and atraumatic.     Nose: Nose normal.     Mouth/Throat:     Mouth: Mucous membranes are moist.     Pharynx: Oropharynx is clear.  Eyes:     Conjunctiva/sclera: Conjunctivae normal.     Pupils: Pupils are  equal, round, and reactive to light.  Cardiovascular:     Rate and Rhythm: Normal rate and regular rhythm.     Pulses: Normal pulses.     Heart sounds: Normal heart sounds. No murmur heard. Pulmonary:     Effort: Pulmonary effort is normal.     Breath sounds: Normal breath sounds. No wheezing.  Abdominal:     General: Bowel sounds are normal.     Palpations: Abdomen is soft.     Tenderness: There is no abdominal tenderness. There is no right CVA tenderness or left CVA tenderness.  Musculoskeletal:        General: Normal range of motion.     Cervical back: Normal range of motion.     Right lower leg: No edema.     Left lower leg: No edema.  Skin:    General: Skin is warm and dry.  Neurological:     General: No focal deficit present.     Mental Status: She is alert and oriented to person, place, and time.  Psychiatric:        Mood and Affect: Mood normal.        Behavior: Behavior normal.      Results for orders placed or performed in visit on 12/26/23  POCT CBG (Fasting - Glucose)  Result Value Ref Range   Glucose Fasting, POC 204 (A) 70 - 99 mg/dL    Recent Results (from the past 2160 hours)  POCT CBG (Fasting - Glucose)     Status: Abnormal   Collection Time: 12/26/23  1:05 PM  Result Value Ref Range   Glucose Fasting, POC 204 (A) 70 - 99 mg/dL      Assessment & Plan:  Increase insulin  to 35 units twice daily, with additional 2 unit coverage on  high carbohydrate/sugar intake. Continue other medications as prescribed. Collect nonfasting labs today. Problem List Items Addressed This Visit     Hypertension associated with diabetes (HCC) - Primary   Relevant Orders   CBC with Diff   CMP14+EGFR   Type 2 diabetes mellitus with hyperglycemia (HCC)   Relevant Orders   POCT CBG (Fasting - Glucose) (Completed)   Hemoglobin A1c   Moderate late onset Alzheimer's dementia without behavioral disturbance, psychotic disturbance, mood disturbance, or anxiety (HCC)   Combined  hyperlipidemia associated with type 2 diabetes mellitus (HCC)   Relevant Orders   Lipid Panel w/o Chol/HDL Ratio    Return in about 4 months (around 04/26/2024).   Total time spent: 30 minutes  FERNAND FREDY RAMAN, MD  12/26/2023   This document may have been prepared by [facility] Voice Recognition software and as such may include unintentional dictation errors.

## 2023-12-27 LAB — CBC WITH DIFFERENTIAL/PLATELET
Basophils Absolute: 0.1 x10E3/uL (ref 0.0–0.2)
Basos: 1 %
EOS (ABSOLUTE): 0.1 x10E3/uL (ref 0.0–0.4)
Eos: 1 %
Hematocrit: 36.1 % (ref 34.0–46.6)
Hemoglobin: 11.8 g/dL (ref 11.1–15.9)
Immature Grans (Abs): 0 x10E3/uL (ref 0.0–0.1)
Immature Granulocytes: 0 %
Lymphocytes Absolute: 2.4 x10E3/uL (ref 0.7–3.1)
Lymphs: 29 %
MCH: 30.7 pg (ref 26.6–33.0)
MCHC: 32.7 g/dL (ref 31.5–35.7)
MCV: 94 fL (ref 79–97)
Monocytes Absolute: 0.5 x10E3/uL (ref 0.1–0.9)
Monocytes: 6 %
Neutrophils Absolute: 5.1 x10E3/uL (ref 1.4–7.0)
Neutrophils: 63 %
Platelets: 199 x10E3/uL (ref 150–450)
RBC: 3.84 x10E6/uL (ref 3.77–5.28)
RDW: 12.9 % (ref 11.7–15.4)
WBC: 8 x10E3/uL (ref 3.4–10.8)

## 2023-12-27 LAB — CMP14+EGFR
ALT: 40 IU/L — ABNORMAL HIGH (ref 0–32)
AST: 39 IU/L (ref 0–40)
Albumin: 3.9 g/dL (ref 3.7–4.7)
Alkaline Phosphatase: 63 IU/L (ref 44–121)
BUN/Creatinine Ratio: 17 (ref 12–28)
BUN: 19 mg/dL (ref 8–27)
Bilirubin Total: 0.6 mg/dL (ref 0.0–1.2)
CO2: 22 mmol/L (ref 20–29)
Calcium: 9.7 mg/dL (ref 8.7–10.3)
Chloride: 101 mmol/L (ref 96–106)
Creatinine, Ser: 1.12 mg/dL — ABNORMAL HIGH (ref 0.57–1.00)
Globulin, Total: 2.7 g/dL (ref 1.5–4.5)
Glucose: 183 mg/dL — ABNORMAL HIGH (ref 70–99)
Potassium: 4 mmol/L (ref 3.5–5.2)
Sodium: 137 mmol/L (ref 134–144)
Total Protein: 6.6 g/dL (ref 6.0–8.5)
eGFR: 48 mL/min/1.73 — ABNORMAL LOW (ref >59)

## 2023-12-27 LAB — LIPID PANEL W/O CHOL/HDL RATIO
Cholesterol, Total: 83 mg/dL — ABNORMAL LOW (ref 100–199)
HDL: 43 mg/dL (ref >39)
LDL Chol Calc (NIH): 21 mg/dL (ref 0–99)
Triglycerides: 101 mg/dL (ref 0–149)
VLDL Cholesterol Cal: 19 mg/dL (ref 5–40)

## 2023-12-27 LAB — HEMOGLOBIN A1C
Est. average glucose Bld gHb Est-mCnc: 263 mg/dL
Hgb A1c MFr Bld: 10.8 % — ABNORMAL HIGH (ref 4.8–5.6)

## 2024-01-16 ENCOUNTER — Other Ambulatory Visit: Payer: Self-pay | Admitting: Internal Medicine

## 2024-01-16 DIAGNOSIS — I420 Dilated cardiomyopathy: Secondary | ICD-10-CM

## 2024-01-16 MED ORDER — ONETOUCH VERIO FLEX SYSTEM W/DEVICE KIT: 1.0000 | PACK | Freq: Every day | 0 refills | Status: DC

## 2024-01-21 ENCOUNTER — Other Ambulatory Visit: Payer: Self-pay | Admitting: Internal Medicine

## 2024-01-21 DIAGNOSIS — E118 Type 2 diabetes mellitus with unspecified complications: Secondary | ICD-10-CM

## 2024-01-21 DIAGNOSIS — E782 Mixed hyperlipidemia: Secondary | ICD-10-CM

## 2024-01-25 ENCOUNTER — Other Ambulatory Visit: Payer: Self-pay | Admitting: Internal Medicine

## 2024-01-25 DIAGNOSIS — E118 Type 2 diabetes mellitus with unspecified complications: Secondary | ICD-10-CM

## 2024-02-02 ENCOUNTER — Telehealth: Payer: Self-pay | Admitting: Internal Medicine

## 2024-02-02 DIAGNOSIS — G301 Alzheimer's disease with late onset: Secondary | ICD-10-CM

## 2024-02-02 DIAGNOSIS — I5022 Chronic systolic (congestive) heart failure: Secondary | ICD-10-CM

## 2024-02-02 NOTE — Telephone Encounter (Signed)
 Patient's son, Garrel, called in requesting a referral to hospice care. Patient is declining health-wise and is having issues knowing when she needs to use the restroom and getting there on time. He said he thinks its time for hospice care and they need someone to come out ASAP. Please place referral if you feel appropriate.

## 2024-02-12 ENCOUNTER — Other Ambulatory Visit: Payer: Self-pay | Admitting: Internal Medicine

## 2024-02-12 DIAGNOSIS — E1165 Type 2 diabetes mellitus with hyperglycemia: Secondary | ICD-10-CM

## 2024-02-21 DEATH — deceased

## 2024-04-26 ENCOUNTER — Ambulatory Visit: Admitting: Internal Medicine
